# Patient Record
Sex: Female | Born: 1965 | Race: White | Hispanic: No | Marital: Single | Smoking: Current some day smoker
Health system: Southern US, Community
[De-identification: ages and names within clinical notes are randomized; demographics above are authoritative.]

## PROBLEM LIST (undated history)

## (undated) DIAGNOSIS — Z87442 Personal history of urinary calculi: Secondary | ICD-10-CM

## (undated) DIAGNOSIS — J449 Chronic obstructive pulmonary disease, unspecified: Secondary | ICD-10-CM

## (undated) DIAGNOSIS — E039 Hypothyroidism, unspecified: Secondary | ICD-10-CM

## (undated) DIAGNOSIS — F32A Depression, unspecified: Secondary | ICD-10-CM

## (undated) DIAGNOSIS — F329 Major depressive disorder, single episode, unspecified: Secondary | ICD-10-CM

## (undated) DIAGNOSIS — T8859XA Other complications of anesthesia, initial encounter: Secondary | ICD-10-CM

## (undated) DIAGNOSIS — Z780 Asymptomatic menopausal state: Secondary | ICD-10-CM

## (undated) DIAGNOSIS — D649 Anemia, unspecified: Secondary | ICD-10-CM

## (undated) DIAGNOSIS — T4145XA Adverse effect of unspecified anesthetic, initial encounter: Secondary | ICD-10-CM

## (undated) DIAGNOSIS — S060X9A Concussion with loss of consciousness of unspecified duration, initial encounter: Secondary | ICD-10-CM

## (undated) DIAGNOSIS — R51 Headache: Secondary | ICD-10-CM

## (undated) DIAGNOSIS — Z8719 Personal history of other diseases of the digestive system: Secondary | ICD-10-CM

## (undated) DIAGNOSIS — G459 Transient cerebral ischemic attack, unspecified: Secondary | ICD-10-CM

## (undated) DIAGNOSIS — M25559 Pain in unspecified hip: Secondary | ICD-10-CM

## (undated) DIAGNOSIS — N83209 Unspecified ovarian cyst, unspecified side: Secondary | ICD-10-CM

## (undated) DIAGNOSIS — K219 Gastro-esophageal reflux disease without esophagitis: Secondary | ICD-10-CM

## (undated) DIAGNOSIS — M1990 Unspecified osteoarthritis, unspecified site: Secondary | ICD-10-CM

## (undated) DIAGNOSIS — S060X1A Concussion with loss of consciousness of 30 minutes or less, initial encounter: Secondary | ICD-10-CM

## (undated) DIAGNOSIS — G4710 Hypersomnia, unspecified: Principal | ICD-10-CM

## (undated) DIAGNOSIS — S92909A Unspecified fracture of unspecified foot, initial encounter for closed fracture: Secondary | ICD-10-CM

## (undated) DIAGNOSIS — R413 Other amnesia: Secondary | ICD-10-CM

## (undated) HISTORY — PX: APPENDECTOMY: SHX54

## (undated) HISTORY — DX: Unspecified ovarian cyst, unspecified side: N83.209

## (undated) HISTORY — DX: Chronic obstructive pulmonary disease, unspecified: J44.9

## (undated) HISTORY — DX: Hypersomnia, unspecified: G47.10

## (undated) HISTORY — DX: Concussion with loss of consciousness of unspecified duration, initial encounter: S06.0X9A

## (undated) HISTORY — DX: Asymptomatic menopausal state: Z78.0

## (undated) HISTORY — PX: HEMORROIDECTOMY: SUR656

## (undated) HISTORY — DX: Concussion with loss of consciousness of 30 minutes or less, initial encounter: S06.0X1A

## (undated) HISTORY — DX: Pain in unspecified hip: M25.559

## (undated) HISTORY — DX: Other amnesia: R41.3

## (undated) HISTORY — PX: ABDOMINAL SURGERY: SHX537

## (undated) HISTORY — DX: Unspecified fracture of unspecified foot, initial encounter for closed fracture: S92.909A

---
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
## 1998-01-07 ENCOUNTER — Inpatient Hospital Stay (HOSPITAL_COMMUNITY): Admission: AD | Admit: 1998-01-07 | Discharge: 1998-01-07 | Payer: Self-pay | Admitting: Obstetrics & Gynecology

## 1998-08-07 ENCOUNTER — Ambulatory Visit (HOSPITAL_COMMUNITY): Admission: RE | Admit: 1998-08-07 | Discharge: 1998-08-07 | Payer: Self-pay | Admitting: *Deleted

## 1999-12-21 ENCOUNTER — Inpatient Hospital Stay (HOSPITAL_COMMUNITY): Admission: AD | Admit: 1999-12-21 | Discharge: 1999-12-21 | Payer: Self-pay | Admitting: *Deleted

## 1999-12-21 ENCOUNTER — Encounter: Payer: Self-pay | Admitting: *Deleted

## 2000-01-11 ENCOUNTER — Other Ambulatory Visit: Admission: RE | Admit: 2000-01-11 | Discharge: 2000-01-11 | Payer: Self-pay | Admitting: *Deleted

## 2000-02-21 ENCOUNTER — Ambulatory Visit (HOSPITAL_COMMUNITY): Admission: RE | Admit: 2000-02-21 | Discharge: 2000-02-21 | Payer: Self-pay | Admitting: Obstetrics and Gynecology

## 2000-02-21 ENCOUNTER — Encounter: Payer: Self-pay | Admitting: Obstetrics and Gynecology

## 2000-03-22 ENCOUNTER — Inpatient Hospital Stay (HOSPITAL_COMMUNITY): Admission: RE | Admit: 2000-03-22 | Discharge: 2000-03-22 | Payer: Self-pay | Admitting: Obstetrics and Gynecology

## 2001-03-15 ENCOUNTER — Encounter: Payer: Self-pay | Admitting: Internal Medicine

## 2001-03-15 ENCOUNTER — Encounter: Admission: RE | Admit: 2001-03-15 | Discharge: 2001-03-15 | Payer: Self-pay | Admitting: Internal Medicine

## 2001-04-29 ENCOUNTER — Emergency Department (HOSPITAL_COMMUNITY): Admission: EM | Admit: 2001-04-29 | Discharge: 2001-04-29 | Payer: Self-pay | Admitting: Emergency Medicine

## 2002-04-12 ENCOUNTER — Other Ambulatory Visit: Admission: RE | Admit: 2002-04-12 | Discharge: 2002-04-12 | Payer: Self-pay | Admitting: Obstetrics and Gynecology

## 2002-04-19 ENCOUNTER — Encounter: Payer: Self-pay | Admitting: Obstetrics and Gynecology

## 2002-04-19 ENCOUNTER — Ambulatory Visit (HOSPITAL_COMMUNITY): Admission: RE | Admit: 2002-04-19 | Discharge: 2002-04-19 | Payer: Self-pay | Admitting: Obstetrics and Gynecology

## 2002-05-31 ENCOUNTER — Ambulatory Visit (HOSPITAL_COMMUNITY): Admission: RE | Admit: 2002-05-31 | Discharge: 2002-05-31 | Payer: Self-pay | Admitting: Obstetrics and Gynecology

## 2002-05-31 ENCOUNTER — Encounter: Payer: Self-pay | Admitting: Obstetrics and Gynecology

## 2002-07-26 ENCOUNTER — Ambulatory Visit (HOSPITAL_COMMUNITY): Admission: RE | Admit: 2002-07-26 | Discharge: 2002-07-26 | Payer: Self-pay | Admitting: Obstetrics and Gynecology

## 2002-07-26 ENCOUNTER — Encounter: Payer: Self-pay | Admitting: Obstetrics and Gynecology

## 2003-08-20 ENCOUNTER — Other Ambulatory Visit: Admission: RE | Admit: 2003-08-20 | Discharge: 2003-08-20 | Payer: Self-pay | Admitting: Obstetrics and Gynecology

## 2004-08-27 ENCOUNTER — Other Ambulatory Visit: Admission: RE | Admit: 2004-08-27 | Discharge: 2004-08-27 | Payer: Self-pay | Admitting: Obstetrics and Gynecology

## 2005-09-15 ENCOUNTER — Other Ambulatory Visit: Admission: RE | Admit: 2005-09-15 | Discharge: 2005-09-15 | Payer: Self-pay | Admitting: Obstetrics and Gynecology

## 2006-03-03 ENCOUNTER — Encounter: Admission: RE | Admit: 2006-03-03 | Discharge: 2006-03-03 | Payer: Self-pay | Admitting: Internal Medicine

## 2006-07-10 ENCOUNTER — Encounter: Admission: RE | Admit: 2006-07-10 | Discharge: 2006-07-10 | Payer: Self-pay | Admitting: Gastroenterology

## 2006-09-26 ENCOUNTER — Other Ambulatory Visit: Admission: RE | Admit: 2006-09-26 | Discharge: 2006-09-26 | Payer: Self-pay | Admitting: Obstetrics and Gynecology

## 2007-11-16 ENCOUNTER — Other Ambulatory Visit: Admission: RE | Admit: 2007-11-16 | Discharge: 2007-11-16 | Payer: Self-pay | Admitting: Obstetrics and Gynecology

## 2007-11-26 ENCOUNTER — Encounter: Admission: RE | Admit: 2007-11-26 | Discharge: 2007-11-26 | Payer: Self-pay | Admitting: Obstetrics and Gynecology

## 2008-12-15 ENCOUNTER — Other Ambulatory Visit: Admission: RE | Admit: 2008-12-15 | Discharge: 2008-12-15 | Payer: Self-pay | Admitting: Obstetrics and Gynecology

## 2008-12-22 ENCOUNTER — Encounter: Admission: RE | Admit: 2008-12-22 | Discharge: 2008-12-22 | Payer: Self-pay | Admitting: Obstetrics and Gynecology

## 2009-12-17 ENCOUNTER — Encounter: Admission: RE | Admit: 2009-12-17 | Discharge: 2009-12-17 | Payer: Self-pay | Admitting: Chiropractic Medicine

## 2009-12-17 ENCOUNTER — Other Ambulatory Visit: Admission: RE | Admit: 2009-12-17 | Discharge: 2009-12-17 | Payer: Self-pay | Admitting: Obstetrics and Gynecology

## 2009-12-23 ENCOUNTER — Encounter: Admission: RE | Admit: 2009-12-23 | Discharge: 2009-12-23 | Payer: Self-pay | Admitting: Obstetrics and Gynecology

## 2010-06-07 ENCOUNTER — Ambulatory Visit (HOSPITAL_COMMUNITY): Admission: RE | Admit: 2010-06-07 | Discharge: 2010-06-07 | Payer: Self-pay | Admitting: Gastroenterology

## 2010-08-01 ENCOUNTER — Encounter: Payer: Self-pay | Admitting: Obstetrics and Gynecology

## 2011-03-01 ENCOUNTER — Other Ambulatory Visit: Payer: Self-pay | Admitting: Internal Medicine

## 2011-03-01 DIAGNOSIS — Z1231 Encounter for screening mammogram for malignant neoplasm of breast: Secondary | ICD-10-CM

## 2011-03-08 ENCOUNTER — Other Ambulatory Visit: Payer: Self-pay | Admitting: Obstetrics and Gynecology

## 2011-03-09 ENCOUNTER — Ambulatory Visit
Admission: RE | Admit: 2011-03-09 | Discharge: 2011-03-09 | Disposition: A | Discharge status: Home / Self Care | Payer: BC Managed Care – PPO | Source: Ambulatory Visit | Attending: Internal Medicine | Admitting: Internal Medicine

## 2011-03-09 DIAGNOSIS — Z1231 Encounter for screening mammogram for malignant neoplasm of breast: Secondary | ICD-10-CM

## 2012-03-20 ENCOUNTER — Emergency Department (HOSPITAL_COMMUNITY)
Admission: EM | Admit: 2012-03-20 | Discharge: 2012-03-20 | Disposition: A | Discharge status: Home / Self Care | Payer: Managed Care, Other (non HMO) | Attending: Emergency Medicine | Admitting: Emergency Medicine

## 2012-03-20 ENCOUNTER — Encounter (HOSPITAL_COMMUNITY): Payer: Self-pay

## 2012-03-20 ENCOUNTER — Emergency Department (HOSPITAL_COMMUNITY): Payer: Managed Care, Other (non HMO)

## 2012-03-20 ENCOUNTER — Encounter (HOSPITAL_COMMUNITY): Payer: Self-pay | Admitting: *Deleted

## 2012-03-20 ENCOUNTER — Emergency Department (HOSPITAL_COMMUNITY)
Admission: EM | Admit: 2012-03-20 | Discharge: 2012-03-20 | Disposition: A | Discharge status: Nursing Home (Includes ICF) | Payer: Managed Care, Other (non HMO) | Source: Home / Self Care | Attending: Family Medicine | Admitting: Family Medicine

## 2012-03-20 DIAGNOSIS — Z23 Encounter for immunization: Secondary | ICD-10-CM | POA: Insufficient documentation

## 2012-03-20 DIAGNOSIS — K219 Gastro-esophageal reflux disease without esophagitis: Secondary | ICD-10-CM | POA: Insufficient documentation

## 2012-03-20 DIAGNOSIS — F329 Major depressive disorder, single episode, unspecified: Secondary | ICD-10-CM | POA: Insufficient documentation

## 2012-03-20 DIAGNOSIS — S060X9A Concussion with loss of consciousness of unspecified duration, initial encounter: Secondary | ICD-10-CM

## 2012-03-20 DIAGNOSIS — F17200 Nicotine dependence, unspecified, uncomplicated: Secondary | ICD-10-CM | POA: Insufficient documentation

## 2012-03-20 DIAGNOSIS — S9030XA Contusion of unspecified foot, initial encounter: Secondary | ICD-10-CM | POA: Insufficient documentation

## 2012-03-20 DIAGNOSIS — Y92009 Unspecified place in unspecified non-institutional (private) residence as the place of occurrence of the external cause: Secondary | ICD-10-CM | POA: Insufficient documentation

## 2012-03-20 DIAGNOSIS — R51 Headache: Secondary | ICD-10-CM | POA: Insufficient documentation

## 2012-03-20 DIAGNOSIS — M25579 Pain in unspecified ankle and joints of unspecified foot: Secondary | ICD-10-CM | POA: Insufficient documentation

## 2012-03-20 DIAGNOSIS — S0083XA Contusion of other part of head, initial encounter: Secondary | ICD-10-CM

## 2012-03-20 DIAGNOSIS — W19XXXA Unspecified fall, initial encounter: Secondary | ICD-10-CM | POA: Insufficient documentation

## 2012-03-20 DIAGNOSIS — S0003XA Contusion of scalp, initial encounter: Secondary | ICD-10-CM | POA: Insufficient documentation

## 2012-03-20 DIAGNOSIS — F3289 Other specified depressive episodes: Secondary | ICD-10-CM | POA: Insufficient documentation

## 2012-03-20 DIAGNOSIS — E079 Disorder of thyroid, unspecified: Secondary | ICD-10-CM | POA: Insufficient documentation

## 2012-03-20 HISTORY — DX: Gastro-esophageal reflux disease without esophagitis: K21.9

## 2012-03-20 HISTORY — DX: Depression, unspecified: F32.A

## 2012-03-20 HISTORY — DX: Major depressive disorder, single episode, unspecified: F32.9

## 2012-03-20 MED ORDER — TETANUS-DIPHTH-ACELL PERTUSSIS 5-2.5-18.5 LF-MCG/0.5 IM SUSP
0.5000 mL | Freq: Once | INTRAMUSCULAR | Status: AC
  Administered 2012-03-20: 0.5 mL via INTRAMUSCULAR
  Filled 2012-03-20: qty 0.5

## 2012-03-20 NOTE — ED Notes (Signed)
Registration associate requested assessment of patient presenting with c/o possible concussion. Pt reports that she may have been somnambulant last Friday night when she believes that she fell in her kitchen and banging her face on a kitchen countertop, chipping a tooth in the process. Pt reports having felt "a little dizzy and disoriented" since then, but "worse this morning." Pt states that she was at her dentists office to address the chipped tooth, but was told by her dentist that there was a suspicion of the pt having suffered a concussion.  Pt also report that her dentist did not want to perform any procedure on her in her current state, and it was recommended that she "get checked." Pt's vital are stable at the time of this assessment.

## 2012-03-20 NOTE — Progress Notes (Signed)
Orthopedic Tech Progress Note Patient Details:  Monica Jensen 1965-11-05 [id number]  Ortho Devices Type of Ortho Device: Crutches;Postop boot Ortho Device/Splint Location: left foot Ortho Device/Splint Interventions: Application   Nikki Dom 03/20/2012, 5:36 PM

## 2012-03-20 NOTE — ED Notes (Signed)
Ortho at bedside.

## 2012-03-20 NOTE — ED Provider Notes (Signed)
History  This chart was scribed for [name], MD by Ladona Ridgel Day. This patient was seen in room TR04C/TR04C and the patient's care was started at 1412.   CSN: [id number]  Arrival date & time 03/20/12  1412   First MD Initiated Contact with Patient 03/20/12 1526      Chief Complaint  Patient presents with  . Fall  . Headache  . Foot Pain   Patient is a 46 y.o. female presenting with fall. The history is provided by the patient. No language interpreter was used.  Fall The accident occurred more than 2 days ago. The fall occurred while walking. She fell from an unknown height. She landed on a hard floor. There was no blood loss. The point of impact was the head. The pain is present in the head (right and left foot). The pain is moderate. She was ambulatory at the scene. There was no entrapment after the fall. Drug use: was taking ambien for sleeping. Pertinent negatives include no visual change, no fever, no abdominal pain, no nausea and no vomiting. The symptoms are aggravated by standing. She has tried nothing for the symptoms.   Monica Jensen is a 46 y.o. female who presents to the Emergency Department complaining of a fall 4 days ago while she was out of town and fell during the night while she was on [place] hitting her face and was found on the floor. She is unsure if she fell from height or the mechanism of her fall. She states headaches and abrasion above her right eye, chipping her front teeth, and left foot pain. She states that she has a history of sleep walking while taking ambien.    Past Medical History  Diagnosis Date  . Thyroid disease   . Depression   . GERD (gastroesophageal reflux disease)     Past Surgical History  Procedure Date  . Abdominal surgery   . Cesarean section   . Colon surgery     History reviewed. No pertinent family history.  History  Substance Use Topics  . Smoking status: Current Some Day Smoker  . Smokeless tobacco: Not on file  .  Alcohol Use: Yes    OB History    Grav Para Term Preterm Abortions TAB SAB Ect Mult Living                  Review of Systems  Constitutional: Negative for fever and chills.  Respiratory: Negative for shortness of breath.   Gastrointestinal: Negative for nausea, vomiting and abdominal pain.  Musculoskeletal: Negative for back pain.       Painful left and right foot  Skin:       Abrasion above her left eye.   Neurological: Negative for weakness.  All other systems reviewed and are negative.    Allergies  Penicillins  Home Medications   Current Outpatient Rx  Name Route Sig Dispense Refill  . CELEXA PO Oral Take 30 mg by mouth daily.    Marland Kitchen LEVOTHYROXINE SODIUM 75 MCG PO TABS Oral Take 75 mcg by mouth daily.    Marland Kitchen LITHIUM CARBONATE 300 MG PO TABS Oral Take 300 mg by mouth daily.    Marland Kitchen PANTOPRAZOLE SODIUM 20 MG PO TBEC Oral Take 20 mg by mouth daily.    Marland Kitchen ZOLPIDEM TARTRATE 10 MG PO TABS Oral Take 10 mg by mouth at bedtime as needed. For sleep      Triage vitals: BP 123/86  Pulse 70  Temp 98  F (36.7 C) (Oral)  Resp 16  SpO2 100%  LMP 02/27/2012  Physical Exam  Nursing note and vitals reviewed. Constitutional: She is oriented to person, place, and time. She appears well-developed and well-nourished. No distress.  HENT:  Head: Normocephalic and atraumatic.       Contusion medial aspect left orbit. 2 broken front teeth. No lac of lips.   Eyes: EOM are normal.  Neck: Neck supple. No tracheal deviation present.  Cardiovascular: Normal rate.   Pulmonary/Chest: Effort normal. No respiratory distress.  Musculoskeletal: Normal range of motion.       Bruised/contused and tender at base 5th metatarsal. Diffusely discolored c.w. contusion dorsal aspect bilateral feet.   Neurological: She is alert and oriented to person, place, and time.  Skin: Skin is warm and dry.  Psychiatric: She has a normal mood and affect. Her behavior is normal.    ED Course  Procedures (including  critical care time) DIAGNOSTIC STUDIES: Oxygen Saturation is 100% on room air, normal by my interpretation.    COORDINATION OF CARE: At 350 PM Discussed treatment plan with patient which includes C-spine and right foot X-Maude Hettich. Patient agrees.   455 PM Informed patient of negative right foot X-Chaysen Tillman and plan to discharge home and use ibuprofen and or Tylenol for pain. Patient understands and agrees.   Labs Reviewed - No data to display Ct Head Wo Contrast  03/20/2012  *RADIOLOGY REPORT*  Clinical Data: Status post fall with headaches  CT HEAD WITHOUT CONTRAST  Technique:  Contiguous axial images were obtained from the base of the skull through the vertex without contrast.  Comparison: None.  Findings: There is no midline shift, hydrocephalus, or mass effect. No acute hemorrhage or acute transcortical infarct is identified. The bony calvarium is intact.  The visualized sinuses are clear.  IMPRESSION: No focal acute intracranial abnormality identified.   Original Report Authenticated By: Sherian Rein, M.D.    Dg Foot Complete Left  03/20/2012  *RADIOLOGY REPORT*  Clinical Data: Status post fall with foot pain  LEFT FOOT - COMPLETE 3+ VIEW  Comparison: None.  Findings: There is no acute fracture or dislocation.  The soft tissues are normal.  IMPRESSION: No acute fracture or dislocation.   Original Report Authenticated By: Sherian Rein, M.D.      No diagnosis found.    MDM  I personally performed the services described in this documentation, which was scribed in my presence. The recorded information has been reviewed and considered.         [name], MD 03/20/12 [phone]

## 2012-03-20 NOTE — ED Notes (Signed)
Pt states she took [place] on Friday night.  States her friends found her in the kitchen floor and returned her to bed- she does not remember this.  She was out of town on vacation. States she has done this several times before when she has taken the [place].  C/o bruising and swelling to bridge of nose and knot above rt eyebrow, as well as headache, foggy thinking, fatigue and chipped front teeth.  Reports she is also having pain, bruising and swelling to rt foot.  No one witnessed the injuries.  She states she has been feeling worse since she returned home on Sunday.  Went to dentist today to have teeth repaired, dentist would not do anything until she is evaluated medically.

## 2012-03-20 NOTE — ED Notes (Signed)
Pain left foot 8/10 achy sharp and left foot 4/10 achy soreness with +1 swelling right foot. Pedal pulses +2 bilateral able to move all toes right foot and left foot able to move greater toe only.  Full sensation.

## 2012-03-20 NOTE — ED Notes (Addendum)
Pt reports on Friday night she took [place] and fell hitting face, front teeth chipped. Pt reports hx of sleep walking with ambien. Pt states she was at dentist and he told her she needed to get checked out before he would fix her teeth. Pt states she must of passed out after hitting the counter top. Pt report left foot pain and headaches. Pt sent over from urgent care for CT of head and foot xray. PERRLA. GCS 15.

## 2012-03-20 NOTE — ED Provider Notes (Addendum)
History     CSN: [id number]  Arrival date & time 03/20/12  1237   First MD Initiated Contact with Patient 03/20/12 1322      Chief Complaint  Patient presents with  . Head Injury  . Ankle Pain    (Consider location/radiation/quality/duration/timing/severity/associated sxs/prior treatment) Patient is a 46 y.o. female presenting with head injury and ankle pain. The history is provided by the patient.  Head Injury  The incident occurred more than 2 days ago (on fri night took an [place] for sleep, had sleep walking episode, fell, struck face, broke 2 teeth, had loc for possible 2 hrs,  bilat foot injury, thoughts not clear since, no vomiting, has fallen taking ambien 2 times prev.). She came to the ER via walk-in. The injury mechanism was a direct blow. She lost consciousness for a period of greater than 5 minutes. There was no blood loss. The pain is moderate.  Ankle Pain  The incident occurred more than 2 days ago. Incident location: out of town on vacation. The injury mechanism was a fall. The pain is present in the right foot and left foot.    Past Medical History  Diagnosis Date  . Thyroid disease   . Depression   . GERD (gastroesophageal reflux disease)     Past Surgical History  Procedure Date  . Abdominal surgery   . Cesarean section   . Colon surgery     No family history on file.  History  Substance Use Topics  . Smoking status: Current Some Day Smoker  . Smokeless tobacco: Not on file  . Alcohol Use: Yes    OB History    Grav Para Term Preterm Abortions TAB SAB Ect Mult Living                  Review of Systems  Constitutional: Negative.   HENT: Negative for neck pain.   Cardiovascular: Negative for chest pain.  Gastrointestinal: Negative.   Neurological: Positive for syncope and headaches. Negative for seizures and speech difficulty.    Allergies  Penicillins  Home Medications   Current Outpatient Rx  Name Route Sig Dispense Refill  . CELEXA  PO Oral Take 30 mg by mouth daily.    Marland Kitchen LEVOTHYROXINE SODIUM 75 MCG PO TABS Oral Take 75 mcg by mouth daily.    Marland Kitchen LITHIUM CARBONATE 300 MG PO TABS Oral Take 300 mg by mouth daily.    Marland Kitchen PANTOPRAZOLE SODIUM 20 MG PO TBEC Oral Take 20 mg by mouth daily.    . AMBIEN PO Oral Take by mouth.      BP 138/83  Pulse 75  Temp 99.6 F (37.6 C) (Oral)  Resp 19  SpO2 100%  LMP 02/27/2012  Physical Exam  Nursing note and vitals reviewed. Constitutional: She appears well-developed and well-nourished.  HENT:  Head: Normocephalic.  Right Ear: External ear normal.  Left Ear: External ear normal.  Mouth/Throat: Mucous membranes are normal.    Musculoskeletal: She exhibits tenderness.       Feet:    ED Course  Procedures (including critical care time)  Labs Reviewed - No data to display No results found.   1. Concussion with loss of consciousness for 1-24 hours       MDM          Linna Hoff, MD 03/20/12 1343  Linna Hoff, MD 03/20/12 1344

## 2012-06-08 ENCOUNTER — Emergency Department (HOSPITAL_COMMUNITY)
Admission: EM | Admit: 2012-06-08 | Discharge: 2012-06-09 | Disposition: A | Discharge status: Home / Self Care | Payer: Managed Care, Other (non HMO) | Attending: Emergency Medicine | Admitting: Emergency Medicine

## 2012-06-08 ENCOUNTER — Encounter (HOSPITAL_COMMUNITY): Payer: Self-pay | Admitting: Emergency Medicine

## 2012-06-08 DIAGNOSIS — Z8659 Personal history of other mental and behavioral disorders: Secondary | ICD-10-CM | POA: Insufficient documentation

## 2012-06-08 DIAGNOSIS — E079 Disorder of thyroid, unspecified: Secondary | ICD-10-CM | POA: Insufficient documentation

## 2012-06-08 DIAGNOSIS — Y929 Unspecified place or not applicable: Secondary | ICD-10-CM | POA: Insufficient documentation

## 2012-06-08 DIAGNOSIS — Y939 Activity, unspecified: Secondary | ICD-10-CM | POA: Insufficient documentation

## 2012-06-08 DIAGNOSIS — F10929 Alcohol use, unspecified with intoxication, unspecified: Secondary | ICD-10-CM

## 2012-06-08 DIAGNOSIS — R110 Nausea: Secondary | ICD-10-CM | POA: Insufficient documentation

## 2012-06-08 DIAGNOSIS — K219 Gastro-esophageal reflux disease without esophagitis: Secondary | ICD-10-CM | POA: Insufficient documentation

## 2012-06-08 DIAGNOSIS — W06XXXA Fall from bed, initial encounter: Secondary | ICD-10-CM | POA: Insufficient documentation

## 2012-06-08 DIAGNOSIS — R51 Headache: Secondary | ICD-10-CM | POA: Insufficient documentation

## 2012-06-08 DIAGNOSIS — R519 Headache, unspecified: Secondary | ICD-10-CM

## 2012-06-08 DIAGNOSIS — Z79899 Other long term (current) drug therapy: Secondary | ICD-10-CM | POA: Insufficient documentation

## 2012-06-08 DIAGNOSIS — F10229 Alcohol dependence with intoxication, unspecified: Secondary | ICD-10-CM | POA: Insufficient documentation

## 2012-06-08 LAB — URINALYSIS, ROUTINE W REFLEX MICROSCOPIC
Nitrite: NEGATIVE
Protein, ur: NEGATIVE mg/dL
Urobilinogen, UA: 0.2 mg/dL (ref 0.0–1.0)

## 2012-06-08 LAB — COMPREHENSIVE METABOLIC PANEL
ALT: 7 U/L (ref 0–35)
AST: 29 U/L (ref 0–37)
Albumin: 3.9 g/dL (ref 3.5–5.2)
BUN: 8 mg/dL (ref 6–23)
Calcium: 9.3 mg/dL (ref 8.4–10.5)
Chloride: 105 mEq/L (ref 96–112)
GFR calc Af Amer: >90 mL/min (ref >90)
GFR calc non Af Amer: 83 mL/min — ABNORMAL LOW (ref >90)
Glucose, Bld: 80 mg/dL (ref 70–99)
Total Bilirubin: 0.3 mg/dL (ref 0.3–1.2)

## 2012-06-08 LAB — CBC WITH DIFFERENTIAL/PLATELET
Hemoglobin: 14 g/dL (ref 12.0–15.0)
MCH: 30.7 pg (ref 26.0–34.0)
MCHC: 34.1 g/dL (ref 30.0–36.0)
MCV: 89.9 fL (ref 78.0–100.0)

## 2012-06-08 LAB — ETHANOL: Alcohol, Ethyl (B): 84 mg/dL — ABNORMAL HIGH (ref 0–11)

## 2012-06-08 LAB — POCT PREGNANCY, URINE: Preg Test, Ur: NEGATIVE

## 2012-06-08 MED ORDER — SODIUM CHLORIDE 0.9 % IV BOLUS (SEPSIS)
1000.0000 mL | Freq: Once | INTRAVENOUS | Status: AC
  Administered 2012-06-08: 1000 mL via INTRAVENOUS

## 2012-06-08 MED ORDER — KETOROLAC TROMETHAMINE 30 MG/ML IJ SOLN
30.0000 mg | Freq: Once | INTRAMUSCULAR | Status: AC
  Administered 2012-06-08: 30 mg via INTRAVENOUS
  Filled 2012-06-08: qty 1

## 2012-06-08 MED ORDER — SODIUM CHLORIDE 0.9 % IV SOLN
Freq: Once | INTRAVENOUS | Status: AC
  Administered 2012-06-08: via INTRAVENOUS

## 2012-06-08 MED ORDER — ACETAMINOPHEN 325 MG PO TABS
650.0000 mg | ORAL_TABLET | Freq: Once | ORAL | Status: AC
  Administered 2012-06-08: 650 mg via ORAL
  Filled 2012-06-08: qty 2

## 2012-06-08 NOTE — ED Notes (Addendum)
PT. FELL THIS MORNING AT HOME WHILE TRYING TO GET UP FROM BED TO GO TO BATHROOM  ,  DRANK ETOH LAST NIGHT , STATES SLEPT ALL DAY TODAY .ALERT AND ORIENTED , STATES HEADACHE  " SHAKY" WITH NAUSEA. SPEECH  CLEAR , NO FACIAL ASYMMETRY  , RESPIRATIONS UNLABORED.

## 2012-06-08 NOTE — ED Notes (Signed)
Lisette, PA in with patient.

## 2012-06-08 NOTE — ED Provider Notes (Signed)
History     CSN: [id number]  Arrival date & time 06/08/12  2101   First MD Initiated Contact with Patient 06/08/12 2243      Chief Complaint  Patient presents with  . Fall    (Consider location/radiation/quality/duration/timing/severity/associated sxs/prior treatment) HPI Comments: The 46 year old female with no significant past medical history presents emergency department complaining of headache.  Patient reports that last night she was drinking heavily and fell this morning when trying to go to restroom. Pt denies LOC and is unaware of hitting her head. Pt is not on blood thinners. Fall was onto carpeted floor from bed of about 3 feet. Headache described as a throbbing sensation with a severity of 8/10.  Today she has been sleeping until 630 and woke up with a headache.  Patient denies any emesis, change in vision, disequilibrium, ataxia, weakness, slurred speech, musculoskeletal pain, or difficulty breathing.   The history is provided by the patient.    Past Medical History  Diagnosis Date  . Thyroid disease   . Depression   . GERD (gastroesophageal reflux disease)     Past Surgical History  Procedure Date  . Abdominal surgery   . Cesarean section   . Colon surgery     No family history on file.  History  Substance Use Topics  . Smoking status: Current Some Day Smoker  . Smokeless tobacco: Not on file  . Alcohol Use: Yes    OB History    Grav Para Term Preterm Abortions TAB SAB Ect Mult Living                  Review of Systems  Constitutional: Negative for fever, chills and appetite change.  HENT: Negative for congestion.   Eyes: Negative for visual disturbance.  Respiratory: Negative for shortness of breath.   Cardiovascular: Negative for chest pain and leg swelling.  Gastrointestinal: Positive for nausea. Negative for vomiting and abdominal pain.  Genitourinary: Negative for dysuria, urgency and frequency.  Neurological: Positive for headaches.  Negative for dizziness, syncope, facial asymmetry, weakness, light-headedness and numbness.  Psychiatric/Behavioral: Negative for confusion.  All other systems reviewed and are negative.    Allergies  Penicillins  Home Medications   Current Outpatient Rx  Name  Route  Sig  Dispense  Refill  . CELEXA PO   Oral   Take 30 mg by mouth daily.         Marland Kitchen LEVOTHYROXINE SODIUM 75 MCG PO TABS   Oral   Take 75 mcg by mouth daily.         Marland Kitchen LITHIUM CARBONATE 300 MG PO TABS   Oral   Take 300 mg by mouth daily.         Marland Kitchen PANTOPRAZOLE SODIUM 20 MG PO TBEC   Oral   Take 20 mg by mouth daily.         Marland Kitchen ZOLPIDEM TARTRATE 10 MG PO TABS   Oral   Take 10 mg by mouth at bedtime as needed. For sleep           BP 101/76  Pulse 69  Temp 99.5 F (37.5 C) (Oral)  Resp 20  SpO2 93%  LMP 04/26/2012  Physical Exam  Nursing note and vitals reviewed. Constitutional: She is oriented to person, place, and time. She appears well-developed and well-nourished. No distress.  HENT:  Head: Normocephalic and atraumatic.  Eyes: Conjunctivae normal and EOM are normal. Pupils are equal, round, and reactive to light. No scleral icterus.  Neck: Normal range of motion.       Neck supple with no nuchal rigidity, full pain free ROM. No carotid bruit  Cardiovascular: Normal rate, regular rhythm, normal heart sounds and intact distal pulses.   Pulmonary/Chest: Effort normal and breath sounds normal. No respiratory distress.  Musculoskeletal: Normal range of motion.  Neurological: She is alert and oriented to person, place, and time. She has normal strength.       CN III-XII intact, good coordination, normal gait, strength 5/5 bilaterally, intact distal sensation.  Skin: Skin is warm and dry. No rash noted. She is not diaphoretic.       No rash, non diaphoretic  Psychiatric: She has a normal mood and affect. Her behavior is normal.    ED Course  Procedures (including critical care time)  Labs  Reviewed  URINALYSIS, ROUTINE W REFLEX MICROSCOPIC - Abnormal; Notable for the following:    APPearance CLOUDY (*)     All other components within normal limits  ETHANOL - Abnormal; Notable for the following:    Alcohol, Ethyl (B) 84 (*)     All other components within normal limits  COMPREHENSIVE METABOLIC PANEL - Abnormal; Notable for the following:    GFR calc non Af Amer 83 (*)     All other components within normal limits  CBC WITH DIFFERENTIAL  POCT PREGNANCY, URINE   No results found.   No diagnosis found.    MDM  Alc intoxication HA  Pt HA treated and improved while in ED.  Presentation non concerning for [facility], ICH, Meningitis, or temporal arteritis. Pt is afebrile with no focal neuro deficits, nuchal rigidity, or change in vision. Pt is to follow up with PCP to discuss prophylactic medication. Pt verbalizes understanding and is agreeable with plan to dc. Pt seen w Dr. Ignacia Palma who agrees w plan.          Jaci Carrel, [place] 06/09/12 [phone]

## 2012-06-09 NOTE — ED Notes (Signed)
Patient ambulated around the department independently with a steady gait.

## 2012-06-09 NOTE — ED Provider Notes (Signed)
Medical screening examination/treatment/procedure(s) were conducted as a shared visit with non-physician practitioner(s) and myself.  I personally evaluated the patient during the encounter 46 yo woman drank heavily last night fell This AM.  Slept all day, woke up this evening with headache.  Exam benign.  Lab tests show ETOH still above 80.  Safe to be released,  Carleene Cooper III, MD 06/09/12 1027

## 2013-05-02 ENCOUNTER — Other Ambulatory Visit: Payer: Self-pay | Admitting: Obstetrics and Gynecology

## 2013-07-29 ENCOUNTER — Other Ambulatory Visit: Payer: Self-pay | Admitting: Gastroenterology

## 2013-08-05 ENCOUNTER — Encounter (HOSPITAL_COMMUNITY): Payer: Self-pay | Admitting: Pharmacy Technician

## 2013-08-15 ENCOUNTER — Encounter (HOSPITAL_COMMUNITY): Payer: Self-pay | Admitting: *Deleted

## 2013-08-30 ENCOUNTER — Other Ambulatory Visit: Payer: Self-pay | Admitting: Internal Medicine

## 2013-08-30 DIAGNOSIS — F0781 Postconcussional syndrome: Secondary | ICD-10-CM

## 2013-09-09 ENCOUNTER — Ambulatory Visit (INDEPENDENT_AMBULATORY_CARE_PROVIDER_SITE_OTHER): Payer: Managed Care, Other (non HMO) | Admitting: Neurology

## 2013-09-09 ENCOUNTER — Encounter: Payer: Self-pay | Admitting: Neurology

## 2013-09-09 ENCOUNTER — Encounter (INDEPENDENT_AMBULATORY_CARE_PROVIDER_SITE_OTHER): Payer: Self-pay

## 2013-09-09 VITALS — BP 108/72 | HR 67 | Resp 18 | Ht 63.5 in | Wt 110.0 lb

## 2013-09-09 DIAGNOSIS — G4710 Hypersomnia, unspecified: Secondary | ICD-10-CM

## 2013-09-09 DIAGNOSIS — F332 Major depressive disorder, recurrent severe without psychotic features: Secondary | ICD-10-CM

## 2013-09-09 DIAGNOSIS — F489 Nonpsychotic mental disorder, unspecified: Secondary | ICD-10-CM

## 2013-09-09 DIAGNOSIS — F5105 Insomnia due to other mental disorder: Secondary | ICD-10-CM

## 2013-09-09 HISTORY — DX: Hypersomnia, unspecified: G47.10

## 2013-09-09 NOTE — Patient Instructions (Signed)
iInsomnia Insomnia is frequent trouble falling and/or staying asleep. Insomnia can be a long term problem or a short term problem. Both are common. Insomnia can be a short term problem when the wakefulness is related to a certain stress or worry. Long term insomnia is often related to ongoing stress during waking hours and/or poor sleeping habits. Overtime, sleep deprivation itself can make the problem worse. Every little thing feels more severe because you are overtired and your ability to cope is decreased. CAUSES   Stress, anxiety, and depression.  Poor sleeping habits.  Distractions such as TV in the bedroom.  Naps close to bedtime.  Engaging in emotionally charged conversations before bed.  Technical reading before sleep.  Alcohol and other sedatives. They may make the problem worse. They can hurt normal sleep patterns and normal dream activity.  Stimulants such as caffeine for several hours prior to bedtime.  Pain syndromes and shortness of breath can cause insomnia.  Exercise late at night.  Changing time zones may cause sleeping problems (jet lag). It is sometimes helpful to have someone observe your sleeping patterns. They should look for periods of not breathing during the night (sleep apnea). They should also look to see how long those periods last. If you live alone or observers are uncertain, you can also be observed at a sleep clinic where your sleep patterns will be professionally monitored. Sleep apnea requires a checkup and treatment. Give your caregivers your medical history. Give your caregivers observations your family has made about your sleep.  SYMPTOMS   Not feeling rested in the morning.  Anxiety and restlessness at bedtime.  Difficulty falling and staying asleep. TREATMENT   Your caregiver may prescribe treatment for an underlying medical disorders. Your caregiver can give advice or help if you are using alcohol or other drugs for self-medication. Treatment  of underlying problems will usually eliminate insomnia problems.  Medications can be prescribed for short time use. They are generally not recommended for lengthy use.  Over-the-counter sleep medicines are not recommended for lengthy use. They can be habit forming.  You can promote easier sleeping by making lifestyle changes such as:  Using relaxation techniques that help with breathing and reduce muscle tension.  Exercising earlier in the day.  Changing your diet and the time of your last meal. No night time snacks.  Establish a regular time to go to bed.  Counseling can help with stressful problems and worry.  Soothing music and white noise may be helpful if there are background noises you cannot remove.  Stop tedious detailed work at least one hour before bedtime. HOME CARE INSTRUCTIONS   Keep a diary. Inform your caregiver about your progress. This includes any medication side effects. See your caregiver regularly. Take note of:  Times when you are asleep.  Times when you are awake during the night.  The quality of your sleep.  How you feel the next day. This information will help your caregiver care for you.  Get out of bed if you are still awake after 15 minutes. Read or do some quiet activity. Keep the lights down. Wait until you feel sleepy and go back to bed.  Keep regular sleeping and waking hours. Avoid naps.  Exercise regularly.  Avoid distractions at bedtime. Distractions include watching television or engaging in any intense or detailed activity like attempting to balance the household checkbook.  Develop a bedtime ritual. Keep a familiar routine of bathing, brushing your teeth, climbing into bed at the same  time each night, listening to soothing music. Routines increase the success of falling to sleep faster.  Use relaxation techniques. This can be using breathing and muscle tension release routines. It can also include visualizing peaceful scenes. You can  also help control troubling or intruding thoughts by keeping your mind occupied with boring or repetitive thoughts like the old concept of counting sheep. You can make it more creative like imagining planting one beautiful flower after another in your backyard garden.  During your day, work to eliminate stress. When this is not possible use some of the previous suggestions to help reduce the anxiety that accompanies stressful situations. MAKE SURE YOU:   Understand these instructions.  Will watch your condition.  Will get help right away if you are not doing well or get worse. Document Released: 06/24/2000 Document Revised: 09/19/2011 Document Reviewed: 07/25/2007 [employer] Patient Information 2014 [employer].  

## 2013-09-09 NOTE — Progress Notes (Signed)
[facility]  Provider:  Melvyn Jensen, M D  Referring Provider: Marden Noble, MD Primary Care Physician:  Monica Dubonnet, MD    HYPERSOMNIA for 6 month.   HPI:  Monica Jensen is a 48 y.o. female  Is seen here as a referral   from Dr. Kevan Jensen for evaluation of hypersomnia.  Steps, and a 33 year old Caucasian right-handed female is to be evaluated today for a complaint of hypersomnia. She also reports that she feels that her memory has been suffering that she is excessively fatigued and that she feels she has trouble to concentrate and pay attention. This all may be related to the hypersomnolence. About 6 months ago and she hasn't felt quite as sleepy as she feels now and she states that is very unusual for her to be sore fatigued and sore feeling slow and and heavy . She also states that she was always a very energetic person that many of her friends referred to as "the energizer bunny ".  She further reports that she had several concussions in the past, the last  one was January 2015 in a skiing accident. She lost briefly consciousness and she lost about 90 minutes of memory prior to the accident. Today's fatigue severity score is endorsed at 48 points , and her sleepiness score at 12 points.  A depression score was not obtained.   The patient is in psychiatric treatment with Dr. Dub Jensen , treated with Lithium. She ran out of her Lithium just 5 days ago and seems to show signs of early hypomania. She just lost her way a cuple of weeks ago on a familiar road , missed several exits and suffered a panic attack - she could not recall which exit to take to get home.   The patient's fiancee reports that she snores rarely. Ambien taken daily.  Goes to bed around midnight to 1 AM and rises at 7 AM . She only sleeps about 6 hours .She falls asleep in supine and wakes up prone.  She has had sleep initiation insomnia , family history in mother and maternal  grandmother , who both stayed up at 3 AM and slept until 7 AM.  Her Son ( 13 ) has ADHD , OCD and ODD. Sometimes, she  feels sleepy and wants to take a nap at her disk. She works for the [employer], training and education . She works some evenings and week ends. Most days 9 AM  to 5 PM. She travels for her job . Drives a lot.  She drinks excessive caffeine, 4 mugs  of coffee , mountain dew, no iced tea. She drinks alcohol, states she does not drink when she has her son in custody ( every other week end). Former smoker- from age 41 to 67. She is a long distance runner and reports no SOB. She has palpitations.    Review of Systems: Out of a complete 14 system review, the patient complains of only the following symptoms, and all other reviewed systems are negative.  Insomnia chronic, daytime EDS , Epworth 12 , FSS 48.  History   Social History  . Marital Status: Single    Spouse Name: N/A    Number of Children: 1  . Years of Education: College   Occupational History  .     Social History Main Topics  . Smoking status: Current Some Day Smoker    Types: Cigarettes  . Smokeless tobacco: Never Used  Comment: 1 pk per week-social  . Alcohol Use: Yes     Comment: social  . Drug Use: No  . Sexual Activity: Yes   Other Topics Concern  . Not on file   Social History Narrative   Patient lives with her fianceeLaure Jensen) and her son.   Patient has one child.   Patient has college education.   Patient works at [employer] (full-time).   Patient is right-handed.   Patient drinks four cups of caffeine per day.    Family History  Problem Relation Age of Onset  . Heart disease Maternal Grandfather   . Heart disease Maternal Grandmother   . Lung cancer Maternal Grandmother     Past Medical History  Diagnosis Date  . Thyroid disease   . Depression   . GERD (gastroesophageal reflux disease)   . Hypothyroidism   . Headache(784.0)     migraines  . Anemia   .  Complication of anesthesia     Epinephrine"tachycardia"-dentist  . History of Jensen stones     passed  . Concussion with brief LOC   . Memory loss   . 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 Fears memory loss. Questions  About  Recent memory, loss related to [place].     Past Surgical History  Procedure Laterality Date  . Abdominal surgery      x2(laparoscopic, 1 open)  . Cesarean section    . Colon surgery      "blockage"    Current Outpatient Prescriptions  Medication Sig Dispense Refill  . citalopram (CELEXA) 20 MG tablet Take 40 mg by mouth daily.      Marland Kitchen GAVILYTE-N WITH FLAVOR PACK 420 G solution       . hydrOXYzine (VISTARIL) 25 MG capsule       . ibuprofen (ADVIL,MOTRIN) 200 MG tablet Take 600 mg by mouth every 6 (six) hours as needed.      Marland Kitchen levothyroxine (SYNTHROID, LEVOTHROID) 88 MCG tablet Take 88 mcg by mouth daily before breakfast.      . lithium 300 MG tablet Take 300 mg by mouth every morning.       . pantoprazole (PROTONIX) 40 MG tablet Take 40 mg by mouth daily.      Marland Kitchen zolpidem (AMBIEN) 10 MG tablet Take 10 mg by mouth at bedtime as needed. For sleep       No current facility-administered medications for this visit.    Allergies as of 09/09/2013 - Review Complete 09/09/2013  Allergen Reaction Noted  . Penicillins Anaphylaxis 03/20/2012  . Cheese Other (See Comments) 08/05/2013  . Chocolate Other (See Comments) 08/05/2013  . Other Other (See Comments) 08/05/2013  . Epinephrine Palpitations 09/09/2013    Vitals: BP 108/72  Pulse 67  Resp 18  Ht 5' 3.5" (1.613 m)  Wt 110 lb (49.896 kg)  BMI 19.18 kg/m2  LMP 09/03/2013 Last Weight:  Wt Readings from Last 1 Encounters:  09/09/13 110 lb (49.896 kg)   Last Height:   Ht Readings from Last 1 Encounters:  09/09/13 5' 3.5" (1.613 m)     Physical exam:  General: The patient is awake, alert and appears not in  acute distress. The patient is well groomed. Head: Normocephalic, atraumatic. Neck is supple. Mallampati 2 , neck circumference: 13,  Cardiovascular:  Regular rate and rhythm, without  murmurs or carotid bruit, and without distended neck veins.  Respiratory: Lungs are clear to auscultation. Skin:  Without evidence of edema, or rash Trunk: BMI is elevated and patient  has normal posture.  Neurologic exam : The patient is awake and alert, oriented to place and time.  Memory subjective described as intact.  There is a normal attention span & concentration ability. Speech is fluent without  dysarthria, dysphonia or aphasia. Mood and affect are appropriate.  Cranial nerves: Pupils are equal and briskly reactive to light. Funduscopic exam without  evidence of pallor or edema. Extraocular movements  in vertical and horizontal planes intact and without nystagmus. Visual fields by finger perimetry are intact. Hearing to finger rub intact.  Facial sensation intact to fine touch. Facial motor strength is symmetric and tongue and uvula move midline.  Motor exam:   Normal tone and normal muscle bulk and symmetric normal strength in all extremities.  Sensory:  Fine touch, pinprick and vibration were tested in all extremities. Proprioception is intact .  Coordination: Rapid alternating movements in the fingers/hands is tested and normal. Finger-to-nose maneuver tested and normal without evidence of ataxia, dysmetria - mild  tremor.  Gait and station: Patient walks without assistive device . Strength within normal limits. Stance is stable and normal. Tandem gait is unfragmented. Romberg testing is normal.  Deep tendon reflexes: in the  upper and lower extremities are symmetric and intact. Babinski maneuver response is  downgoing.   Assessment:  After physical and neurologic examination, review of laboratory studies, imaging, neurophysiology testing and pre-existing records, assessment is   Poor sleep  hygiene.  Chronic insomnia , Ambien dependent . Sleep walking on Ambien.  Poor concentration and attention- needs psychiatrist to refer for ADD evaluation and needs refills of Lithium. She will see Dr. Lucianne MussKumar.  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 Patient has no reported risk factors for sleep apnea.       Plan:  Treatment plan and additional workup : The patient's main problems are non organic sleep disorders, she has been using [place] for decades.  i will recommend to return to psychiatry ASAP, resume Lithium until seen by psychiatry and undergo ADD testing.  There is anecdotal evidence of Aricept being helpful in post concussion syndrome. I recommend to use Trazodone or low dose Seroquel under guidance of a psychiatrist. .

## 2013-09-10 ENCOUNTER — Ambulatory Visit
Admission: RE | Admit: 2013-09-10 | Discharge: 2013-09-10 | Disposition: A | Discharge status: Home / Self Care | Payer: Managed Care, Other (non HMO) | Source: Ambulatory Visit | Attending: Internal Medicine | Admitting: Internal Medicine

## 2013-09-10 DIAGNOSIS — F0781 Postconcussional syndrome: Secondary | ICD-10-CM

## 2013-10-01 ENCOUNTER — Encounter (HOSPITAL_COMMUNITY): Payer: Self-pay | Admitting: Certified Registered Nurse Anesthetist

## 2013-10-01 ENCOUNTER — Ambulatory Visit (HOSPITAL_COMMUNITY)
Admission: RE | Admit: 2013-10-01 | Discharge: 2013-10-01 | Disposition: A | Discharge status: Home / Self Care | Payer: Managed Care, Other (non HMO) | Source: Ambulatory Visit | Attending: Gastroenterology | Admitting: Gastroenterology

## 2013-10-01 ENCOUNTER — Encounter (HOSPITAL_COMMUNITY)
Admission: RE | Disposition: A | Discharge status: Home / Self Care | Payer: Self-pay | Source: Ambulatory Visit | Attending: Gastroenterology

## 2013-10-01 ENCOUNTER — Ambulatory Visit (HOSPITAL_COMMUNITY): Payer: Managed Care, Other (non HMO) | Admitting: Certified Registered Nurse Anesthetist

## 2013-10-01 ENCOUNTER — Encounter (HOSPITAL_COMMUNITY): Payer: Managed Care, Other (non HMO) | Admitting: Certified Registered Nurse Anesthetist

## 2013-10-01 DIAGNOSIS — Z978 Presence of other specified devices: Secondary | ICD-10-CM | POA: Insufficient documentation

## 2013-10-01 DIAGNOSIS — K589 Irritable bowel syndrome without diarrhea: Secondary | ICD-10-CM | POA: Insufficient documentation

## 2013-10-01 DIAGNOSIS — E039 Hypothyroidism, unspecified: Secondary | ICD-10-CM | POA: Insufficient documentation

## 2013-10-01 DIAGNOSIS — F329 Major depressive disorder, single episode, unspecified: Secondary | ICD-10-CM | POA: Insufficient documentation

## 2013-10-01 DIAGNOSIS — F17200 Nicotine dependence, unspecified, uncomplicated: Secondary | ICD-10-CM | POA: Insufficient documentation

## 2013-10-01 DIAGNOSIS — G4700 Insomnia, unspecified: Secondary | ICD-10-CM | POA: Insufficient documentation

## 2013-10-01 DIAGNOSIS — F3289 Other specified depressive episodes: Secondary | ICD-10-CM | POA: Insufficient documentation

## 2013-10-01 DIAGNOSIS — Z87442 Personal history of urinary calculi: Secondary | ICD-10-CM | POA: Insufficient documentation

## 2013-10-01 DIAGNOSIS — D649 Anemia, unspecified: Secondary | ICD-10-CM | POA: Insufficient documentation

## 2013-10-01 DIAGNOSIS — K5900 Constipation, unspecified: Secondary | ICD-10-CM | POA: Insufficient documentation

## 2013-10-01 DIAGNOSIS — Z9089 Acquired absence of other organs: Secondary | ICD-10-CM | POA: Insufficient documentation

## 2013-10-01 DIAGNOSIS — K921 Melena: Secondary | ICD-10-CM | POA: Insufficient documentation

## 2013-10-01 DIAGNOSIS — K648 Other hemorrhoids: Secondary | ICD-10-CM | POA: Insufficient documentation

## 2013-10-01 DIAGNOSIS — K219 Gastro-esophageal reflux disease without esophagitis: Secondary | ICD-10-CM | POA: Insufficient documentation

## 2013-10-01 DIAGNOSIS — G43909 Migraine, unspecified, not intractable, without status migrainosus: Secondary | ICD-10-CM | POA: Insufficient documentation

## 2013-10-01 DIAGNOSIS — K5730 Diverticulosis of large intestine without perforation or abscess without bleeding: Secondary | ICD-10-CM | POA: Insufficient documentation

## 2013-10-01 DIAGNOSIS — J310 Chronic rhinitis: Secondary | ICD-10-CM | POA: Insufficient documentation

## 2013-10-01 HISTORY — DX: Adverse effect of unspecified anesthetic, initial encounter: T41.45XA

## 2013-10-01 HISTORY — DX: Hypothyroidism, unspecified: E03.9

## 2013-10-01 HISTORY — DX: Anemia, unspecified: D64.9

## 2013-10-01 HISTORY — DX: Headache: R51

## 2013-10-01 HISTORY — DX: Other complications of anesthesia, initial encounter: T88.59XA

## 2013-10-01 HISTORY — PX: COLONOSCOPY WITH PROPOFOL: SHX5780

## 2013-10-01 HISTORY — DX: Personal history of urinary calculi: Z87.442

## 2013-10-01 SURGERY — COLONOSCOPY WITH PROPOFOL
Anesthesia: Monitor Anesthesia Care

## 2013-10-01 MED ORDER — FENTANYL CITRATE 0.05 MG/ML IJ SOLN
INTRAMUSCULAR | Status: AC
  Filled 2013-10-01: qty 2

## 2013-10-01 MED ORDER — MIDAZOLAM HCL 5 MG/5ML IJ SOLN
INTRAMUSCULAR | Status: DC | PRN
  Administered 2013-10-01 (×2): 1 mg via INTRAVENOUS

## 2013-10-01 MED ORDER — SODIUM CHLORIDE 0.9 % IV SOLN: INTRAVENOUS | Status: DC

## 2013-10-01 MED ORDER — PROPOFOL 10 MG/ML IV BOLUS
INTRAVENOUS | Status: AC
Start: 2013-10-01 — End: 2013-10-01
  Filled 2013-10-01: qty 20

## 2013-10-01 MED ORDER — LACTATED RINGERS IV SOLN
INTRAVENOUS | Status: DC | PRN
  Administered 2013-10-01: 07:00:00 via INTRAVENOUS

## 2013-10-01 MED ORDER — MIDAZOLAM HCL 2 MG/2ML IJ SOLN
INTRAMUSCULAR | Status: AC
  Filled 2013-10-01: qty 2

## 2013-10-01 MED ORDER — PROPOFOL 10 MG/ML IV BOLUS
INTRAVENOUS | Status: AC
  Filled 2013-10-01: qty 20

## 2013-10-01 MED ORDER — PROPOFOL 10 MG/ML IV BOLUS
INTRAVENOUS | Status: DC | PRN
  Administered 2013-10-01: 40 mg via INTRAVENOUS
  Administered 2013-10-01 (×2): 20 mg via INTRAVENOUS
  Administered 2013-10-01 (×2): 40 mg via INTRAVENOUS

## 2013-10-01 SURGICAL SUPPLY — 21 items

## 2013-10-01 NOTE — H&P (Signed)
  Problem: Hematochezia  History: The patient is a 48 year old female born 01/29/1966. She has chronic anal rectal discomfort associated with intermittent hematochezia. She denies diarrhea and constipation.  In 2007, she underwent a normal incomplete screening colonoscopy followed by a normal virtual colonoscopy.  In November 2011, she underwent a normal flexible proctosigmoidoscopy.  The patient is scheduled to undergo a diagnostic colonoscopy using propofol sedation.  Past medical history: Gastroesophageal reflux. Irritable bowel syndrome. Insomnia. Depression. Migraine headache syndrome. Rhinitis. Kidney stones. Colonic diverticulosis. Appendectomy. Breast augmentation. Cesarean section. Fallopian tube removal. Exploratory abdominal surgery.  Allergies: Penicillin. Effexor causes severe depression. Ambien causes sleep walking.  Exam: The patient is alert and lying comfortably on the endoscopy stretcher. Cardiac exam reveals a regular rhythm. Lungs are clear to auscultation. Abdomen is soft and nontender to palpation.  Plan: Proceed with diagnostic colonoscopy to evaluate chronic anal rectal discomfort with intermittent hematochezia.

## 2013-10-01 NOTE — Op Note (Signed)
Problem: Chronic anorectal discomfort associated with intermittent hematochezia. 2007 normal incomplete diagnostic colonoscopy to 40 cm followed by a normal screening virtual colonoscopy. 2011 normal flexible proctosigmoidoscopy.  Endoscopist: Danise EdgeMartin Johnson  Premedication: Propofol administered by anesthesia  Procedure: Diagnostic colonoscopy The patient was placed in the left lateral decubitus position. Anal inspection and digital rectal exam were normal. The Pentax pediatric colonoscope was introduced into the rectum and advanced to the cecum. A normal-appearing appendiceal orifice and ileocecal valve were identified. Colonic preparation for the exam today was good.  Rectum. Normal. Retroflex view of the distal rectum showed moderate sized, nonbleeding internal hemorrhoids.  Sigmoid colon and descending colon. Normal  Splenic flexure. Normal  Transverse colon. Normal  Hepatic flexure. Normal  Ascending colon. Normal. Cecum and ileocecal valve. Normal  Assessment: Normal diagnostic proctocolonoscopy to the cecum except for the presence of moderate sized, nonbleeding internal hemorrhoids.  Recommendation: Schedule surgical consultation to treat moderate sized internal hemorrhoids which may be the source of her intermittent hematochezia

## 2013-10-01 NOTE — Discharge Instructions (Addendum)
Moderate Sedation, Adult °Moderate sedation is given to help you relax or even sleep through a procedure. You may remain sleepy, be clumsy, or have poor balance for several hours following this procedure. Arrange for a responsible adult, family member, or friend to take you home. A responsible adult should stay with you for at least 24 hours or until the medicines have worn off. °· Do not participate in any activities where you could become injured for the next 24 hours, or until you feel normal again. Do not: °· Drive. °· Swim. °· Ride a bicycle. °· Operate heavy machinery. °· Cook. °· Use power tools. °· Climb ladders. °· Work at heights. °· Do not make important decisions or sign legal documents until you are improved. °· Vomiting may occur if you eat too soon. When you can drink without vomiting, try water, juice, or soup. Try solid foods if you feel little or no nausea. °· Only take over-the-counter or prescription medications for pain, discomfort, or fever as directed by your caregiver.If pain medications have been prescribed for you, ask your caregiver how soon it is safe to take them. °· Make sure you and your family fully understands everything about the medication given to you. Make sure you understand what side effects may occur. °· You should not drink alcohol, take sleeping pills, or medications that cause drowsiness for at least 24 hours. °· If you smoke, do not smoke alone. °· If you are feeling better, you may resume normal activities 24 hours after receiving sedation. °· Keep all appointments as scheduled. Follow all instructions. °· Ask questions if you do not understand. °SEEK MEDICAL CARE IF:  °· Your skin is pale or bluish in color. °· You continue to feel sick to your stomach (nauseous) or throw up (vomit). °· Your pain is getting worse and not helped by medication. °· You have bleeding or swelling. °· You are still sleepy or feeling clumsy after 24 hours. °SEEK IMMEDIATE MEDICAL CARE IF:   °· You develop a rash. °· You have difficulty breathing. °· You develop any type of allergic problem. °· You have a fever. °Document Released: 03/22/2001 Document Revised: 09/19/2011 Document Reviewed: 03/04/2013 °Exit[employer]® Patient Information ©2014 [employer]. ° °

## 2013-10-01 NOTE — Anesthesia Postprocedure Evaluation (Signed)
  Anesthesia Post-op Note  Patient: Monica Jensen  Procedure(s) Performed: Procedure(s) (LRB): COLONOSCOPY WITH PROPOFOL (N/A)  Patient Location: PACU  Anesthesia Type: MAC  Level of Consciousness: awake and alert   Airway and Oxygen Therapy: Patient Spontanous Breathing  Post-op Pain: mild  Post-op Assessment: Post-op Vital signs reviewed, Patient's Cardiovascular Status Stable, Respiratory Function Stable, Patent Airway and No signs of Nausea or vomiting  Last Vitals:  Filed Vitals:   10/01/13 0910  BP: 116/77  Temp:   Resp: 14    Post-op Vital Signs: stable   Complications: No apparent anesthesia complications

## 2013-10-01 NOTE — Preoperative (Signed)
Beta Blockers   Reason not to administer Beta Blockers:Not Applicable 

## 2013-10-01 NOTE — Anesthesia Preprocedure Evaluation (Addendum)
Anesthesia Evaluation  Patient identified by MRN, date of birth, ID band Patient awake    Reviewed: Allergy & Precautions, H&P , NPO status , Patient's Chart, lab work & pertinent test results  History of Anesthesia Complications Negative for: history of anesthetic complications  Airway Mallampati: II TM Distance: >3 FB Neck ROM: Full    Dental  (+) Dental Advisory Given   Pulmonary Current Smoker,  breath sounds clear to auscultation        Cardiovascular negative cardio ROS  Rhythm:Regular Rate:Normal     Neuro/Psych  Headaches, PSYCHIATRIC DISORDERS Depression negative neurological ROS  negative psych ROS   GI/Hepatic Neg liver ROS, GERD-  Medicated,  Endo/Other  Hypothyroidism   Renal/GU negative Renal ROS  negative genitourinary   Musculoskeletal negative musculoskeletal ROS (+)   Abdominal   Peds negative pediatric ROS (+)  Hematology negative hematology ROS (+) anemia ,   Anesthesia Other Findings   Reproductive/Obstetrics negative OB ROS                          Anesthesia Physical Anesthesia Plan  ASA: II  Anesthesia Plan: MAC   Post-op Pain Management:    Induction: Intravenous  Airway Management Planned:   Additional Equipment:   Intra-op Plan:   Post-operative Plan:   Informed Consent: I have reviewed the patients History and Physical, chart, labs and discussed the procedure including the risks, benefits and alternatives for the proposed anesthesia with the patient or authorized representative who has indicated his/her understanding and acceptance.   Dental advisory given  Plan Discussed with: CRNA  Anesthesia Plan Comments:         Anesthesia Quick Evaluation

## 2013-10-01 NOTE — Transfer of Care (Signed)
Immediate Anesthesia Transfer of Care Note  Patient: Monica Jensen  Procedure(s) Performed: Procedure(s): COLONOSCOPY WITH PROPOFOL (N/A)  Patient Location: PACU and Endoscopy Unit  Anesthesia Type:MAC  Level of Consciousness: awake, oriented, patient cooperative, lethargic and responds to stimulation  Airway & Oxygen Therapy: Patient Spontanous Breathing and Patient connected to face mask oxygen  Post-op Assessment: Report given to PACU RN, Post -op Vital signs reviewed and stable and Patient moving all extremities  Post vital signs: Reviewed and stable  Complications: No apparent anesthesia complications

## 2013-10-02 ENCOUNTER — Encounter (HOSPITAL_COMMUNITY): Payer: Self-pay | Admitting: Gastroenterology

## 2013-10-08 ENCOUNTER — Ambulatory Visit (HOSPITAL_COMMUNITY): Payer: Managed Care, Other (non HMO) | Admitting: Psychiatry

## 2013-10-08 VITALS — BP 111/83 | HR 69 | Ht 63.0 in | Wt 108.4 lb

## 2013-10-08 DIAGNOSIS — F1010 Alcohol abuse, uncomplicated: Secondary | ICD-10-CM

## 2013-10-08 DIAGNOSIS — F332 Major depressive disorder, recurrent severe without psychotic features: Secondary | ICD-10-CM

## 2013-10-08 MED ORDER — CITALOPRAM HYDROBROMIDE 20 MG PO TABS: 40.0000 mg | ORAL_TABLET | Freq: Every day | ORAL | Status: DC

## 2013-10-08 MED ORDER — QUETIAPINE FUMARATE 25 MG PO TABS: 25.0000 mg | ORAL_TABLET | Freq: Two times a day (BID) | ORAL | Status: DC

## 2013-10-08 MED ORDER — LITHIUM CARBONATE 300 MG PO TABS: 300.0000 mg | ORAL_TABLET | Freq: Every morning | ORAL | Status: DC
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
## 2013-10-09 DIAGNOSIS — F332 Major depressive disorder, recurrent severe without psychotic features: Secondary | ICD-10-CM | POA: Insufficient documentation

## 2013-10-09 DIAGNOSIS — F1010 Alcohol abuse, uncomplicated: Secondary | ICD-10-CM | POA: Insufficient documentation

## 2013-10-25 ENCOUNTER — Ambulatory Visit (INDEPENDENT_AMBULATORY_CARE_PROVIDER_SITE_OTHER): Payer: Managed Care, Other (non HMO) | Admitting: General Surgery

## 2013-10-25 ENCOUNTER — Ambulatory Visit (INDEPENDENT_AMBULATORY_CARE_PROVIDER_SITE_OTHER): Payer: Managed Care, Other (non HMO) | Admitting: Psychiatry

## 2013-10-25 VITALS — BP 125/83 | HR 75 | Ht 63.5 in | Wt 118.2 lb

## 2013-10-25 DIAGNOSIS — F319 Bipolar disorder, unspecified: Secondary | ICD-10-CM

## 2013-10-25 DIAGNOSIS — F3189 Other bipolar disorder: Secondary | ICD-10-CM

## 2013-10-25 NOTE — Progress Notes (Signed)
[facility] [id number] Progress Note  [name] [id number] 48 y.o.  10/25/2013 10:02 AM  Chief Complaint: "depression, tired"  History of Present Illness:  Patient is a 48 yo WF with long history of MDD/ Bipolar type II with depressed mood.Marland Kitchen.  She was seen for an initial assessment last visit and Seroquel was started to help with her severe depression.  Patient reports that the morning dose of Seroquel made her very sleepy and tired, she stopped it. She is taking the night dose. States her mood is much better now, sleeping well. Eating well. Having difficulty at work due to being tired. States in the past she was able to work long stretches of time. Currently feels like she struggling to get her work done and spending a lot of time at work.  Overall, reports doing better.  Suicidal Ideation: No Plan Formed: No Patient has means to carry out plan: No  Homicidal Ideation: No Plan Formed: No Patient has means to carry out plan: No  Review of Systems: Psychiatric: Agitation: No Hallucination: No Depressed Mood: No Insomnia: No Hypersomnia: No Altered Concentration: No Feels Worthless: No Grandiose Ideas: No Belief In Special Powers: No New/Increased Substance Abuse: No Compulsions: No  Neurologic: Headache: No Seizure: No Paresthesias: No  Past Medical Family, Social History: Lives with her fiance.   Outpatient Encounter Prescriptions as of 10/25/2013  Medication Sig  . citalopram (CELEXA) 20 MG tablet Take 2 tablets (40 mg total) by mouth daily.  Marland Kitchen. GAVILYTE-N WITH FLAVOR PACK 420 G solution   . hydrOXYzine (VISTARIL) 25 MG capsule   . ibuprofen (ADVIL,MOTRIN) 200 MG tablet Take 600 mg by mouth every 6 (six) hours as needed.  Marland Kitchen. levothyroxine (SYNTHROID, LEVOTHROID) 88 MCG tablet Take 88 mcg by mouth daily before breakfast.  . lithium 300 MG tablet Take 1 tablet (300 mg total) by mouth every morning.  . pantoprazole (PROTONIX) 40 MG tablet Take 40 mg by mouth  daily.  . QUEtiapine (SEROQUEL) 25 MG tablet Take 1 tablet (25 mg total) by mouth 2 (two) times daily.  Marland Kitchen. zolpidem (AMBIEN) 10 MG tablet Take 10 mg by mouth at bedtime as needed. For sleep    Past Psychiatric History/Hospitalization(s): Anxiety: No Bipolar Disorder: Yes Depression: No Mania: No Psychosis: No Schizophrenia: No Personality Disorder: No Hospitalization for psychiatric illness: Yes History of Electroconvulsive Shock Therapy: No Prior Suicide Attempts: No  Physical Exam: Constitutional:  BP 125/83  Pulse 75  Ht 5' 3.5" (1.613 m)  Wt 118 lb 3.2 oz (53.615 kg)  BMI 20.61 kg/m2  General Appearance: alert, oriented, no acute distress  Musculoskeletal: Strength & Muscle Tone: within normal limits Gait & Station: normal Patient leans: N/A  Psychiatric: Speech (describe rate, volume, coherence, spontaneity, and abnormalities if any): normal  Thought Process (describe rate, content, abstract reasoning, and computation): wnl  Associations: Coherent  Thoughts: normal  Mental Status: Orientation: oriented to person, place, time/date and situation Mood & Affect: normal affect Attention Span & Concentration: fair  Medical Decision Making (Choose Three): Established Problem, Stable/Improving (1), Review of Medication Regimen & Side Effects (2) and Review of New Medication or Change in Dosage (2)  Assessment: Axis I: Bipolar disorder, type II depressed mood  Axis II: none  Axis III: none  Axis IV: work issues  Axis V: 75   Plan: Decrease Celexa to 20mg  daily. If mood symptoms worsen, then increase Seroquel to 50mg  . Patient aware of side effects and benefits. Continue Lithium at 300mg  for now.  RTC in 1 month.  Patrick NorthAVI, Oretta Berkland, MD 10/25/2013

## 2013-10-31 ENCOUNTER — Ambulatory Visit (INDEPENDENT_AMBULATORY_CARE_PROVIDER_SITE_OTHER): Payer: Managed Care, Other (non HMO) | Admitting: General Surgery

## 2013-11-20 ENCOUNTER — Ambulatory Visit (INDEPENDENT_AMBULATORY_CARE_PROVIDER_SITE_OTHER): Payer: Managed Care, Other (non HMO) | Admitting: General Surgery

## 2013-11-22 ENCOUNTER — Ambulatory Visit (INDEPENDENT_AMBULATORY_CARE_PROVIDER_SITE_OTHER): Payer: Managed Care, Other (non HMO) | Admitting: Psychiatry

## 2013-11-22 VITALS — BP 106/86 | HR 82 | Ht 63.0 in | Wt 117.0 lb

## 2013-11-22 DIAGNOSIS — F3341 Major depressive disorder, recurrent, in partial remission: Secondary | ICD-10-CM

## 2013-11-22 DIAGNOSIS — F3181 Bipolar II disorder: Secondary | ICD-10-CM

## 2013-11-22 DIAGNOSIS — F3189 Other bipolar disorder: Secondary | ICD-10-CM

## 2013-11-22 MED ORDER — CITALOPRAM HYDROBROMIDE 20 MG PO TABS: 20.0000 mg | ORAL_TABLET | Freq: Every day | ORAL | Status: DC

## 2013-11-22 MED ORDER — QUETIAPINE FUMARATE 25 MG PO TABS: 25.0000 mg | ORAL_TABLET | Freq: Two times a day (BID) | ORAL | Status: DC

## 2013-11-22 NOTE — Progress Notes (Signed)
Patient ID: Monica Jensen, female   DOB: [birth year], 48 y.o.   MRN: [id number]  [facility] [id number] Progress Note  Monica Jensen [id number] 48 y.o.  11/22/2013 9:34 AM  Chief Complaint: "depression, tired"  History of Present Illness:  Patient is a 48 yo WF with long history of MDD/ Bipolar type II with depressed mood. Patient has been taking  Seroquel at 25mg   And states it has helped with her severe depression.  Patient reports that the morning dose of Seroquel made her very sleepy and tired, she stopped it. She is taking the night dose. States her mood is much better now, sleeping well. Eating well. Mood at an 7-8 on a scale of 1-10 with 10 being her best mood. She is interested in weaning off the Lithium, feels it has caused some cognitive impairment. She is having 2-3 drinks per week in general, states last week was an exception , had several drinks due to parties. States having more motivation at work, still tired as compared to before. Currently feels like she struggling to get her work done and spending a lot of time at work.  Overall, reports doing better.  Suicidal Ideation: No Plan Formed: No Patient has means to carry out plan: No  Homicidal Ideation: No Plan Formed: No Patient has means to carry out plan: No  Review of Systems: Psychiatric: Agitation: No Hallucination: No Depressed Mood: No Insomnia: No Hypersomnia: No Altered Concentration: No Feels Worthless: No Grandiose Ideas: No Belief In Special Powers: No New/Increased Substance Abuse: No Compulsions: No  Neurologic: Headache: No Seizure: No Paresthesias: No  Past Medical Family, Social History: Lives with her fiance.   Outpatient Encounter Prescriptions as of 11/22/2013  Medication Sig  . citalopram (CELEXA) 20 MG tablet Take 2 tablets (40 mg total) by mouth daily.  Marland Kitchen. GAVILYTE-N WITH FLAVOR PACK 420 G solution   . hydrOXYzine (VISTARIL) 25 MG capsule   . ibuprofen (ADVIL,MOTRIN) 200 MG  tablet Take 600 mg by mouth every 6 (six) hours as needed.  Marland Kitchen. levothyroxine (SYNTHROID, LEVOTHROID) 88 MCG tablet Take 88 mcg by mouth daily before breakfast.  . lithium 300 MG tablet Take 1 tablet (300 mg total) by mouth every morning.  . pantoprazole (PROTONIX) 40 MG tablet Take 40 mg by mouth daily.  . QUEtiapine (SEROQUEL) 25 MG tablet Take 1 tablet (25 mg total) by mouth 2 (two) times daily.  Marland Kitchen. zolpidem (AMBIEN) 10 MG tablet Take 10 mg by mouth at bedtime as needed. For sleep    Past Psychiatric History/Hospitalization(s): Anxiety: No Bipolar Disorder: Yes Depression: No Mania: No Psychosis: No Schizophrenia: No Personality Disorder: No Hospitalization for psychiatric illness: Yes History of Electroconvulsive Shock Therapy: No Prior Suicide Attempts: No  Physical Exam: Constitutional:  There were no vitals taken for this visit.  General Appearance: alert, oriented, no acute distress  Musculoskeletal: Strength & Muscle Tone: within normal limits Gait & Station: normal Patient leans: N/A  Psychiatric: Speech (describe rate, volume, coherence, spontaneity, and abnormalities if any): normal  Thought Process (describe rate, content, abstract reasoning, and computation): wnl  Associations: Coherent  Thoughts: normal  Mental Status: Orientation: oriented to person, place, time/date and situation Mood & Affect: normal affect Attention Span & Concentration: fair  Medical Decision Making (Choose Three): Established Problem, Stable/Improving (1), Review of Medication Regimen & Side Effects (2) and Review of New Medication or Change in Dosage (2)  Assessment: Axis I: Bipolar disorder, type II depressed mood  Axis II: none  Axis III: none  Axis IV: work issues  Axis V: 75   Plan: Continue Celexa at 20mg  daily. If mood symptoms worsen, then increase Seroquel to 50mg  . Patient aware of side effects and benefits. Discontinue Lithium at 300mg . Patient made aware of  discontinuation symptoms. RTC in 1 month. Patient aware she will be seeing Dr.Agarwal next visit.  Patrick NorthAVI, Lucca Ballo, MD 11/22/2013

## 2013-12-13 ENCOUNTER — Ambulatory Visit (INDEPENDENT_AMBULATORY_CARE_PROVIDER_SITE_OTHER): Payer: Managed Care, Other (non HMO) | Admitting: General Surgery

## 2013-12-13 ENCOUNTER — Encounter (INDEPENDENT_AMBULATORY_CARE_PROVIDER_SITE_OTHER): Payer: Self-pay | Admitting: General Surgery

## 2013-12-13 VITALS — BP 118/80 | HR 71 | Temp 97.3°F | Ht 63.0 in | Wt 111.0 lb

## 2013-12-13 DIAGNOSIS — K648 Other hemorrhoids: Secondary | ICD-10-CM

## 2013-12-13 DIAGNOSIS — K641 Second degree hemorrhoids: Secondary | ICD-10-CM

## 2013-12-13 MED ORDER — HYDROCORTISONE 2.5 % RE CREA: 1.0000 "application " | TOPICAL_CREAM | Freq: Two times a day (BID) | RECTAL | Status: DC

## 2013-12-13 NOTE — Progress Notes (Signed)
Chief Complaint  Patient presents with  . Hemorrhoids    HISTORY: Monica Jensen is a 48 y.o. female who presents to the office with rectal bleeding.  Other symptoms include anal pain after BM's.  This had been occurring for several years and getting worse.  she has tried steroid creams and suppositories in the past with some success.  BM's makes the symptoms worse.   It is intermittent in nature (1-2 times a wk).  her bowel habits are regular and her bowel movements are usually soft.  She denies any straining.  her fiber intake is dietary.  her last colonoscopy was recently and was negative.     Past Medical History  Diagnosis Date  . Thyroid disease   . Depression   . GERD (gastroesophageal reflux disease)   . Hypothyroidism   . Headache(784.0)     migraines  . Anemia   . Complication of anesthesia     Epinephrine"tachycardia"-dentist  . History of Jensen stones     passed  . Concussion with brief LOC   . Memory loss   . 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 Fears memory loss. Questions  About  Recent memory, loss related [place].       Past Surgical History  Procedure Laterality Date  . Abdominal surgery      x2(laparoscopic, 1 open)  . Cesarean section    . Colon surgery      "blockage"  . Colonoscopy with propofol N/A 10/01/2013    Procedure: COLONOSCOPY WITH PROPOFOL;  Surgeon: Charolett BumpersMartin K Johnson, MD;  Location: [facility];  Service: Endoscopy;  Laterality: N/A;        Current Outpatient Prescriptions  Medication Sig Dispense Refill  . citalopram (CELEXA) 20 MG tablet Take 1 tablet (20 mg total) by mouth daily.  30 tablet  1  . GAVILYTE-N WITH FLAVOR PACK 420 G solution       . hydrOXYzine (VISTARIL) 25 MG capsule       . ibuprofen (ADVIL,MOTRIN) 200 MG tablet Take 600 mg by mouth every 6 (six) hours as needed.      Marland Kitchen. levothyroxine (SYNTHROID, LEVOTHROID) 88 MCG tablet  Take 88 mcg by mouth daily before breakfast.      . pantoprazole (PROTONIX) 40 MG tablet Take 40 mg by mouth daily.      . QUEtiapine (SEROQUEL) 25 MG tablet Take 1 tablet (25 mg total) by mouth 2 (two) times daily.  60 tablet  1  . hydrocortisone (ANUSOL-HC) 2.5 % rectal cream Place 1 application rectally 2 (two) times daily.  30 g  0   No current facility-administered medications for this visit.      Allergies  Allergen Reactions  . Penicillins Anaphylaxis  . Cheese Other (See Comments)    Headache  . Chocolate Other (See Comments)    Headache   . Other Other (See Comments)    MSG-Headache  . Epinephrine Palpitations    Heart racing      Family History  Problem Relation Age of Onset  . Heart disease Maternal Grandfather   . Heart disease Maternal Grandmother   . Lung cancer Maternal Grandmother     History   Social History  . Marital Status: Single    Spouse Name: N/A    Number of Children: 1  . Years of Education: College   Occupational History  .  Social History Main Topics  . Smoking status: Current Some Day Smoker    Types: Cigarettes  . Smokeless tobacco: Never Used     Comment: 1 pk per week-social  . Alcohol Use: Yes     Comment: social  . Drug Use: No  . Sexual Activity: Yes   Other Topics Concern  . None   Social History Narrative   Patient lives with her fianceeLaure Jensen) and her son.   Patient has one child.   Patient has college education.   Patient works at [employer] (full-time).   Patient is right-handed.   Patient drinks four cups of caffeine per day.      REVIEW OF SYSTEMS - PERTINENT POSITIVES ONLY: Review of Systems - General ROS: negative for - chills, fever or weight loss Hematological and Lymphatic ROS: negative for - bleeding problems, blood clots or bruising Respiratory ROS: no cough, shortness of breath, or wheezing Cardiovascular ROS: no chest pain or dyspnea on exertion Gastrointestinal ROS: positive for -  blood in stools negative for - change in bowel habits or constipation Genito-Urinary ROS: no dysuria, trouble voiding, or hematuria  EXAM: Filed Vitals:   12/13/13 1354  BP: 118/80  Pulse: 71  Temp: 97.3 F (36.3 C)    General appearance: alert and cooperative Resp: clear to auscultation bilaterally Cardio: regular rate and rhythm GI: soft, non-tender; bowel sounds normal; no masses,  no organomegaly  Procedure: Anoscopy Surgeon: Monica Jensen Diagnosis: rectal bleeding  Assistant: Monica Jensen After the risks and benefits were explained, verbal consent was obtained for above procedure  Anesthesia: none Findings: R A grade 2, inflamed internal hemorrhoids    ASSESSMENT AND PLAN: Monica Jensen is a 48 y.o. female with a grade 3 prolapsed internal hemorrhoid with rectal bleeding and pain.  I gave her the option of banding in the office or surgical resection.  We discussed the risks and benefits of both.  She will call the office when she has made a decision.  I recommended that she continue the hemorrhoid suppositories and use a fiber supplement daily.      Monica Panda, MD Colon and Rectal Surgery / General Surgery [facility]      Visit Diagnoses: 1. Prolapsed internal hemorrhoids, grade 2     Primary Care Physician: Monica Dubonnet, MD

## 2013-12-13 NOTE — Patient Instructions (Signed)
HEMORRHOIDS    Did you know... Hemorrhoids are one of the most common ailments known.  More than half the population will develop hemorrhoids, usually after age 47.  Millions of Americans currently suffer from hemorrhoids.  The average person suffers in silence for a long period before seeking medical care.  Today's treatment methods make some types of hemorrhoid removal much less painful.  What are hemorrhoids? Often described as "varicose veins of the anus and rectum", hemorrhoids are enlarged, bulging blood vessels in and about the anus and lower rectum. There are two types of hemorrhoids: external and internal, which refer to their location.  External (outside) hemorrhoids develop near the anus and are covered by very sensitive skin. These are usually painless. However, if a blood clot (thrombosis) develops in an external hemorrhoid, it becomes a painful, hard lump. The external hemorrhoid may bleed if it ruptures. Internal (inside) hemorrhoids develop within the anus beneath the lining. Painless bleeding and protrusion during bowel movements are the most common symptom. However, an internal hemorrhoid can cause severe pain if it is completely "prolapsed" - protrudes from the anal opening and cannot be pushed back inside.   What causes hemorrhoids? An exact cause is unknown; however, the upright posture of humans alone forces a great deal of pressure on the rectal veins, which sometimes causes them to bulge. Other contributing factors include:  . Aging  . Chronic constipation or diarrhea  . Pregnancy  . Heredity  . Straining during bowel movements  . Faulty bowel function due to overuse of laxatives or enemas . Spending long periods of time (e.g., reading) on the toilet  Whatever the cause, the tissues supporting the vessels stretch. As a result, the vessels dilate; their walls become thin and bleed. If the stretching and pressure continue, the weakened vessels protrude.  What are the  symptoms? If you notice any of the following, you could have hemorrhoids:  . Bleeding during bowel movements  . Protrusion during bowel movements . Itching in the anal area  . Pain  . Sensitive lump(s)  How are hemorrhoids treated? Mild symptoms can be relieved frequently by increasing the amount of fiber (e.g., fruits, vegetables, breads and cereals) and fluids in the diet. Eliminating excessive straining reduces the pressure on hemorrhoids and helps prevent them from protruding. A sitz bath - sitting in plain warm water for about 10 minutes - can also provide some relief . With these measures, the pain and swelling of most symptomatic hemorrhoids will decrease in two to seven days, and the firm lump should recede within four to six weeks. In cases of severe or persistent pain from a thrombosed hemorrhoid, your physician may elect to remove the hemorrhoid containing the clot with a small incision. Performed under local anesthesia as an outpatient, this procedure generally provides relief. Severe hemorrhoids may require special treatment, much of which can be performed on an outpatient basis.  . Ligation - the rubber band treatment - works effectively on internal hemorrhoids that protrude with bowel movements. A small rubber band is placed over the hemorrhoid, cutting off its blood supply. The hemorrhoid and the band fall off in a few days and the wound usually heals in a week or two. This procedure sometimes produces mild discomfort and bleeding and may need to be repeated for a full effect.  There is a more intense version of this procedure that is done in the OR as outpatient surgery called THD.  It involves identifying blood vessels leading to the   hemorrhoids and then tying them off with sutures.  This method is a little more painful than rubber band ligation but less painful than traditional hemorrhoidectomy and usually does not have to be repeated.  It is best for internal hemorrhoids that  bleed.  Rubber Band Ligation of Internal Hemorrhoids:  A.  Bulging, bleeding, internal hemorrhoid B.  Rubber band applied at the base of the hemorrhoid C.  About 7 days later, the banded hemorrhoid has fallen off leaving a small scar (arrow)  . Injection and Coagulation can also be used on bleeding hemorrhoids that do not protrude. Both methods are relatively painless and cause the hemorrhoid to shrivel up. . Hemorrhoidectomy - surgery to remove the hemorrhoids - is the most complete method for removal of internal and external hemorrhoids. It is necessary when (1) clots repeatedly form in external hemorrhoids; (2) ligation fails to treat internal hemorrhoids; (3) the protruding hemorrhoid cannot be reduced; or (4) there is persistent bleeding. A hemorrhoidectomy removes excessive tissue that causes the bleeding and protrusion. It is done under anesthesia using sutures, and may, depending upon circumstances, require hospitalization and a period of inactivity. Laser hemorrhoidectomies do not offer any advantage over standard operative techniques. They are also quite expensive, and contrary to popular belief, are no less painful.  Do hemorrhoids lead to cancer? No. There is no relationship between hemorrhoids and cancer. However, the symptoms of hemorrhoids, particularly bleeding, are similar to those of colorectal cancer and other diseases of the digestive system. Therefore, it is important that all symptoms are investigated by a physician specially trained in treating diseases of the colon and rectum and that everyone 50 years or older undergo screening tests for colorectal cancer. Do not rely on over-the-counter medications or other self-treatments. See a colorectal surgeon first so your symptoms can be properly evaluated and effective treatment prescribed.  2012 American Society of Colon & Rectal Surgeons    Fiber Chart  You should 25-30g of fiber per day and drinking 8 glasses of water to help  your bowels move regularly.  In the chart below you can look up how much fiber you are getting in an average day.  If you are not getting enough fiber, you should add a fiber supplement to your diet.  Examples of this include Metamucil, FiberCon and Citrucel.  These can be purchased at your local grocery store or pharmacy.      http://www.[employer].edu/offices/health/nutritioncoach/AtoZ/handouts/Fiber.pdf    GETTING TO GOOD BOWEL HEALTH. Irregular bowel habits such as constipation can lead to many problems over time.  Having one soft bowel movement a day is the most important way to prevent further problems.  The anorectal canal is designed to handle stretching and feces to safely manage our ability to get rid of solid waste (feces, poop, stool) out of our body.  BUT, hard constipated stools can act like ripping concrete bricks causing inflamed hemorrhoids, anal fissures, abdominal pain and bloating.     The goal: ONE SOFT BOWEL MOVEMENT A DAY!  To have soft, regular bowel movements:    Drink at least 8 tall glasses of water a day.     Take plenty of fiber.  Fiber is the undigested part of plant food that passes into the colon, acting s "natures broom" to encourage bowel motility and movement.  Fiber can absorb and hold large amounts of water. This results in a larger, bulkier stool, which is soft and easier to pass. Work gradually over several weeks up to 6 servings a   day of fiber (25g a day even more if needed) in the form of: o Vegetables -- Root (potatoes, carrots, turnips), leafy green (lettuce, salad greens, celery, spinach), or cooked high residue (cabbage, broccoli, etc) o Fruit -- Fresh (unpeeled skin & pulp), Dried (prunes, apricots, cherries, etc ),  or stewed ( applesauce)  o Whole grain breads, pasta, etc (whole wheat)  o Bran cereals    Bulking Agents -- This type of water-retaining fiber generally is easily obtained each day by one of the following:  o Psyllium bran -- The psyllium  plant is remarkable because its ground seeds can retain so much water. This product is available as Metamucil, Konsyl, Effersyllium, Per Diem Fiber, or the less expensive generic preparation in drug and health food stores. Although labeled a laxative, it really is not a laxative.  o Methylcellulose -- This is another fiber derived from wood which also retains water. It is available as Citrucel. o Polyethylene Glycol - and "artificial" fiber commonly called Miralax or Glycolax.  It is helpful for people with gassy or bloated feelings with regular fiber o Flax Seed - a less gassy fiber than psyllium   No reading or other relaxing activity while on the toilet. If bowel movements take longer than 5 minutes, you are too constipated.   AVOID CONSTIPATION.  High fiber and water intake usually takes care of this.  Sometimes a laxative is needed to stimulate more frequent bowel movements, but    Laxatives are not a good long-term solution as it can wear the colon out. o Osmotics (Milk of Magnesia, Fleets phosphosoda, Magnesium citrate, MiraLax, GoLytely) are safer than  o Stimulants (Senokot, Castor Oil, Dulcolax, Ex Lax)    o Do not take laxatives for more than 7days in a row.    IF SEVERELY CONSTIPATED, try a Bowel Retraining Program: o Do not use laxatives.  o Eat a diet high in roughage, such as bran cereals and leafy vegetables.  o Drink six (6) ounces of prune or apricot juice each morning.  o Eat two (2) large servings of stewed fruit each day.  o Take one (1) heaping tablespoon of a psyllium-based bulking agent twice a day. Use sugar-free sweetener when possible to avoid excessive calories.  o Eat a normal breakfast.  o Set aside 15 minutes after breakfast to sit on the toilet, but do not strain to have a bowel movement.  o If you do not have a bowel movement by the third day, use an enema and repeat the above steps.    

## 2013-12-19 HISTORY — PX: MOUTH SURGERY: SHX715

## 2013-12-26 ENCOUNTER — Ambulatory Visit (INDEPENDENT_AMBULATORY_CARE_PROVIDER_SITE_OTHER): Payer: Managed Care, Other (non HMO) | Admitting: Psychiatry

## 2013-12-26 ENCOUNTER — Encounter (HOSPITAL_COMMUNITY): Payer: Self-pay | Admitting: Psychiatry

## 2013-12-26 VITALS — BP 107/78 | HR 61 | Ht 63.5 in | Wt 112.0 lb

## 2013-12-26 DIAGNOSIS — F331 Major depressive disorder, recurrent, moderate: Secondary | ICD-10-CM

## 2013-12-26 DIAGNOSIS — F329 Major depressive disorder, single episode, unspecified: Secondary | ICD-10-CM

## 2013-12-26 MED ORDER — QUETIAPINE FUMARATE 25 MG PO TABS
25.0000 mg | ORAL_TABLET | Freq: Every day | ORAL | Status: DC
Start: 2013-12-26 — End: 2014-02-04

## 2013-12-26 MED ORDER — CITALOPRAM HYDROBROMIDE 20 MG PO TABS: 40.0000 mg | ORAL_TABLET | Freq: Every day | ORAL | Status: DC

## 2013-12-26 NOTE — Progress Notes (Signed)
[id number] Progress Note  Monica BurkeBrenda W Jensen [id number] 48 y.o.  12/26/2013 3:48 PM  Chief Complaint: medication management  History of Present Illness:  Today pt disagrees with diagnosis of Bipolar II disorder. Denies symptoms of hypomania/mania including episodes of increased energy, decreased need for sleep, elevated mood and impulsivity. States she does have periods of time related to mood lability, irritability and distractability. Thinks it is related to her hx of 7 concussions or due to her meds (Lithium and Ambien). States she has been evaluated by multiple providers who have never diagnosed with Bipolar disorder. Recently evaluted by neurologist who recommended pt get off Lithium and Ambien. Pt has tolerated the discontinuation of Lithium well.   Recent Psychological testing show deficits in long term and short term memory and immediate recall.   Depression and anxiety are improved. Reports increase in irritability and impatient. Sleep is improved with Seroquel 25mg . Pt is sleeping about 6 hrs of sleep per night.  States she is unable to go up b/c it makes her very tired. Energy is ok after the morning time. Concentration and appetite are good. Denies worthlessness and hopelessness. Taking Celexa as prescribed and denies SE.    Suicidal Ideation: No Plan Formed: No Patient has means to carry out plan: No  Homicidal Ideation: No Plan Formed: No Patient has means to carry out plan: No  Review of Systems: Psychiatric: Agitation: Yes Hallucination: No Depressed Mood: No Insomnia: No Hypersomnia: No Altered Concentration: Yes Feels Worthless: No Grandiose Ideas: No Belief In Special Powers: No New/Increased Substance Abuse: No Compulsions: No  Neurologic: Headache: Yes Seizure: No Paresthesias: No  Past Medical Family, Social History: lives with fiance and her 13yo son. Drinks 2-3 glasses of wine 2-3 times a week. She smokes 3-4 cigarettes every few days.  Has been smoking since age 48. Denies illicit drug use. Reports strong family hx of depression and anxiety and alcohol abuse.    Outpatient Encounter Prescriptions as of 12/26/2013  Medication Sig  . citalopram (CELEXA) 20 MG tablet Take 1 tablet (20 mg total) by mouth daily.  . clindamycin (CLEOCIN) 75 MG capsule Take 75 mg by mouth 4 (four) times daily.  . hydrocortisone (ANUSOL-HC) 2.5 % rectal cream Place 1 application rectally 2 (two) times daily.  Marland Kitchen. ibuprofen (ADVIL,MOTRIN) 200 MG tablet Take 600 mg by mouth every 6 (six) hours as needed.  Marland Kitchen. levothyroxine (SYNTHROID, LEVOTHROID) 88 MCG tablet Take 88 mcg by mouth daily before breakfast.  . naproxen (NAPROSYN) 250 MG tablet Take 250 mg by mouth 3 (three) times daily with meals.  . pantoprazole (PROTONIX) 40 MG tablet Take 40 mg by mouth daily.  . QUEtiapine (SEROQUEL) 25 MG tablet Take 1 tablet (25 mg total) by mouth 2 (two) times daily.  Marland Kitchen. GAVILYTE-N WITH FLAVOR PACK 420 G solution   . hydrOXYzine (VISTARIL) 25 MG capsule     Past Psychiatric History/Hospitalization(s): Anxiety: yes Bipolar Disorder: No Depression: Yes Mania: No  Psychosis: No  Schizophrenia: No  Personality Disorder: No  Hospitalization for psychiatric illness: Yes  History of Electroconvulsive Shock Therapy: No  Prior Suicide Attempts: No   Physical Exam: Constitutional:  BP 107/78  Pulse 61  Ht 5' 3.5" (1.613 m)  Wt 112 lb (50.803 kg)  BMI 19.53 kg/m2  General Appearance: alert, oriented, no acute distress  Musculoskeletal: Strength & Muscle Tone: within normal limits Gait & Station: normal Patient leans: N/A  Mental Status Examination/Evaluation:  Objective: Appearance: fairly groomed, appears to be stated  age  Attitude: Calm and cooperative  Eye Contact: Fair   Speech and Language: Clear and Coherent, spontaneous, normal rate  Volume: Normal   Mood: euthymic  Affect: Full Range   Thought Process: Coherent   Orientation: Full (Time,  Place, and Person)   Thought Content: WDL  Suicidal Thoughts: No  Homicidal Thoughts: No   Judgement: Fair   General fund of knowledge: average  Insight: Present   Psychomotor Activity: Normal   Akathisia: No   Handed: Right   AIMS (if indicated): Facial and Oral Movements  Muscles of Facial Expression: None, normal  Lips and Perioral Area: None, normal  Jaw: None, normal  Tongue: None, normal Extremity Movements: Upper (arms, wrists, hands, fingers): None, normal  Lower (legs, knees, ankles, toes): None, normal,  Trunk Movements:  Neck, shoulders, hips: None, normal,  Overall Severity : Severity of abnormal movements (highest score from questions above): None, normal  Incapacitation due to abnormal movements: None, normal  Patient's awareness of abnormal movements (rate only patient's report): No Awareness, Dental Status  Current problems with teeth and/or dentures?: No  Does patient usually wear dentures?: No       Medical Decision Making (Choose Three): Established Problem, Stable/Improving (1), Review of Psycho-Social Stressors (1), Review of Medication Regimen & Side Effects (2) and Review of New Medication or Change in Dosage (2)  Assessment: Axis I: MDD, r/o Bipolar disorder, type II depressed mood- based off pt's reported symptoms no evidence of hypomania or mania. Will continue to monitor.  Axis II: none  Axis III: none  Axis IV: work issues  Axis V: 75   Plan: continue meds, risks/benefits and SE of the medication discussed. Pt verbalized understanding and verbal consent obtained for treatment.  Affirm with the patient that the medications are taken as ordered. Patient expressed understanding of how their medications were to be used.    Increase Celexa to 40mg  po qD for mood Continue Seroquel 25mg  po qHS for adjunct mood and insomnia  Therapy: brief supportive therapy provided. Discussed psychosocial stressors in detail.    Pt denies SI and is at an acute  low risk for suicide.Patient told to call clinic if any problems occur. Patient advised to go to ER  if she should develop SI/HI, side effects, or if symptoms worsen. Has crisis numbers to call if needed. Pt verbalized understanding.  F/up in 2 months or sooner if needed  Oletta DarterAGARWAL, SALINA, MD 12/26/2013

## 2014-02-04 ENCOUNTER — Encounter (HOSPITAL_COMMUNITY): Payer: Self-pay | Admitting: Psychiatry

## 2014-02-04 ENCOUNTER — Ambulatory Visit (INDEPENDENT_AMBULATORY_CARE_PROVIDER_SITE_OTHER): Payer: Managed Care, Other (non HMO) | Admitting: Psychiatry

## 2014-02-04 VITALS — BP 132/81 | HR 90 | Ht 63.5 in | Wt 108.8 lb

## 2014-02-04 DIAGNOSIS — F329 Major depressive disorder, single episode, unspecified: Secondary | ICD-10-CM

## 2014-02-04 DIAGNOSIS — F331 Major depressive disorder, recurrent, moderate: Secondary | ICD-10-CM | POA: Insufficient documentation

## 2014-02-04 MED ORDER — LAMOTRIGINE 25 MG PO TABS: ORAL_TABLET | ORAL | Status: DC

## 2014-02-04 MED ORDER — HYDROXYZINE PAMOATE 25 MG PO CAPS: ORAL_CAPSULE | ORAL | Status: DC

## 2014-02-04 NOTE — Progress Notes (Signed)
[facility] [id number] Progress Note  Monica Jensen [id number] 48 y.o.  02/04/2014 1:42 PM  Chief Complaint: symptoms are worse  History of Present Illness:  Pt states she is not functioning well at all. Pt states last 2 weeks she is sleeping thru out the day, missing a lot at work and having crying spells. Pt is unable to focus at work and has missed a lot of deadlines. She is trying to do some work from home. Monica SjogrenBoss has suggested she take some FMLA.   Pt is trying to break with fiance but realizes it is b/c she is depressed.   Pt also thinks she is going to peri-menopause.  Pt is irritable, angry, impatient and depressed. Endorsing anhedonia and states nothing is bring her joy. Energy and motivation are low.   Denies symptoms of hypomania/mania including episodes of increased energy, decreased need for sleep, elevated mood and impulsivity. States she does have periods of time related to mood lability, irritability and distractability that are worsening. Pt is concerned and thinks Seroquel isn't working anymore. She thinks she might need to go back on Lithium.   At last visit b/c states she thought her cognitive issues were related related to her hx of 7 concussions or due to her meds (Lithium and Ambien). Recently evaluted by neurologist who recommended pt get off Lithium and Ambien.  Recent Psychological testing show deficits in long term and short term memory and immediate recall.   Suicidal Ideation: Yes Plan Formed: No Patient has means to carry out plan: No  Homicidal Ideation: No Plan Formed: No Patient has means to carry out plan: No  Review of Systems: Psychiatric: Agitation: Yes Hallucination: No Depressed Mood: Yes Insomnia: No Hypersomnia: Yes Altered Concentration: Yes Feels Worthless: Yes Grandiose Ideas: No Belief In Special Powers: No New/Increased Substance Abuse: No Compulsions: No  Neurologic: Headache: Yes Seizure: No Paresthesias: No  Past  Medical Family, Social History: lives with fiance and her 13yo son. Drinks 2-3 glasses of wine 2-3 times a week. She smokes 3-4 cigarettes every few days. Has been smoking since age 48. Denies illicit drug use. Reports strong family hx of depression and anxiety and alcohol abuse.    Outpatient Encounter Prescriptions as of 02/04/2014  Medication Sig  . citalopram (CELEXA) 20 MG tablet Take 2 tablets (40 mg total) by mouth daily.  . hydrocortisone (ANUSOL-HC) 2.5 % rectal cream Place 1 application rectally 2 (two) times daily.  Marland Kitchen. ibuprofen (ADVIL,MOTRIN) 200 MG tablet Take 600 mg by mouth every 6 (six) hours as needed.  Marland Kitchen. levothyroxine (SYNTHROID, LEVOTHROID) 88 MCG tablet Take 88 mcg by mouth daily before breakfast.  . pantoprazole (PROTONIX) 40 MG tablet Take 40 mg by mouth daily.  . QUEtiapine (SEROQUEL) 25 MG tablet Take 1 tablet (25 mg total) by mouth at bedtime.  . clindamycin (CLEOCIN) 75 MG capsule Take 75 mg by mouth 4 (four) times daily.  Marland Kitchen. GAVILYTE-N WITH FLAVOR PACK 420 G solution   . hydrOXYzine (VISTARIL) 25 MG capsule   . naproxen (NAPROSYN) 250 MG tablet Take 250 mg by mouth 3 (three) times daily with meals.    Past Psychiatric History/Hospitalization(s): Anxiety: yes Bipolar Disorder: No Depression: Yes Mania: No  Psychosis: No  Schizophrenia: No  Personality Disorder: No  Hospitalization for psychiatric illness: Yes  History of Electroconvulsive Shock Therapy: No  Prior Suicide Attempts: No   Physical Exam: Constitutional:  BP 132/81  Pulse 90  Ht 5' 3.5" (1.613 m)  Wt 108 lb  12.8 oz (49.351 kg)  BMI 18.97 kg/m2  General Appearance: alert, oriented, no acute distress  Musculoskeletal: Strength & Muscle Tone: within normal limits Gait & Station: normal Patient leans: N/A  Mental Status Examination/Evaluation: Objective: Attitude: Calm and cooperative  Appearance: Casual,appears to be stated age  Eye Contact::  Good  Speech:  Clear and Coherent and  Normal Rate  Volume:  Normal  Mood:  depressed  Affect:  Congruent and Tearful  Thought Process:  Goal Directed, Linear and Logical  Orientation:  Full (Time, Place, and Person)  Thought Content:  WDL  Suicidal Thoughts:  Yes.  without intent/plan  Homicidal Thoughts:  No  Judgement:  Fair  Insight:  Fair  Concentration: good  Memory: Immediate- fair Recent- fair Remote- fair  Recall: fair  Language: fair  Gait and Station: normal  [employer] of Knowledge: average  Psychomotor Activity:  Normal  Akathisia:  No  Handed:  Right  AIMS (if indicated):  Facial and Oral Movements  Muscles of Facial Expression: None, normal  Lips and Perioral Area: None, normal  Jaw: None, normal  Tongue: None, normal Extremity Movements: Upper (arms, wrists, hands, fingers): None, normal  Lower (legs, knees, ankles, toes): None, normal,  Trunk Movements:  Neck, shoulders, hips: None, normal,  Overall Severity : Severity of abnormal movements (highest score from questions above): None, normal  Incapacitation due to abnormal movements: None, normal  Patient's awareness of abnormal movements (rate only patient's report): No Awareness, Dental Status  Current problems with teeth and/or dentures?: No  Does patient usually wear dentures?: No        Medical Decision Making (Choose Three): Review of Psycho-Social Stressors (1), Established Problem, Worsening (2), Review of Medication Regimen & Side Effects (2) and Review of New Medication or Change in Dosage (2)  Assessment: Axis I: MDD, r/o Bipolar disorder, type II depressed mood.  Axis II: none  Axis III: GERD, hypothyroidism, anemia, memory loss, headaches Axis IV: work issues  Axis V: 60   Plan: Continue  Celexa to 40mg  po qD for mood Start trial with Lamictal 25mg  x 2 weeks, taper up 25mg  every 2 weeks to target 100mg  qd for mood issues discontinue Seroquel 25mg   Start trial of Vistaril 25-50mg  po qHS prn insomnia.  -Risks and  benefits, side effects and alternatives discussed with patient, pt was given an opportunity to ask questions about medication, illness, and treatment. All current psychiatric medications have been reviewed and discussed with the patient and adjusted as clinically appropriate. The patient has been provided an accurate and updated list of the medications being now prescribed.  Labs- pt will sign MR to get recent labs sent to clinic  Therapy: brief supportive therapy provided. Discussed psychosocial stressors in detail.  Pt plans to start therapy again soon.   Pt denies SI and is at an acute low risk for suicide.Patient told to call clinic if any problems occur. Patient advised to go to ER  if she should develop SI/HI, side effects, or if symptoms worsen. Has crisis numbers to call if needed. Pt verbalized understanding.  F/up in 6 weeks or sooner if needed  Oletta Darter, MD 02/04/2014

## 2014-03-04 ENCOUNTER — Ambulatory Visit (HOSPITAL_COMMUNITY): Payer: Self-pay | Admitting: Psychiatry

## 2014-03-14 ENCOUNTER — Telehealth (INDEPENDENT_AMBULATORY_CARE_PROVIDER_SITE_OTHER): Payer: Self-pay

## 2014-03-14 DIAGNOSIS — K641 Second degree hemorrhoids: Secondary | ICD-10-CM

## 2014-03-14 MED ORDER — HYDROCORTISONE 2.5 % RE CREA: 1.0000 "application " | TOPICAL_CREAM | Freq: Two times a day (BID) | RECTAL | Status: DC

## 2014-03-14 NOTE — Telephone Encounter (Signed)
Refill on Hydrocortisone 2.5% x 2rf's e prescribed to [facility] on [address] per Dr Michaell Cowing. Pt advised.

## 2014-03-18 ENCOUNTER — Encounter (HOSPITAL_COMMUNITY): Payer: Self-pay | Admitting: Psychiatry

## 2014-03-18 ENCOUNTER — Ambulatory Visit (INDEPENDENT_AMBULATORY_CARE_PROVIDER_SITE_OTHER): Payer: Managed Care, Other (non HMO) | Admitting: Psychiatry

## 2014-03-18 VITALS — BP 114/82 | HR 87 | Ht 63.5 in | Wt 110.8 lb

## 2014-03-18 DIAGNOSIS — F331 Major depressive disorder, recurrent, moderate: Secondary | ICD-10-CM

## 2014-03-18 DIAGNOSIS — F329 Major depressive disorder, single episode, unspecified: Secondary | ICD-10-CM

## 2014-03-18 MED ORDER — LAMOTRIGINE 100 MG PO TABS: ORAL_TABLET | ORAL | Status: DC

## 2014-03-18 MED ORDER — CITALOPRAM HYDROBROMIDE 20 MG PO TABS: 40.0000 mg | ORAL_TABLET | Freq: Every day | ORAL | Status: DC

## 2014-03-18 MED ORDER — CITALOPRAM HYDROBROMIDE 40 MG PO TABS: 40.0000 mg | ORAL_TABLET | Freq: Every day | ORAL | Status: DC

## 2014-03-18 MED ORDER — LAMOTRIGINE 100 MG PO TABS: 100.0000 mg | ORAL_TABLET | Freq: Every day | ORAL | Status: DC

## 2014-03-18 NOTE — Progress Notes (Signed)
Patient ID: Monica Jensen, female   DOB: 08-11-1965, 48 y.o.   MRN: [id number] [facility] [id number] Progress Note  Monica Jensen [id number] 48 y.o.  03/18/2014 1:37 PM  Chief Complaint: symptoms are 10x better  History of Present Illness:  States there was an immediate change once she stopped Seroquel and switched to Lamictal. Mood, energy and functioning are significantly improved. Lamictal makes her feel wired. Pt is sleeping only a few hours but energy is ok.   Irritability is decreased but she thinks she might be b/c she is becoming perimenopausal and due to stress with family. Her son has MH issues and he is having a bad time.   Depression is overall better. A few times a week she feels "somber" but not depressed. Some days she is energetic and productive and other days she is less motivated. Concentration is better and she is doing well at work. Andehonia, worthlessness and hopelessness are improved.   Recent Psychological testing show deficits in long term and short term memory and immediate recall. Pt states even though concentration is better memory is still problem.   Pt is taking Lamictal and Celexa daily as prescribed. She rarely takes Vistaril. States Lamictal makes her feel "wired" and is causing insomnia.  Suicidal Ideation: No Plan Formed: No Patient has means to carry out plan: No  Homicidal Ideation: No Plan Formed: No Patient has means to carry out plan: No  Review of Systems: Psychiatric: Agitation: Yes Hallucination: No Depressed Mood: No Insomnia: Yes Hypersomnia: No Altered Concentration: No Feels Worthless: No Grandiose Ideas: No Belief In Special Powers: No New/Increased Substance Abuse: No Compulsions: No  Neurologic: Headache: No Seizure: No Paresthesias: No  Past Medical Family, Social History: lives with fiance and her 13yo son. Drinks 2-3 glasses of wine 2-3 times a week. She smokes 3-4 cigarettes every few days. Has been smoking  since age 22. Denies illicit drug use. Reports strong family hx of depression and anxiety and alcohol abuse.   Past Medical History  Diagnosis Date  . Thyroid disease   . Depression   . GERD (gastroesophageal reflux disease)   . Hypothyroidism   . Headache(784.0)     migraines  . Anemia   . Complication of anesthesia     Epinephrine"tachycardia"-dentist  . History of kidney stones     passed  . Concussion with brief LOC   . Memory loss   . 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 Fears memory loss. Questions  About  Recent memory, loss related to [place].     Outpatient Encounter Prescriptions as of 03/18/2014  Medication Sig  . citalopram (CELEXA) 20 MG tablet Take 2 tablets (40 mg total) by mouth daily.  . hydrocortisone (ANUSOL-HC) 2.5 % rectal cream Place 1 application rectally 2 (two) times daily.  Marland Kitchen ibuprofen (ADVIL,MOTRIN) 200 MG tablet Take 600 mg by mouth every 6 (six) hours as needed.  . lamoTRIgine (LAMICTAL) 25 MG tablet Take  po qD for 14 days then increase to  po qD for 14days then increase to  po qD for 14 days then increase to  po qD for mood lability  . levothyroxine (SYNTHROID, LEVOTHROID) 88 MCG tablet Take 88 mcg by mouth daily before breakfast.  . pantoprazole (PROTONIX) 40 MG tablet Take 40 mg by mouth daily.  . clindamycin (CLEOCIN) 75 MG capsule Take 75 mg by mouth 4 (four) times  daily.  Marland Kitchen GAVILYTE-N WITH FLAVOR PACK 420 G solution   . hydrOXYzine (VISTARIL) 25 MG capsule Take 25-50mg  po qHS prn insomnia  . naproxen (NAPROSYN) 250 MG tablet Take 250 mg by mouth 3 (three) times daily with meals.    Past Psychiatric History/Hospitalization(s): Anxiety: yes Bipolar Disorder: No Depression: Yes Mania: No  Psychosis: No  Schizophrenia: No  Personality Disorder: No  Hospitalization for psychiatric illness: Yes  History of Electroconvulsive Shock  Therapy: No  Prior Suicide Attempts: No   Physical Exam: Constitutional:  BP 114/82  Pulse 87  Ht 5' 3.5" (1.613 m)  Wt 110 lb 12.8 oz (50.259 kg)  BMI 19.32 kg/m2  General Appearance: alert, oriented, no acute distress  Musculoskeletal: Strength & Muscle Tone: within normal limits Gait & Station: normal Patient leans: N/A  Mental Status Examination/Evaluation: Objective: Attitude: Calm and cooperative  Appearance: Fairly Groomed, appears to be stated age  Eye Contact::  Good  Speech:  Clear and Coherent and Normal Rate  Volume:  Normal  Mood:  euthymic  Affect:  Full Range- smiling and laughing appropriately thru out appt  Thought Process:  Goal Directed, Linear and Logical  Orientation:  Full (Time, Place, and Person)  Thought Content:  Negative  Suicidal Thoughts:  No  Homicidal Thoughts:  No  Judgement:  Good  Insight:  Good  Concentration: good  Memory: Immediate-limited Recent-fair Remote-poor  Recall: fair  Language: fair  Gait and Station: normal  [employer] of Knowledge: average  Psychomotor Activity:  Normal  Akathisia:  No  Handed:  Right     Medical Decision Making (Choose Three): Established Problem, Stable/Improving (1), Review of Psycho-Social Stressors (1), Review of Medication Regimen & Side Effects (2) and Review of New Medication or Change in Dosage (2)  Assessment: Axis I: MDD, r/o Bipolar disorder, type II depressed mood.  Axis II: deferred Axis III:  Past Medical History  Diagnosis Date  . Thyroid disease   . Depression   . GERD (gastroesophageal reflux disease)   . Hypothyroidism   . Headache(784.0)     migraines  . Anemia   . Complication of anesthesia     Epinephrine"tachycardia"-dentist  . History of kidney stones     passed  . Concussion with brief LOC   . Memory loss   . 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 Fears memory  loss. Questions  About  Recent memory, loss related to [place].   Axis IV: family issues  Axis V: 60   Plan: Continue Celexa to  po qD for mood Continue Lamictal  and increase to target goal  qd for mood issues Continue Vistaril  po qHS prn insomnia.  -Risks and benefits, side effects and alternatives discussed with patient, pt was given an opportunity to ask questions about medication, illness, and treatment. All current psychiatric medications have been reviewed and discussed with the patient and adjusted as clinically appropriate. The patient has been provided an accurate and updated list of the medications being now prescribed.  Labs- pt will sign MR to get recent labs sent to clinic  Therapy: brief supportive therapy provided. Discussed psychosocial stressors in detail.  Pt plans to start therapy again soon.   Pt denies SI and is at an acute low risk for suicide.Patient told to call clinic if any problems occur. Patient advised to go to ER  if she should develop SI/HI, side effects,  or if symptoms worsen. Has crisis numbers to call if needed. Pt verbalized understanding.  F/up in 3 months or sooner if needed  Oletta Darter, MD 03/18/2014

## 2014-03-28 ENCOUNTER — Other Ambulatory Visit: Payer: Self-pay | Admitting: Physician Assistant

## 2014-05-08 ENCOUNTER — Telehealth (HOSPITAL_COMMUNITY): Payer: Self-pay | Admitting: *Deleted

## 2014-05-08 DIAGNOSIS — F331 Major depressive disorder, recurrent, moderate: Secondary | ICD-10-CM

## 2014-05-08 MED ORDER — CITALOPRAM HYDROBROMIDE 40 MG PO TABS: 40.0000 mg | ORAL_TABLET | Freq: Every day | ORAL | Status: DC

## 2014-05-08 MED ORDER — LAMOTRIGINE 100 MG PO TABS: 100.0000 mg | ORAL_TABLET | Freq: Every day | ORAL | Status: DC

## 2014-05-08 NOTE — Telephone Encounter (Signed)
Requests refills for Celexa and Lamictal to be sent to [facility]. Only allowed 2 prescriptions of each at local pharmacy per insurance - must send to mail order now. Patient also states that she wanted MD to know she tried the Vistaril 25 mg and it was too strong - knocked her out. Had some Vistaril 10 mg frompast prescription, tried that dose and it was not strong enough. She is going to try 15 mg to see if that works and discuss with MD at appt on 12/8/

## 2014-06-10 ENCOUNTER — Other Ambulatory Visit: Payer: Self-pay | Admitting: Physician Assistant

## 2014-06-17 ENCOUNTER — Encounter (HOSPITAL_COMMUNITY): Payer: Self-pay | Admitting: Psychiatry

## 2014-06-17 ENCOUNTER — Ambulatory Visit (INDEPENDENT_AMBULATORY_CARE_PROVIDER_SITE_OTHER): Payer: Managed Care, Other (non HMO) | Admitting: Psychiatry

## 2014-06-17 VITALS — BP 115/69 | HR 63 | Ht 64.0 in | Wt 111.2 lb

## 2014-06-17 DIAGNOSIS — G4700 Insomnia, unspecified: Secondary | ICD-10-CM

## 2014-06-17 DIAGNOSIS — F331 Major depressive disorder, recurrent, moderate: Secondary | ICD-10-CM

## 2014-06-17 MED ORDER — LAMOTRIGINE 100 MG PO TABS: 100.0000 mg | ORAL_TABLET | Freq: Every day | ORAL | Status: DC

## 2014-06-17 MED ORDER — ESZOPICLONE 2 MG PO TABS: 2.0000 mg | ORAL_TABLET | Freq: Every evening | ORAL | Status: DC | PRN

## 2014-06-17 MED ORDER — CITALOPRAM HYDROBROMIDE 40 MG PO TABS: 40.0000 mg | ORAL_TABLET | Freq: Every day | ORAL | Status: DC

## 2014-06-17 NOTE — Progress Notes (Signed)
[facility] [id number] Progress Note  Monica Jensen [id number] 48 y.o.  06/17/2014 3:05 PM  Chief Complaint: sleep is still poor  History of Present Illness:  Reports Vistaril 25mg  was too strong and made her feel drugged up the next morning. She cut back to 10mg  then 20mg  and it is not helping her sleep. Reports she is getting about 4 hrs of sleep and having weird dreams.   Pt broke her foot 2.5 weeks ago. She feel on the side walk wearing heals.  Depression is better. She has a few day of feeling down but it was situational. Denies isolation, crying spells, anhedonia and worthlessness. Energy is low since breaking her foot. Appetite and concenttation are good.   Denies manic and hypomanic symptoms including periods of decreased need for sleep, increased energy, mood lability, impulsivity, FOI, and excessive spending.  Irritability is significantly decreased.  Pt is taking Lamictal and Celexa daily as prescribed. States Lamictal makes her feel "wired" and is causing insomnia.  Suicidal Ideation: No Plan Formed: No Patient has means to carry out plan: No  Homicidal Ideation: No Plan Formed: No Patient has means to carry out plan: No  Review of Systems: Psychiatric: Agitation: No Hallucination: No Depressed Mood: No Insomnia: Yes Hypersomnia: No Altered Concentration: No Feels Worthless: No Grandiose Ideas: No Belief In Special Powers: No New/Increased Substance Abuse: No Compulsions: No  Neurologic: Headache: No Seizure: No Paresthesias: No   Review of Systems  Constitutional: Negative for fever, chills and weight loss.  HENT: Negative for congestion, nosebleeds, sore throat and tinnitus.   Eyes: Negative for blurred vision, double vision and redness.  Respiratory: Negative for cough, sputum production and shortness of breath.   Cardiovascular: Negative for chest pain, palpitations and leg swelling.  Gastrointestinal: Negative for heartburn, nausea,  vomiting and abdominal pain.  Musculoskeletal: Positive for joint pain and falls. Negative for myalgias.  Skin: Negative for itching and rash.  Neurological: Negative for dizziness, tingling, sensory change, seizures, weakness and headaches.  Psychiatric/Behavioral: Negative for depression, suicidal ideas, hallucinations and substance abuse. The patient has insomnia. The patient is not nervous/anxious.      Past Medical Family, Social History: lives with fiance and her 13yo son. Drinks 2-3 glasses of wine 2-3 times a week. She smokes 3-4 cigarettes every few days. Has been smoking since age 46. Denies illicit drug use. Reports strong family hx of depression and anxiety and alcohol abuse.   Past Medical History  Diagnosis Date  . Thyroid disease   . Depression   . GERD (gastroesophageal reflux disease)   . Hypothyroidism   . Headache(784.0)     migraines  . Anemia   . Complication of anesthesia     Epinephrine"tachycardia"-dentist  . History of kidney stones     passed  . Concussion with brief LOC   . Memory loss   . 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 Fears memory loss. Questions  About  Recent memory, loss related to [place].   . Broken foot     Outpatient Encounter Prescriptions as of 06/17/2014  Medication Sig  . citalopram (CELEXA) 40 MG tablet Take 1 tablet (40 mg total) by mouth daily.  . clindamycin (CLEOCIN) 75 MG capsule Take 75 mg by mouth 4 (four) times daily.  Marland Kitchen GAVILYTE-N WITH FLAVOR PACK 420 G solution   . hydrocortisone (ANUSOL-HC) 2.5 % rectal cream Place 1 application  rectally 2 (two) times daily.  . hydrOXYzine (VISTARIL) 25 MG capsule Take 25-50mg  po qHS prn insomnia  . ibuprofen (ADVIL,MOTRIN) 200 MG tablet Take 600 mg by mouth every 6 (six) hours as needed.  . lamoTRIgine (LAMICTAL) 100 MG tablet Take 1 tablet (100 mg total) by mouth daily.  Marland Kitchen. levothyroxine  (SYNTHROID, LEVOTHROID) 88 MCG tablet Take 88 mcg by mouth daily before breakfast.  . naproxen (NAPROSYN) 250 MG tablet Take 250 mg by mouth 3 (three) times daily with meals.  . pantoprazole (PROTONIX) 40 MG tablet Take 40 mg by mouth daily.    Past Psychiatric History/Hospitalization(s): Anxiety: yes Bipolar Disorder: No Depression: Yes Mania: No  Psychosis: No  Schizophrenia: No  Personality Disorder: No  Hospitalization for psychiatric illness: Yes  History of Electroconvulsive Shock Therapy: No  Prior Suicide Attempts: No   Physical Exam: Constitutional:  BP 115/69 mmHg  Pulse 63  Ht 5\' 4"  (1.626 m)  Wt 111 lb 3.2 oz (50.44 kg)  BMI 19.08 kg/m2  General Appearance: alert, oriented, no acute distress  Musculoskeletal: Strength & Muscle Tone: within normal limits Gait & Station: normal Patient leans: N/A  Mental Status Examination/Evaluation: Objective: Attitude: Calm and cooperative  Appearance: Fairly Groomed, appears to be stated age  Eye Contact::  Good  Speech:  Clear and Coherent and Normal Rate  Volume:  Normal  Mood:  euthymic  Affect:  Full Range- smiling and laughing appropriately thru out appt  Thought Process:  Goal Directed, Linear and Logical  Orientation:  Full (Time, Place, and Person)  Thought Content:  Negative  Suicidal Thoughts:  No  Homicidal Thoughts:  No  Judgement:  Good  Insight:  Good  Concentration: good  Memory: Immediate-limited Recent-fair Remote-poor  Recall: fair  Language: fair  Gait and Station: normal  [employer] of Knowledge: average  Psychomotor Activity:  Normal  Akathisia:  No  Handed:  Right   AIMS (if indicated): n/a  Assets:  Manufacturing systems engineerCommunication Skills Desire for Improvement Financial Resources/Insurance Housing Intimacy Leisure Time Physical Health Resilience Social Support MicrobiologistTalents/Skills Transportation Vocational/Educational      Medical Decision Making (Choose Three): Established Problem,  Stable/Improving (1), Review of Psycho-Social Stressors (1), Review of Medication Regimen & Side Effects (2) and Review of New Medication or Change in Dosage (2)  Assessment: Axis I: MDD- recurrent, moderate; r/o Bipolar disorder, type II depressed mood.  Axis II: deferred Axis III:  Past Medical History  Diagnosis Date  . Thyroid disease   . Depression   . GERD (gastroesophageal reflux disease)   . Hypothyroidism   . Headache(784.0)     migraines  . Anemia   . Complication of anesthesia     Epinephrine"tachycardia"-dentist  . History of kidney stones     passed  . Concussion with brief LOC   . Memory loss   . 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 Fears memory loss. Questions  About  Recent memory, loss related [place].   . Broken foot   Axis IV: family issues  Axis V: 60   Plan: Continue Celexa to 40mg  po qD for mood Continue Lamictal 100mg  po qd for mood issues D/c Vistaril  Start trial of Lunesta 2mg  po qHS prn insomnia.  -Risks and benefits, side effects and alternatives discussed with patient, pt was given an opportunity to ask questions about medication, illness, and treatment. All current psychiatric medications have been reviewed  and discussed with the patient and adjusted as clinically appropriate. The patient has been provided an accurate and updated list of the medications being now prescribed.  Labs- pt will sign MR to get recent labs sent to clinic  Therapy: brief supportive therapy provided. Discussed psychosocial stressors in detail. Pt is working with Sharlette DenseMickey Dew for therapy  Recent Psychological testing show deficits in long term and short term memory and immediate recall.   Pt denies SI and is at an acute low risk for suicide.Patient told to call clinic if any problems occur. Patient advised to go to ER  if she should develop SI/HI, side effects, or if symptoms  worsen. Has crisis numbers to call if needed. Pt verbalized understanding.  F/up in 3 months or sooner if needed  Oletta DarterAGARWAL, Paquita Printy, MD 06/17/2014

## 2014-06-18 ENCOUNTER — Encounter (HOSPITAL_BASED_OUTPATIENT_CLINIC_OR_DEPARTMENT_OTHER): Payer: Self-pay | Admitting: *Deleted

## 2014-06-18 NOTE — Progress Notes (Signed)
No labs needed

## 2014-06-18 NOTE — H&P (Signed)
[facility] [address]   [address] [place], [place] [phone] A Division of [facility]  Loreta Aveaniel F. Tamarah Bhullar, M.D.   Robert A. Thurston HoleWainer, M.D.   Burnell BlanksW. Dan Caffrey, M.D.   Eulas PostJoshua P. Landau, M.D.   Lunette StandsAnna Voytek, M.D Jewel Baizeimothy D. Eulah PontMurphy, M.D.  Buford DresserWesley R. Ibazebo, M.D.  Estell HarpinJames S. Kramer, M.D.    Melina Fiddlerebecca S. Bassett, M.D. Mary L. Isidoro DonningAnton, PA-C  Kirstin A. Shepperson, PA-C  Josh Timnathhadwell, PA-C [place], [place]   RE: Monica Jensen, Monica Jensen                                [phone]      DOB: 1966/02/10 PROGRESS NOTE: 06-03-14 SUBJECTIVE: Monica DroneBrenda is a 48 year-old female, new patient to me who comes to the office with complaints of right ankle and foot pain.  She was at a function over the weekend when she made an awkward misstep on a crack in the edge of the sidewalk while wearing heels, she rolled her ankle and felt a pop.  With swelling and bruising and difficulty bearing weight, she comes into the office for further evaluation.  Her health upon review shows a Penicillin allergy.  No history of diabetes.  No history of hyperlipidemia.  Unremarkable past medical history.  On no chronic medications.  Review of systems is significant for insomnia and headaches.  Past surgical history is unremarkable.  Family history is negative upon review.  She is a nonsmoker, married.  OBJECTIVE: Height: 5?3.  Weight: 110 pounds.  Difficult time weight bearing to the right lower extremity.  Tenderness, ecchymosis and soft tissue swelling over the fifth metatarsal.  Mild tenderness over the distal fibula, not marked.  The ankle is stable.  She is neurovascularly intact distally.     X-RAYS: Two views of the ankle are unremarkable.  Three views of the right foot show a proximal fifth metatarsal fracture.    ASSESSMENT: Right foot proximal fifth metatarsal fracture, avulsion type.  PLAN: Crutches for assisted weight bearing.  Norco 10/325 #30 for pain.  A CAM  walker.  3-4 week follow up.  Repeat plain radiographs of her foot at that time.    Estell HarpinJames S. Kramer, M.D.   Electronically verified by Estell HarpinJames S. Kramer, M.D. JSK:jjh D 06-03-14 T 06-04-14 [facility] [address]   [address] [place], [place] [phone] A Division of [facility]  Loreta Aveaniel F. Raliegh Scobie, M.D.   Robert A. Thurston HoleWainer, M.D.   Burnell BlanksW. Dan Caffrey, M.D.   Eulas PostJoshua P. Landau, M.D.   Lunette StandsAnna Voytek, M.D Jewel Baizeimothy D. Eulah PontMurphy, M.D.  Buford DresserWesley R. Ibazebo, M.D.  Estell HarpinJames S. Kramer, M.D.    Melina Fiddlerebecca S. Bassett, M.D. Mary L. Isidoro DonningAnton, PA-C  Kirstin A. Shepperson, PA-C  Josh Carrizo Hillhadwell, PA-C [place], [place]   RE: Monica Jensen, Monica Jensen                                [phone]      DOB: 1966/02/10 PROGRESS NOTE: 06-10-14 Monica DroneBrenda comes in to discuss definitive treatment of her right foot fifth metatarsal injury.  I saw her x-rays briefly and reviewed her case with Dr. Farris HasKramer when she was in earlier.  This is a traumatic fracture base of the fifth metatarsal.  This is avulsed off and displaced a good 5-6 mm.  It  is right at the borderline of an avulsion versus proximal shaft fracture.  She is an avid runner.  All treatment options have been discussed with her.  After talking with her husband and giving this a lot of thought she comes in to see me to discuss operative intervention.  She is using a CAM walker and CAM ambulate in this, but she states she can still feel the fracture move, even in her foot.  Obviously the sooner she gets better the sooner she can return to activity the better.   Her entire history and general exam has been reviewed, updated and included in the chart.  She is otherwise in great healthy.    EXAMINATION: Specifically, she is point tender at the base of the fifth metatarsal, right foot.  Swelling better.  Neurovascularly intact.    DISPOSITION:  More than 25 minutes spent face-to-face discussing options with Monica DroneBrenda and her  husband.  She is opting for open reduction internal fixation, which I think gives us a better likelihood of earlier solid healing.  Fracture fragment is very proximal and the only question is going to be what I am going to use to fix this.  I don't think I can use a standard [place] screw and we will probably use a bicortical 3.5 or 4.0 screw fixation.  I have told her to be prepared for up to six weeks non-weight bearing post-op.  All procedure, risks, benefits and complications reviewed in detail.  All paperwork complete.  All questions answered.  I will see her at the time of operative intervention.    Loreta Aveaniel F. Kaia Depaolis, M.D.   Electronically verified by Loreta Aveaniel F. Henderson Frampton, M.D. DFM:jjh D 06-10-14 T 06-11-14

## 2014-06-19 ENCOUNTER — Encounter (HOSPITAL_BASED_OUTPATIENT_CLINIC_OR_DEPARTMENT_OTHER): Payer: Self-pay | Admitting: *Deleted

## 2014-06-19 ENCOUNTER — Encounter (HOSPITAL_BASED_OUTPATIENT_CLINIC_OR_DEPARTMENT_OTHER)
Admission: RE | Disposition: A | Discharge status: Home / Self Care | Payer: Self-pay | Source: Ambulatory Visit | Attending: Orthopedic Surgery

## 2014-06-19 ENCOUNTER — Ambulatory Visit (HOSPITAL_BASED_OUTPATIENT_CLINIC_OR_DEPARTMENT_OTHER)
Admission: RE | Admit: 2014-06-19 | Discharge: 2014-06-19 | Disposition: A | Discharge status: Home / Self Care | Payer: Managed Care, Other (non HMO) | Source: Ambulatory Visit | Attending: Orthopedic Surgery | Admitting: Orthopedic Surgery

## 2014-06-19 ENCOUNTER — Ambulatory Visit (HOSPITAL_BASED_OUTPATIENT_CLINIC_OR_DEPARTMENT_OTHER): Payer: Managed Care, Other (non HMO) | Admitting: Anesthesiology

## 2014-06-19 DIAGNOSIS — S92301A Fracture of unspecified metatarsal bone(s), right foot, initial encounter for closed fracture: Secondary | ICD-10-CM | POA: Diagnosis present

## 2014-06-19 DIAGNOSIS — Y999 Unspecified external cause status: Secondary | ICD-10-CM | POA: Diagnosis not present

## 2014-06-19 DIAGNOSIS — X58XXXA Exposure to other specified factors, initial encounter: Secondary | ICD-10-CM | POA: Diagnosis not present

## 2014-06-19 DIAGNOSIS — Y939 Activity, unspecified: Secondary | ICD-10-CM | POA: Insufficient documentation

## 2014-06-19 DIAGNOSIS — S92351A Displaced fracture of fifth metatarsal bone, right foot, initial encounter for closed fracture: Secondary | ICD-10-CM | POA: Insufficient documentation

## 2014-06-19 DIAGNOSIS — Y929 Unspecified place or not applicable: Secondary | ICD-10-CM | POA: Diagnosis not present

## 2014-06-19 HISTORY — PX: ORIF TOE FRACTURE: SHX5032

## 2014-06-19 SURGERY — OPEN REDUCTION INTERNAL FIXATION (ORIF) METATARSAL (TOE) FRACTURE
Anesthesia: Regional | Site: Foot | Laterality: Right

## 2014-06-19 MED ORDER — MIDAZOLAM HCL 2 MG/2ML IJ SOLN
INTRAMUSCULAR | Status: AC
  Filled 2014-06-19: qty 2

## 2014-06-19 MED ORDER — PROPOFOL 10 MG/ML IV BOLUS
INTRAVENOUS | Status: DC | PRN
  Administered 2014-06-19: 100 mg via INTRAVENOUS

## 2014-06-19 MED ORDER — ROPIVACAINE HCL 5 MG/ML IJ SOLN
INTRAMUSCULAR | Status: DC | PRN
  Administered 2014-06-19: 22 mL

## 2014-06-19 MED ORDER — HYDROMORPHONE HCL 1 MG/ML IJ SOLN
INTRAMUSCULAR | Status: AC
  Filled 2014-06-19: qty 1

## 2014-06-19 MED ORDER — FENTANYL CITRATE 0.05 MG/ML IJ SOLN
INTRAMUSCULAR | Status: AC
  Filled 2014-06-19: qty 2

## 2014-06-19 MED ORDER — FENTANYL CITRATE 0.05 MG/ML IJ SOLN
INTRAMUSCULAR | Status: AC
  Filled 2014-06-19: qty 6

## 2014-06-19 MED ORDER — OXYCODONE HCL 5 MG/5ML PO SOLN: 5.0000 mg | Freq: Once | ORAL | Status: DC | PRN

## 2014-06-19 MED ORDER — DEXAMETHASONE SODIUM PHOSPHATE 4 MG/ML IJ SOLN
INTRAMUSCULAR | Status: DC | PRN
  Administered 2014-06-19: 10 mg via INTRAVENOUS

## 2014-06-19 MED ORDER — LACTATED RINGERS IV SOLN
INTRAVENOUS | Status: DC
  Administered 2014-06-19: 11:00:00 via INTRAVENOUS

## 2014-06-19 MED ORDER — CLINDAMYCIN PHOSPHATE 900 MG/50ML IV SOLN
900.0000 mg | INTRAVENOUS | Status: AC
  Administered 2014-06-19: 900 mg via INTRAVENOUS

## 2014-06-19 MED ORDER — CHLORHEXIDINE GLUCONATE 4 % EX LIQD: 60.0000 mL | Freq: Once | CUTANEOUS | Status: DC

## 2014-06-19 MED ORDER — OXYCODONE HCL 5 MG PO TABS: 5.0000 mg | ORAL_TABLET | Freq: Once | ORAL | Status: DC | PRN

## 2014-06-19 MED ORDER — HYDROMORPHONE HCL 1 MG/ML IJ SOLN
0.2500 mg | INTRAMUSCULAR | Status: DC | PRN
  Administered 2014-06-19 (×3): 0.5 mg via INTRAVENOUS

## 2014-06-19 MED ORDER — LACTATED RINGERS IV SOLN
INTRAVENOUS | Status: DC
  Administered 2014-06-19: 10:00:00 via INTRAVENOUS

## 2014-06-19 MED ORDER — FENTANYL CITRATE 0.05 MG/ML IJ SOLN
50.0000 ug | INTRAMUSCULAR | Status: DC | PRN
  Administered 2014-06-19: 100 ug via INTRAVENOUS

## 2014-06-19 MED ORDER — CLINDAMYCIN PHOSPHATE 900 MG/50ML IV SOLN
INTRAVENOUS | Status: AC
  Filled 2014-06-19: qty 50

## 2014-06-19 MED ORDER — MIDAZOLAM HCL 2 MG/2ML IJ SOLN
1.0000 mg | INTRAMUSCULAR | Status: DC | PRN
  Administered 2014-06-19: 2 mg via INTRAVENOUS

## 2014-06-19 MED ORDER — LIDOCAINE HCL (CARDIAC) 20 MG/ML IV SOLN
INTRAVENOUS | Status: DC | PRN
  Administered 2014-06-19: 40 mg via INTRAVENOUS

## 2014-06-19 SURGICAL SUPPLY — 60 items
BANDAGE ELASTIC 4 VELCRO ST LF (GAUZE/BANDAGES/DRESSINGS) ×2 IMPLANT
BENZOIN TINCTURE PRP APPL 2/3 (GAUZE/BANDAGES/DRESSINGS) IMPLANT
BIT DRILL CANN 2.7X625 NONSTRL (BIT) ×2 IMPLANT
BLADE SURG 15 STRL LF DISP TIS (BLADE) ×1 IMPLANT
BLADE SURG 15 STRL SS (BLADE) ×1
BNDG COHESIVE 4X5 TAN STRL (GAUZE/BANDAGES/DRESSINGS) ×2 IMPLANT
BNDG ESMARK 4X9 LF (GAUZE/BANDAGES/DRESSINGS) ×2 IMPLANT
CHLORAPREP W/TINT 26ML (MISCELLANEOUS) ×2 IMPLANT
COVER BACK TABLE 60X90IN (DRAPES) ×2 IMPLANT
COVER MAYO STAND STRL (DRAPES) ×2 IMPLANT
CUFF TOURNIQUET SINGLE 34IN LL (TOURNIQUET CUFF) ×2 IMPLANT
DECANTER SPIKE VIAL GLASS SM (MISCELLANEOUS) IMPLANT
DRAPE EXTREMITY T 121X128X90 (DRAPE) ×2 IMPLANT
DRAPE OEC MINIVIEW 54X84 (DRAPES) ×2 IMPLANT
DRAPE SURG 17X23 STRL (DRAPES) ×2 IMPLANT
DRAPE U 20/CS (DRAPES) ×2 IMPLANT
DRAPE U-SHAPE 47X51 STRL (DRAPES) IMPLANT
DURAPREP 26ML APPLICATOR (WOUND CARE) IMPLANT
ELECT REM PT RETURN 9FT ADLT (ELECTROSURGICAL) ×2
ELECTRODE REM PT RTRN 9FT ADLT (ELECTROSURGICAL) ×1 IMPLANT
GAUZE SPONGE 4X4 12PLY STRL (GAUZE/BANDAGES/DRESSINGS) ×2 IMPLANT
GAUZE XEROFORM 1X8 LF (GAUZE/BANDAGES/DRESSINGS) IMPLANT
GLOVE BIOGEL M STRL SZ7.5 (GLOVE) ×2 IMPLANT
GLOVE BIOGEL PI IND STRL 7.0 (GLOVE) ×2 IMPLANT
GLOVE BIOGEL PI INDICATOR 7.0 (GLOVE) ×2
GLOVE ECLIPSE 6.5 STRL STRAW (GLOVE) ×2 IMPLANT
GLOVE ECLIPSE 7.0 STRL STRAW (GLOVE) ×2 IMPLANT
GLOVE ORTHO TXT STRL SZ7.5 (GLOVE) ×4 IMPLANT
GOWN STRL REUS W/ TWL LRG LVL3 (GOWN DISPOSABLE) ×3 IMPLANT
GOWN STRL REUS W/TWL LRG LVL3 (GOWN DISPOSABLE) ×3
GOWN STRL REUS W/TWL XL LVL3 (GOWN DISPOSABLE) ×2 IMPLANT
GUIDEWIRE THREADED 150MM (WIRE) ×2 IMPLANT
NEEDLE HYPO 25X1 1.5 SAFETY (NEEDLE) ×2 IMPLANT
NS IRRIG 1000ML POUR BTL (IV SOLUTION) ×2 IMPLANT
PACK BASIN DAY SURGERY FS (CUSTOM PROCEDURE TRAY) ×2 IMPLANT
PAD CAST 4YDX4 CTTN HI CHSV (CAST SUPPLIES) ×1 IMPLANT
PADDING CAST ABS 4INX4YD NS (CAST SUPPLIES) ×1
PADDING CAST ABS COTTON 4X4 ST (CAST SUPPLIES) ×1 IMPLANT
PADDING CAST COTTON 4X4 STRL (CAST SUPPLIES) ×1
PENCIL BUTTON HOLSTER BLD 10FT (ELECTRODE) ×2 IMPLANT
SCREW CANN L THRD/40 4.0 (Screw) ×2 IMPLANT
SLEEVE SCD COMPRESS KNEE MED (MISCELLANEOUS) IMPLANT
SPLINT PLASTER CAST XFAST 3X15 (CAST SUPPLIES) IMPLANT
SPLINT PLASTER XTRA FASTSET 3X (CAST SUPPLIES)
SPONGE LAP 4X18 X RAY DECT (DISPOSABLE) ×2 IMPLANT
STOCKINETTE 4X48 STRL (DRAPES) IMPLANT
STOCKINETTE 6  STRL (DRAPES) ×1
STOCKINETTE 6 STRL (DRAPES) ×1 IMPLANT
STRIP CLOSURE SKIN 1/2X4 (GAUZE/BANDAGES/DRESSINGS) IMPLANT
SUT ETHILON 3 0 PS 1 (SUTURE) ×2 IMPLANT
SUT MON AB 4-0 PC3 18 (SUTURE) IMPLANT
SUT VIC AB 2-0 SH 27 (SUTURE)
SUT VIC AB 2-0 SH 27XBRD (SUTURE) IMPLANT
SUT VIC AB 3-0 FS2 27 (SUTURE) IMPLANT
SYR 20CC LL (SYRINGE) IMPLANT
SYR BULB 3OZ (MISCELLANEOUS) ×2 IMPLANT
TOWEL OR 17X24 6PK STRL BLUE (TOWEL DISPOSABLE) ×2 IMPLANT
TUBE CONNECTING 20X1/4 (TUBING) IMPLANT
UNDERPAD 30X30 INCONTINENT (UNDERPADS AND DIAPERS) ×2 IMPLANT
WASHER 7MM DIA (Washer) ×2 IMPLANT

## 2014-06-19 NOTE — Anesthesia Procedure Notes (Addendum)
Anesthesia Regional Block:  Ankle block  Pre-Anesthetic Checklist: ,, timeout performed, Correct Patient, Correct Site, Correct Laterality, Correct Procedure, Correct Position, site marked, Risks and benefits discussed, pre-op evaluation, post-op pain management  Laterality: Right  Prep: Maximum Sterile Barrier Precautions used and chloraprep       Needles:  Injection technique: Single-shot  Needle Type: Other     Needle Length: 3cm  Needle Gauge: 25 and 25 G    Additional Needles: Ankle block Narrative:  Start time: 06/19/2014 10:40 AM End time: 06/19/2014 10:46 AM Injection made incrementally with aspirations every 5 mL.  Performed by: Personally  Anesthesiologist: Gaynelle AduFITZGERALD, WILLIAM E  Additional Notes: Superficial Peroneal and Sural injected. Negative aspiration of blood prior to all injections.   Procedure Name: LMA Insertion Performed by: York GricePEARSON, Aeriel Boulay W Pre-anesthesia Checklist: Patient identified, Timeout performed, Emergency Drugs available, Suction available and Patient being monitored Patient Re-evaluated:Patient Re-evaluated prior to inductionOxygen Delivery Method: Circle system utilized Preoxygenation: Pre-oxygenation with 100% oxygen Intubation Type: IV induction Ventilation: Mask ventilation without difficulty LMA: LMA inserted LMA Size: 4.0 Tube type: Oral Number of attempts: 1 Placement Confirmation: positive ETCO2 Tube secured with: Tape Dental Injury: Teeth and Oropharynx as per pre-operative assessment

## 2014-06-19 NOTE — Transfer of Care (Signed)
Immediate Anesthesia Transfer of Care Note  Patient: Monica BurkeBrenda W Jensen  Procedure(s) Performed: Procedure(s): RIGHT FOOT OPEN REDUCTION INTERNAL FIXATION (ORIF) FIFTH METATARSAL  (Right)  Patient Location: PACU  Anesthesia Type:General  Level of Consciousness: awake, alert  and oriented  Airway & Oxygen Therapy: Patient Spontanous Breathing and Patient connected to face mask oxygen  Post-op Assessment: Report given to PACU RN and Post -op Vital signs reviewed and stable  Post vital signs: Reviewed and stable  Complications: No apparent anesthesia complications

## 2014-06-19 NOTE — Op Note (Deleted)
NAMJoylene Igo:  Jensen, Monica               ACCOUNT NO.:  [id number]  MEDICAL RECORD NO.:  [id number]  LOCATION:                                FACILITY:  [facility]  PHYSICIAN:  Loreta Aveaniel F. Karthikeya Funke, M.D. DATE OF BIRTH:  04-26-1966  DATE OF PROCEDURE:  06/19/2014 DATE OF DISCHARGE:  06/19/2014                              OPERATIVE REPORT   PREOPERATIVE DIAGNOSIS:  Right foot displaced avulsion fracture, proximal fifth metatarsal, closed injury.  POSTOPERATIVE DIAGNOSIS:  Right foot displaced avulsion fracture, proximal fifth metatarsal, closed injury.  PROCEDURE:  Open reduction and internal fixation of fifth metatarsal fracture utilizing a cannulated 4.0 screw and washer.  SURGEON:  Loreta Aveaniel F. Aviona Martenson, M.D.  ASSISTANT:  Odelia GageLindsey Anton, PA  ANESTHESIA:  General.  BLOOD LOSS:  Minimal.  SPECIMENS:  None.  CULTURES:  None.  COMPLICATIONS:  None.  DRESSING:  Soft compressive short leg splint.  TOURNIQUET TIME:  45 minutes.  DESCRIPTION OF PROCEDURE:  The patient was brought to the operating room, placed on the operating table in supine position.  After adequate anesthesia had been obtained, tourniquet applied, prepped and draped in usual sterile fashion.  Exsanguinated with elevation of Esmarch. Tourniquet was inflated to 300 mmHg.  Fluoroscopic guidance used throughout.  A longitudinal incision over the base of fifth metatarsal. Skin and subcutaneous tissue divided.  Sural nerve identified and protected.  This was at the very proximal aspect of the shaft.  The fracture site was cleared.  I can reduce this anatomically.  Relatively small fragment.  This was reduced and then held in place with a guidewire, overdrilled and then fixed with a 4.0 cannulated lag screw. The fragment was so small that I had to take this screw out and a washer, two out of them, put it back in.  This captured the fragment, gave me great fixation, anatomic alignment throughout.  Wound irrigated.  Closed  with nylon.  Sterile compressive dressing applied.  Tourniquet was deflated and removed.  Short-leg splint applied.  Anesthesia reversed.  Brought to the recovery room.  Tolerated the surgery well.  No complications.     Loreta Aveaniel F. Azyria Osmon, M.D.     DFM/MEDQ  D:  06/19/2014  T:  06/19/2014  Job:  [id number]

## 2014-06-19 NOTE — Anesthesia Preprocedure Evaluation (Addendum)
Anesthesia Evaluation  Patient identified by MRN, date of birth, ID band Patient awake    Reviewed: Allergy & Precautions, H&P , NPO status , Patient's Chart, lab work & pertinent test results  Airway Mallampati: II  TM Distance: >3 FB Neck ROM: Full    Dental no notable dental hx. (+) Teeth Intact, Dental Advisory Given   Pulmonary neg pulmonary ROS, Current Smoker,  breath sounds clear to auscultation  Pulmonary exam normal       Cardiovascular negative cardio ROS  Rhythm:Regular Rate:Normal     Neuro/Psych  Headaches, Depression    GI/Hepatic Neg liver ROS, GERD-  Medicated and Controlled,  Endo/Other  Hypothyroidism   Renal/GU negative Renal ROS  negative genitourinary   Musculoskeletal   Abdominal   Peds  Hematology negative hematology ROS (+)   Anesthesia Other Findings   Reproductive/Obstetrics negative OB ROS                            Anesthesia Physical Anesthesia Plan  ASA: II  Anesthesia Plan: General and Regional   Post-op Pain Management:    Induction: Intravenous  Airway Management Planned: LMA  Additional Equipment:   Intra-op Plan:   Post-operative Plan: Extubation in OR  Informed Consent: I have reviewed the patients History and Physical, chart, labs and discussed the procedure including the risks, benefits and alternatives for the proposed anesthesia with the patient or authorized representative who has indicated his/her understanding and acceptance.   Dental advisory given  Plan Discussed with: CRNA and Surgeon  Anesthesia Plan Comments:        Anesthesia Quick Evaluation

## 2014-06-19 NOTE — Interval H&P Note (Signed)
History and Physical Interval Note:  06/19/2014 7:30 AM  Monica BurkeBrenda W Wisner  has presented today for surgery, with the diagnosis of FRACTURE OF UNSPECIFIED METATARSAL BONE (S) UNSPECIFIED FOOT INITIAL ENCOUNTER FOR CLOSED FRACTURE RIGHT FOOT S92.309A  The various methods of treatment have been discussed with the patient and family. After consideration of risks, benefits and other options for treatment, the patient has consented to  Procedure(s): RIGHT FOOT OPEN REDUCTION INTERNAL FIXATION (ORIF) METATARSAL  (Right) as a surgical intervention .  The patient's history has been reviewed, patient examined, no change in status, stable for surgery.  I have reviewed the patient's chart and labs.  Questions were answered to the patient's satisfaction.     MURPHY,DANIEL F

## 2014-06-19 NOTE — Discharge Instructions (Signed)
Do not remove splint.  Non-weight bearing for the next 6 weeks.  May shower, but do not let splint get wet.  Follow up appointment in one week.   If you have a plaster or fiberglass cast:  Do not try to scratch the skin under the cast using sharp or pointed objects.  Check the skin around the cast every day. You may put lotion on any red or sore areas.  Keep your cast dry and clean.  Do not put pressure on any part of your cast or splint until it is fully hardened.  Your cast or splint can be protected during bathing with a plastic bag. Do not lower the cast or splint into water.  Only take over-the-counter or prescription medicines for pain, discomfort, or fever as directed by your health care provider.  Use crutches as directed, and do not exercise the leg unless instructed.  Your health care provider may instruct you to remove your cam boot.  Keep follow-up appointments as directed.  Do not drive a car or operate a motor vehicle until your health care provider specifically tells you it is safe to do so. SEEK MEDICAL CARE IF:  You have redness, swelling, or increasing pain in your wound.   You have pus coming from your wound.   You notice a bad smell coming from your wound or dressing.   Your wound breaks open (edges not staying together) after sutures or staples have been removed.   You have numbness in your foot that is getting worse. If you do not have a window in your cast for observing the wound, drainage or minor bleeding may show up as a stain on the outside of your cast. Report these findings to your health care provider. SEEK IMMEDIATE MEDICAL CARE IF:  You have chest pain or shortness of breath. Document Released: 04/17/2013 Document Reviewed: 04/17/2013 [facility] Patient Information 2015 [place]. This information is not intended to replace advice given to you by your health care provider. Make sure you discuss any questions you have with your health care  provider.  Regional Anesthesia Blocks  1. Numbness or the inability to move the "blocked" extremity may last from 3-48 hours after placement. The length of time depends on the medication injected and your individual response to the medication. If the numbness is not going away after 48 hours, call your surgeon.  2. The extremity that is blocked will need to be protected until the numbness is gone and the  Strength has returned. Because you cannot feel it, you will need to take extra care to avoid injury. Because it may be weak, you may have difficulty moving it or using it. You may not know what position it is in without looking at it while the block is in effect.  3. For blocks in the legs and feet, returning to weight bearing and walking needs to be done carefully. You will need to wait until the numbness is entirely gone and the strength has returned. You should be able to move your leg and foot normally before you try and bear weight or walk. You will need someone to be with you when you first try to ensure you do not fall and possibly risk injury.  4. Bruising and tenderness at the needle site are common side effects and will resolve in a few days.  5. Persistent numbness or new problems with movement should be communicated to the surgeon or the [facility] [phone])/ [facility]  [place] [phone]).   Post Anesthesia Home Care Instructions  Activity: Get plenty of rest for the remainder of the day. A responsible adult should stay with you for 24 hours following the procedure.  For the next 24 hours, DO NOT: -Drive a car -Advertising copywriterperate machinery -Drink alcoholic beverages -Take any medication unless instructed by your physician -Make any legal decisions or sign important papers.  Meals: Start with liquid foods such as gelatin or soup. Progress to regular foods as tolerated. Avoid greasy, spicy, heavy foods. If nausea and/or vomiting occur, drink only clear liquids  until the nausea and/or vomiting subsides. Call your physician if vomiting continues.  Special Instructions/Symptoms: Your throat may feel dry or sore from the anesthesia or the breathing tube placed in your throat during surgery. If this causes discomfort, gargle with warm salt water. The discomfort should disappear within 24 hours.

## 2014-06-19 NOTE — Anesthesia Postprocedure Evaluation (Signed)
  Anesthesia Post-op Note  Patient: Monica Jensen  Procedure(s) Performed: Procedure(s): RIGHT FOOT OPEN REDUCTION INTERNAL FIXATION (ORIF) FIFTH METATARSAL  (Right)  Patient Location: PACU  Anesthesia Type:General and block  Level of Consciousness: awake and alert   Airway and Oxygen Therapy: Patient Spontanous Breathing  Post-op Pain: moderate  Post-op Assessment: Post-op Vital signs reviewed, Patient's Cardiovascular Status Stable and Respiratory Function Stable  Post-op Vital Signs: Reviewed  Filed Vitals:   06/19/14 1300  BP: 128/88  Pulse: 69  Temp:   Resp: 11    Complications: No apparent anesthesia complications

## 2014-06-19 NOTE — Op Note (Signed)
NAME:  Jensen, Monica               ACCOUNT NO.:  [id number]  MEDICAL RECORD NO.:  [id number]  LOCATION:                                FACILITY:  [facility]  PHYSICIAN:  Zeek Rostron F. Jeryl Wilbourn, M.D. DATE OF BIRTH:  01/19/1966  DATE OF PROCEDURE:  06/19/2014 DATE OF DISCHARGE:  06/19/2014                              OPERATIVE REPORT   PREOPERATIVE DIAGNOSIS:  Right foot displaced avulsion fracture, proximal fifth metatarsal, closed injury.  POSTOPERATIVE DIAGNOSIS:  Right foot displaced avulsion fracture, proximal fifth metatarsal, closed injury.  PROCEDURE:  Open reduction and internal fixation of fifth metatarsal fracture utilizing a cannulated 4.0 screw and washer.  SURGEON:  Hadassah Rana F. Orah Sonnen, M.D.  ASSISTANT:  Lindsey Anton, PA  ANESTHESIA:  General.  BLOOD LOSS:  Minimal.  SPECIMENS:  None.  CULTURES:  None.  COMPLICATIONS:  None.  DRESSING:  Soft compressive short leg splint.  TOURNIQUET TIME:  45 minutes.  DESCRIPTION OF PROCEDURE:  The patient was brought to the operating room, placed on the operating table in supine position.  After adequate anesthesia had been obtained, tourniquet applied, prepped and draped in usual sterile fashion.  Exsanguinated with elevation of Esmarch. Tourniquet was inflated to 300 mmHg.  Fluoroscopic guidance used throughout.  A longitudinal incision over the base of fifth metatarsal. Skin and subcutaneous tissue divided.  Sural nerve identified and protected.  This was at the very proximal aspect of the shaft.  The fracture site was cleared.  I can reduce this anatomically.  Relatively small fragment.  This was reduced and then held in place with a guidewire, overdrilled and then fixed with a 4.0 cannulated lag screw. The fragment was so small that I had to take this screw out and a washer, two out of them, put it back in.  This captured the fragment, gave me great fixation, anatomic alignment throughout.  Wound irrigated.  Closed  with nylon.  Sterile compressive dressing applied.  Tourniquet was deflated and removed.  Short-leg splint applied.  Anesthesia reversed.  Brought to the recovery room.  Tolerated the surgery well.  No complications.     Sir Mallis F. Ayodeji Keimig, M.D.     DFM/MEDQ  D:  06/19/2014  T:  06/19/2014  Job:  [id number] 

## 2014-06-19 NOTE — Progress Notes (Signed)
Assisted Dr. Edmond Fitzgerald with right, ankle block. Side rails up, monitors on throughout procedure. See vital signs in flow sheet. Tolerated Procedure well. 

## 2014-06-20 ENCOUNTER — Encounter (HOSPITAL_BASED_OUTPATIENT_CLINIC_OR_DEPARTMENT_OTHER): Payer: Self-pay | Admitting: Orthopedic Surgery

## 2014-09-16 ENCOUNTER — Ambulatory Visit (INDEPENDENT_AMBULATORY_CARE_PROVIDER_SITE_OTHER): Payer: Managed Care, Other (non HMO) | Admitting: Psychiatry

## 2014-09-16 ENCOUNTER — Encounter (HOSPITAL_COMMUNITY): Payer: Self-pay | Admitting: Psychiatry

## 2014-09-16 VITALS — BP 132/90 | HR 93 | Ht 64.0 in | Wt 111.0 lb

## 2014-09-16 DIAGNOSIS — G4700 Insomnia, unspecified: Secondary | ICD-10-CM | POA: Diagnosis not present

## 2014-09-16 DIAGNOSIS — F331 Major depressive disorder, recurrent, moderate: Secondary | ICD-10-CM | POA: Diagnosis not present

## 2014-09-16 MED ORDER — CITALOPRAM HYDROBROMIDE 40 MG PO TABS: 40.0000 mg | ORAL_TABLET | Freq: Every day | ORAL | Status: DC

## 2014-09-16 MED ORDER — ESZOPICLONE 2 MG PO TABS: 2.0000 mg | ORAL_TABLET | Freq: Every evening | ORAL | Status: DC | PRN

## 2014-09-16 MED ORDER — LAMOTRIGINE 100 MG PO TABS: 100.0000 mg | ORAL_TABLET | Freq: Every day | ORAL | Status: DC

## 2014-09-16 NOTE — Progress Notes (Signed)
Patient ID: Monica Jensen, female   DOB: [birth year], 49 y.o.   MRN: [id number] [facility] [id number] Progress Note  LARKEN URIAS [id number] 49 y.o.  09/16/2014 4:29 PM  Chief Complaint: overall pretty good  History of Present Illness:  Her foot is still healing. She got out of the boot 2 weeks ago. She is walking on it and it is painful. Pt is unable to run which is something she really enjoys.   Pt is going thru a lot of stressful things right now. Her house is up for sell and she bought a new house. Pt is having some anxiety about her fiance.   Depression is managable. She has a few day of feeling down but it was situational. She is having more frequent crying spells. Denies isolation, anhedonia and worthlessness. Energy is low since breaking her foot. Pt is exhausted by the end of the day. If pt falls asleep on her own and is not interrupted then she can sleep without Lunesta. She takes [place] about 4 times a weeks and likes it. Appetite and concentration are good.   Denies manic and hypomanic symptoms including periods of decreased need for sleep, increased energy, mood lability, impulsivity, FOI, and excessive spending.  Pt is taking Lamictal and Celexa daily as prescribed and denies SE.  Suicidal Ideation: No Plan Formed: No Patient has means to carry out plan: No  Homicidal Ideation: No Plan Formed: No Patient has means to carry out plan: No  Review of Systems: Psychiatric: Agitation: No Hallucination: No Depressed Mood: Yes Insomnia: Yes Hypersomnia: No Altered Concentration: No Feels Worthless: No Grandiose Ideas: No Belief In Special Powers: No New/Increased Substance Abuse: No Compulsions: No  Neurologic: Headache: Yes Seizure: No Paresthesias: No   Review of Systems  Constitutional: Negative for fever, chills and weight loss.  HENT: Negative for congestion, ear pain, nosebleeds and sore throat.   Eyes: Negative for blurred vision, double vision,  pain and redness.  Respiratory: Negative for cough, shortness of breath and wheezing.   Cardiovascular: Positive for leg swelling. Negative for chest pain and palpitations.  Gastrointestinal: Negative for heartburn, nausea, vomiting and abdominal pain.  Musculoskeletal: Positive for joint pain. Negative for back pain and neck pain.  Skin: Positive for itching. Negative for rash.  Neurological: Positive for headaches. Negative for dizziness, tingling, sensory change, seizures, loss of consciousness and weakness.  Psychiatric/Behavioral: Positive for depression. Negative for suicidal ideas, hallucinations and substance abuse. The patient is not nervous/anxious and does not have insomnia.      Past Medical Family, Social History: lives with fiance and her 13yo son. Drinks 2-3 glasses of wine 2-3 times a week. She smokes 3-4 cigarettes every few days. Has been smoking since age 56. Denies illicit drug use. Reports strong family hx of depression and anxiety and alcohol abuse.   Past Medical History  Diagnosis Date  . Thyroid disease   . Depression   . GERD (gastroesophageal reflux disease)   . Hypothyroidism   . Headache(784.0)     migraines  . Anemia   . Complication of anesthesia     Epinephrine"tachycardia"-dentist  . History of kidney stones     passed  . Concussion with brief LOC   . Memory loss   . 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 Fears memory loss. Questions  About  Recent memory, loss related to [place].   Marland Kitchen  Broken foot     Outpatient Encounter Prescriptions as of 09/16/2014  Medication Sig  . citalopram (CELEXA) 40 MG tablet Take 1 tablet (40 mg total) by mouth daily.  . eszopiclone (LUNESTA) 2 MG TABS tablet Take 1 tablet (2 mg total) by mouth at bedtime as needed for sleep. Take immediately before bedtime  . ibuprofen (ADVIL,MOTRIN) 200 MG tablet Take 600 mg by mouth every 6  (six) hours as needed.  . lamoTRIgine (LAMICTAL) 100 MG tablet Take 1 tablet (100 mg total) by mouth daily.  Marland Kitchen. levothyroxine (SYNTHROID, LEVOTHROID) 88 MCG tablet Take 88 mcg by mouth daily before breakfast.  . pantoprazole (PROTONIX) 40 MG tablet Take 40 mg by mouth daily.  . hydrocortisone (ANUSOL-HC) 2.5 % rectal cream Place 1 application rectally 2 (two) times daily. (Patient not taking: Reported on 09/16/2014)  . naproxen (NAPROSYN) 250 MG tablet Take 250 mg by mouth 3 (three) times daily with meals.  . [DISCONTINUED] hydrOXYzine (ATARAX/VISTARIL) 10 MG tablet Take 20 mg by mouth 3 (three) times daily as needed.    Past Psychiatric History/Hospitalization(s): Anxiety: yes Bipolar Disorder: No Depression: Yes Mania: No  Psychosis: No  Schizophrenia: No  Personality Disorder: No  Hospitalization for psychiatric illness: Yes  History of Electroconvulsive Shock Therapy: No  Prior Suicide Attempts: No   Physical Exam: Constitutional:  BP 132/90 mmHg  Pulse 93  Ht 5\' 4"  (1.626 m)  Wt 111 lb (50.349 kg)  BMI 19.04 kg/m2  General Appearance: alert, oriented, no acute distress  Musculoskeletal: Strength & Muscle Tone: within normal limits Gait & Station: normal Patient leans: N/A  Mental Status Examination/Evaluation: Objective: Attitude: Calm and cooperative  Appearance: Fairly Groomed, appears to be stated age  Eye Contact::  Good  Speech:  Clear and Coherent and Normal Rate  Volume:  Normal  Mood:  euthymic  Affect:  Full Range- smiling and laughing appropriately thru out appt  Thought Process:  Goal Directed, Linear and Logical  Orientation:  Full (Time, Place, and Person)  Thought Content:  Negative  Suicidal Thoughts:  No  Homicidal Thoughts:  No  Judgement:  Good  Insight:  Good  Concentration: good  Memory: Immediate-limited Recent-fair Remote-poor  Recall: fair  Language: fair  Gait and Station: normal  [employer] of Knowledge: average  Psychomotor  Activity:  Normal  Akathisia:  No  Handed:  Right   AIMS (if indicated): n/a  Assets:  Manufacturing systems engineerCommunication Skills Desire for Improvement Financial Resources/Insurance Housing Intimacy Leisure Time Physical Health Resilience Social Support MicrobiologistTalents/Skills Transportation Vocational/Educational      Medical Decision Making (Choose Three): Established Problem, Stable/Improving (1), Review of Psycho-Social Stressors (1) and Review of Medication Regimen & Side Effects (2)  Assessment: Axis I: MDD- recurrent, moderate; r/o Bipolar disorder, type II depressed mood.  Axis II: deferred Axis III:  Past Medical History  Diagnosis Date  . Thyroid disease   . Depression   . GERD (gastroesophageal reflux disease)   . Hypothyroidism   . Headache(784.0)     migraines  . Anemia   . Complication of anesthesia     Epinephrine"tachycardia"-dentist  . History of kidney stones     passed  . Concussion with brief LOC   . Memory loss   . 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 Fears memory loss. Questions  About  Recent memory, loss related [place].   . Broken foot   Axis  IV: family issues  Axis V: 60   Plan: Continue Celexa to  po qD for mood Continue Lamictal  po qd for mood issues Lunesta  po qHS prn insomnia.  -Risks and benefits, side effects and alternatives discussed with patient, pt was given an opportunity to ask questions about medication, illness, and treatment. All current psychiatric medications have been reviewed and discussed with the patient and adjusted as clinically appropriate. The patient has been provided an accurate and updated list of the medications being now prescribed.  Labs- pt will sign MR to get recent labs sent to clinic  Therapy: brief supportive therapy provided. Discussed psychosocial stressors in detail. Pt is working with Sharlette Dense for  therapy  Recent Psychological testing show deficits in long term and short term memory and immediate recall.   Pt denies SI and is at an acute low risk for suicide.Patient told to call clinic if any problems occur. Patient advised to go to ER  if she should develop SI/HI, side effects, or if symptoms worsen. Has crisis numbers to call if needed. Pt verbalized understanding.  F/up in 3 months or sooner if needed  Oletta Darter, MD 09/16/2014

## 2014-12-18 ENCOUNTER — Ambulatory Visit (INDEPENDENT_AMBULATORY_CARE_PROVIDER_SITE_OTHER): Payer: Managed Care, Other (non HMO) | Admitting: Psychiatry

## 2014-12-18 ENCOUNTER — Encounter (HOSPITAL_COMMUNITY): Payer: Self-pay | Admitting: Psychiatry

## 2014-12-18 VITALS — BP 103/61 | HR 58 | Ht 64.0 in | Wt 113.2 lb

## 2014-12-18 DIAGNOSIS — F331 Major depressive disorder, recurrent, moderate: Secondary | ICD-10-CM | POA: Diagnosis not present

## 2014-12-18 DIAGNOSIS — G4700 Insomnia, unspecified: Secondary | ICD-10-CM

## 2014-12-18 MED ORDER — CITALOPRAM HYDROBROMIDE 40 MG PO TABS: 40.0000 mg | ORAL_TABLET | Freq: Every day | ORAL | Status: DC

## 2014-12-18 MED ORDER — ESZOPICLONE 3 MG PO TABS: 3.0000 mg | ORAL_TABLET | Freq: Every evening | ORAL | Status: DC | PRN

## 2014-12-18 MED ORDER — LAMOTRIGINE 150 MG PO TABS: 150.0000 mg | ORAL_TABLET | Freq: Every day | ORAL | Status: DC

## 2014-12-18 MED ORDER — LAMOTRIGINE 25 MG PO TABS: ORAL_TABLET | ORAL | Status: DC

## 2014-12-18 NOTE — Progress Notes (Addendum)
[facility] [id number] Progress Note  Monica Jensen [id number] 49 y.o.  12/18/2014 9:19 AM  Chief Complaint: I am struggling a bit due to situational stressors  History of Present Illness:  Reports several stressors including moving into a new home and selling her old town home. She is unsure if she wants to continue her current relationship. She is not happy with her fiance. Her son is going thru puberty. Pt passed up 2 promotions at work due to low self confidence.   She went to her PCP yesterday. PCP suggested that symptoms could be due to thyroid, depression or possibility menopause.  Depression is managable.She is having daily sad mood and irritability. She is having more frequent crying spells. Denies isolation, anhedonia and worthlessness.  Pt feels hopelessness due to feeling trapped in her relationship. Energy is low.  Pt is able to fall asleep with Lunesta but wakes up after 4-5 hrs and is not able to fall back asleep. She takes Lunesta every night. Concentration is poor.   Denies manic and hypomanic symptoms including periods of decreased need for sleep, increased energy, mood lability, impulsivity, FOI, and excessive spending.  Pt is taking Lamictal and Celexa daily as prescribed and denies SE.  Pt is in weekly therapy and it helping.   Suicidal Ideation: No a few weeks ago she randomly had some passive SI without plan or intent Plan Formed: No Patient has means to carry out plan: No  Homicidal Ideation: No Plan Formed: No Patient has means to carry out plan: No  Review of Systems: Psychiatric: Agitation: Yes Hallucination: No Depressed Mood: Yes Insomnia: Yes Hypersomnia: No Altered Concentration: Yes Feels Worthless: No Grandiose Ideas: No Belief In Special Powers: No New/Increased Substance Abuse: No Compulsions: No  Neurologic: Headache: Yes- migraines Seizure: No Paresthesias: No   Review of Systems  Constitutional: Negative for fever, chills and  weight loss.  HENT: Negative for congestion, ear pain, nosebleeds and sore throat.   Eyes: Positive for blurred vision. Negative for double vision, pain and redness.  Respiratory: Negative for cough, shortness of breath and wheezing.   Cardiovascular: Negative for chest pain, palpitations and leg swelling.  Gastrointestinal: Negative for heartburn, nausea, vomiting and abdominal pain.  Musculoskeletal: Negative for back pain, joint pain and neck pain.  Skin: Negative for itching and rash.  Neurological: Positive for headaches. Negative for dizziness, tingling, sensory change, seizures, loss of consciousness and weakness.  Psychiatric/Behavioral: Positive for depression. Negative for suicidal ideas, hallucinations and substance abuse. The patient is not nervous/anxious and does not have insomnia.      Past Medical Family, Social History: lives with fiance and her 13yo son. Drinks 2-3 glasses of wine 2-3 times a week. She smokes 3-4 cigarettes every few days. Has been smoking since age 44. Denies illicit drug use. Reports strong family hx of depression and anxiety and alcohol abuse.   Past Medical History  Diagnosis Date  . Thyroid disease   . Depression   . GERD (gastroesophageal reflux disease)   . Hypothyroidism   . Headache(784.0)     migraines  . Anemia   . Complication of anesthesia     Epinephrine"tachycardia"-dentist  . History of kidney stones     passed  . Concussion with brief LOC   . Memory loss   . 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 Fears memory loss. Questions  About  Recent memory,  loss related to [place].   . Broken foot     Outpatient Encounter Prescriptions as of 12/18/2014  Medication Sig  . citalopram (CELEXA) 40 MG tablet Take 1 tablet (40 mg total) by mouth daily.  . eszopiclone (LUNESTA) 2 MG TABS tablet Take 1 tablet (2 mg total) by mouth at bedtime as needed  for sleep. Take immediately before bedtime  . hydrocortisone (ANUSOL-HC) 2.5 % rectal cream Place 1 application rectally 2 (two) times daily.  Marland Kitchen ibuprofen (ADVIL,MOTRIN) 200 MG tablet Take 600 mg by mouth every 6 (six) hours as needed.  . lamoTRIgine (LAMICTAL) 100 MG tablet Take 1 tablet (100 mg total) by mouth daily.  Marland Kitchen levothyroxine (SYNTHROID, LEVOTHROID) 88 MCG tablet Take 88 mcg by mouth daily before breakfast.  . naproxen (NAPROSYN) 250 MG tablet Take 250 mg by mouth 3 (three) times daily with meals.  . pantoprazole (PROTONIX) 40 MG tablet Take 40 mg by mouth daily.   No facility-administered encounter medications on file as of 12/18/2014.    Past Psychiatric History/Hospitalization(s): Anxiety: yes Bipolar Disorder: No Depression: Yes Mania: No  Psychosis: No  Schizophrenia: No  Personality Disorder: No  Hospitalization for psychiatric illness: Yes  History of Electroconvulsive Shock Therapy: No  Prior Suicide Attempts: No   Physical Exam: Constitutional:  BP 103/61 mmHg  Pulse 58  Ht  (1.626 m)  Wt 113 lb 3.2 oz (51.347 kg)  BMI 19.42 kg/m2  General Appearance: alert, oriented, no acute distress  Musculoskeletal: Strength & Muscle Tone: within normal limits Gait & Station: normal Patient leans: N/A  Mental Status Examination/Evaluation: Objective: Attitude: Calm and cooperative  Appearance: Fairly Groomed, appears to be stated age  Eye Contact::  Good  Speech:  Clear and Coherent and Normal Rate  Volume:  Normal  Mood: depressed  Affect:  Congruent  Thought Process:  Goal Directed, Linear and Logical  Orientation:  Full (Time, Place, and Person)  Thought Content:  Negative  Suicidal Thoughts:  No  Homicidal Thoughts:  No  Judgement:  Good  Insight:  Good  Concentration: good  Memory: Immediate-poor Recent-fair Remote-poor  Recall: fair  Language: fair  Gait and Station: normal  [employer] of Knowledge: average  Psychomotor Activity:  Normal   Akathisia:  No  Handed:  Right   AIMS (if indicated): n/a  Assets:  Manufacturing systems engineer Desire for Improvement Financial Resources/Insurance Housing Intimacy Leisure Time Physical Health Resilience Social Support Microbiologist Decision Making (Choose Three): Review of Psycho-Social Stressors (1), Review or order clinical lab tests (1), Established Problem, Worsening (2), Review of Medication Regimen & Side Effects (2) and Review of New Medication or Change in Dosage (2)  Assessment: Axis I: MDD- recurrent, moderate; r/o Bipolar disorder type II- depressed mood.  Axis II: deferred    Plan: Continue Celexa   po qD for mood increase Lamictal to  po qd for mood issues. Pt has  tabs so will prescribe 14 day supply of  so pt will take  for 14 days then increase to  qD Increase Lunesta  po qHS prn insomnia.  -Risks and benefits, side effects and alternatives discussed with patient, pt was given an opportunity to ask questions about medication, illness, and treatment. All current psychiatric medications have been reviewed and discussed with the patient and adjusted as clinically appropriate. The patient has been provided an accurate and updated list of the medications being now prescribed.  Labs- reviewed labs from PCP done on  08/30/2013 TSH and T4 WNL. Will scan into chart.   Therapy: brief supportive therapy provided. Discussed psychosocial stressors in detail. Pt is working with Sharlette Dense for therapy  Recent Psychological testing show deficits in long term and short term memory and immediate recall.   Pt denies SI and is at an acute low risk for suicide.Patient told to call clinic if any problems occur. Patient advised to go to ER  if she should develop SI/HI, side effects, or if symptoms worsen. Has crisis numbers to call if needed. Pt verbalized understanding.  F/up in 3 months or sooner if  needed with Dr. Rutherford Limerick and 5 months with Dr. Ronna Polio, MD 12/18/2014

## 2014-12-22 ENCOUNTER — Telehealth (HOSPITAL_COMMUNITY): Payer: Self-pay

## 2014-12-22 NOTE — Telephone Encounter (Signed)
Telephone call with Olegario Messier, pharmacist at [facility] to verify Dr. Lemar Lofty recent new Lamictal orders as pharmacist was confused regarding increase to 125mg  daily but only given 14 pills of 25mg .  Informed per Dr. Lemar Lofty note from 12/18/14 evaluation patient has 100mg  Lamictal tablets at home and will add one 25mg  tablet to take daily with her 100mg  tablet at home for a total of 125mg  for 14 days and then will fill 150mg  order e-scribed in to go up to 150mg  total after 2 weeks at a total of 125mg .  Pharmacist agreed to make this clear for patient's instructions and will have patient call back if any problems or concerns.

## 2015-02-02 ENCOUNTER — Telehealth (HOSPITAL_COMMUNITY): Payer: Self-pay

## 2015-02-02 NOTE — Telephone Encounter (Signed)
Telephone message left for patient to follow up after she had left a message requesting a call back to go over recent medication changes.  Informed patient to call this nurse back when free to discuss any questions she has and also informed Dr. Drucilla Chalet is helping cover questions while Dr. Michae Kava is out on maternity leave. Will await patient call back to follow up on any questions or concerns.

## 2015-02-04 ENCOUNTER — Telehealth (HOSPITAL_COMMUNITY): Payer: Self-pay

## 2015-02-04 NOTE — Telephone Encounter (Signed)
Telephone call with patient stating problems with nausea and headaches almost daily since increased to lamictal 113m a day.  Patient stated she did not necessarily want to decrease but did admit symptoms are daily.  Met with Dr. TSalem Senatewho requested patient stop all Lamictal for 24 hours and then decreased her medication back to Lamictal 1030ma day.  Informed patient of change as she agreed and will see Dr. TaSalem Senatecovering for Dr. AgDoyne Keeln 03/23/15.  Patient agreed to call back for a sooner appointment if any more problems prior to that date.

## 2015-03-23 ENCOUNTER — Ambulatory Visit (HOSPITAL_COMMUNITY): Payer: Self-pay | Admitting: Psychiatry

## 2015-03-31 ENCOUNTER — Ambulatory Visit (INDEPENDENT_AMBULATORY_CARE_PROVIDER_SITE_OTHER): Payer: Managed Care, Other (non HMO) | Admitting: Psychiatry

## 2015-03-31 ENCOUNTER — Encounter (HOSPITAL_COMMUNITY): Payer: Self-pay | Admitting: Psychiatry

## 2015-03-31 VITALS — BP 128/82 | HR 75 | Ht 64.0 in | Wt 111.6 lb

## 2015-03-31 DIAGNOSIS — G4700 Insomnia, unspecified: Secondary | ICD-10-CM

## 2015-03-31 DIAGNOSIS — F331 Major depressive disorder, recurrent, moderate: Secondary | ICD-10-CM | POA: Diagnosis not present

## 2015-03-31 MED ORDER — CITALOPRAM HYDROBROMIDE 40 MG PO TABS: 40.0000 mg | ORAL_TABLET | Freq: Every day | ORAL | Status: DC

## 2015-03-31 MED ORDER — LAMOTRIGINE 100 MG PO TABS
100.0000 mg | ORAL_TABLET | Freq: Every day | ORAL | Status: DC
Start: 2015-03-31 — End: 2015-06-25

## 2015-03-31 MED ORDER — ESZOPICLONE 2 MG PO TABS: 2.0000 mg | ORAL_TABLET | Freq: Every evening | ORAL | Status: DC | PRN

## 2015-03-31 NOTE — Progress Notes (Signed)
Patient ID: Monica Jensen, female   DOB: [birth year], 49 y.o.   MRN: [id number] [facility] [id number] Progress Note  Monica Jensen [id number] 49 y.o.  03/31/2015 8:45 AM  Chief Complaint: "I am struggling a bit due to situational stressors"  History of Present Illness:  Pt reports OB has confirmed pt is now in menopause. She is having extreme fatigue and OB states it is a symptom on menopause.   Pt took  of Lamictal for about 6 weeks and it caused nausea and headaches. Pt stopped and went down to  and symptoms resolved.   Reports several ongoing stressors and she is in counseling. She goes for therapy every 2 weeks. Pt is planning on ending her relationship and is moving out soon. Since her discussion with him about this states her boyfriend has been acting better. Pt has not made any active steps towards moving out.   Depression is managable. Pt is having a lot of situational crying. She is having daily sad mood and irritability that is improved as compared to several months ago States she feels useless due to her fatigue and is affecting her work and personal life.  Denies isolation, anhedonia and hopelessness. Energy is low.  Concentration is poor.   Pt is able to fall asleep with Lunesta  but wakes up after 4-5 hrs and is not able to fall back asleep. She takes Lunesta every night. States she has to wake up to use the bathroom. She was unable to tolerate the  due to next Tarence Searcy fatigue.   Denies manic and hypomanic symptoms including periods of decreased need for sleep, increased energy, mood lability, impulsivity, FOI, and excessive spending.  Pt is taking Lamictal, Lunesta and Celexa daily as prescribed and denies SE.  Suicidal Ideation: No a few weeks ago she randomly had some passive SI without plan or intent Plan Formed: No Patient has means to carry out plan: No  Homicidal Ideation: No Plan Formed: No Patient has means to carry out plan: No  Review of  Systems: Psychiatric: Agitation: Yes Hallucination: No Depressed Mood: Yes Insomnia: Yes Hypersomnia: No Altered Concentration: Yes Feels Worthless: No Grandiose Ideas: No Belief In Special Powers: No New/Increased Substance Abuse: No Compulsions: No  Neurologic: Headache: Yes- migraines Seizure: No Paresthesias: No    Review of Systems  Constitutional: Negative for fever, chills and weight loss.  HENT: Negative for congestion, ear pain, nosebleeds and sore throat.   Eyes: Positive for blurred vision. Negative for double vision, pain and redness.  Respiratory: Negative for cough, shortness of breath and wheezing.   Cardiovascular: Negative for chest pain, palpitations and leg swelling.  Gastrointestinal: Positive for heartburn, abdominal pain, diarrhea and blood in stool. Negative for nausea and vomiting.  Musculoskeletal: Negative for back pain, joint pain and neck pain.  Skin: Negative for itching and rash.  Neurological: Positive for headaches. Negative for dizziness, tingling, sensory change, seizures, loss of consciousness and weakness.  Psychiatric/Behavioral: Positive for depression and memory loss. Negative for suicidal ideas, hallucinations and substance abuse. The patient has insomnia. The patient is not nervous/anxious.      Past Medical Family, Social History: lives with fiance and her 13yo son. Drinks 2-3 glasses of wine 2-3 times a week. She smokes 3-4 cigarettes every few days. Has been smoking since age 89. Denies illicit drug use. Reports strong family hx of depression and anxiety and alcohol abuse.   Past Medical History  Diagnosis Date  . Thyroid disease   .  Depression   . GERD (gastroesophageal reflux disease)   . Hypothyroidism   . Headache(784.0)     migraines  . Anemia   . Complication of anesthesia     Epinephrine"tachycardia"-dentist  . History of kidney stones     passed  . Concussion with brief LOC   . Memory loss   . Hypersomnolence  disorder, subacute 09/09/2013    Sleepiness has increased over the last 6 month, she has been using Ambien for 20 years. Fears memory loss. Questions  About  Recent memory, loss related to [place].   . Broken foot     Outpatient Encounter Prescriptions as of 03/31/2015  Medication Sig  . citalopram (CELEXA) 40 MG tablet Take 1 tablet (40 mg total) by mouth daily.  . Eszopiclone 3 MG TABS Take 1 tablet (3 mg total) by mouth at bedtime as needed for sleep. Take immediately before bedtime (Patient taking differently: Take 2 mg by mouth at bedtime as needed for sleep. Take immediately before bedtime)  . hydrocortisone (ANUSOL-HC) 2.5 % rectal cream Place 1 application rectally 2 (two) times daily.  Marland Kitchen ibuprofen (ADVIL,MOTRIN) 200 MG tablet Take 600 mg by mouth every 6 (six) hours as needed.  . lamoTRIgine (LAMICTAL) 150 MG tablet Take 1 tablet (150 mg total) by mouth daily. (Patient taking differently: Take 100 mg by mouth daily. )  . levothyroxine (SYNTHROID, LEVOTHROID) 88 MCG tablet Take 88 mcg by mouth daily before breakfast.  . norethindrone-ethinyl estradiol (FYAVOLV) 1-5 MG-MCG TABS Take 1 tablet by mouth daily.  . pantoprazole (PROTONIX) 40 MG tablet Take 40 mg by mouth daily.  Marland Kitchen lamoTRIgine (LAMICTAL) 25 MG tablet Take  qD for 14 days then increase to . (Patient not taking: Reported on 03/31/2015)  . naproxen (NAPROSYN) 250 MG tablet Take 250 mg by mouth 3 (three) times daily with meals.   No facility-administered encounter medications on file as of 03/31/2015.    Past Psychiatric History/Hospitalization(s): Anxiety: yes Bipolar Disorder: No Depression: Yes Mania: No  Psychosis: No  Schizophrenia: No  Personality Disorder: No  Hospitalization for psychiatric illness: Yes  History of Electroconvulsive Shock Therapy: No  Prior Suicide Attempts: No   Physical Exam: Constitutional:  BP 128/82 mmHg  Pulse 75  Ht  (1.626 m)  Wt 111 lb 9.6 oz (50.621 kg)  BMI 19.15  kg/m2  General Appearance: alert, oriented, no acute distress  Musculoskeletal: Strength & Muscle Tone: within normal limits Gait & Station: normal Patient leans: N/A  Mental Status Examination/Evaluation: Objective: Attitude: Calm and cooperative  Appearance: Fairly Groomed, appears to be stated age  Eye Contact::  Good  Speech:  Clear and Coherent and Normal Rate  Volume:  Normal  Mood: depressed  Affect:  Congruent  Thought Process:  Goal Directed, Linear and Logical  Orientation:  Full (Time, Place, and Person)  Thought Content:  Negative  Suicidal Thoughts:  No  Homicidal Thoughts:  No  Judgement:  Good  Insight:  Good  Concentration: good  Memory: Immediate-poor Recent-fair Remote-poor  Recall: fair  Language: fair  Gait and Station: normal  [employer] of Knowledge: average  Psychomotor Activity:  Normal  Akathisia:  No  Handed:  Right   AIMS (if indicated): n/a  Assets:  Communication Skills Desire for Improvement Financial Resources/Insurance Housing Intimacy Leisure Time Physical Health Resilience Social Support Microbiologist Decision Making (Choose Three): Review of Psycho-Social Stressors (1), Review or order clinical lab tests (1), Established Problem, Worsening (  2), Review of Medication Regimen & Side Effects (2) and Review of New Medication or Change in Dosage (2)  Assessment: Axis I: MDD- recurrent, moderate; r/o Bipolar disorder type II- depressed mood.  Axis II: deferred    Plan: 1.Continue Celexa   po qD for mood 2.continue Lamictal  po qd for mood issues. Fyavolv can cause decrease in Lamictal effectiveness. Pt may need dose increase if depression symptoms worsen.  3.Decrease Lunesta  po qHS prn insomnia.  -Risks and benefits, side effects and alternatives discussed with patient, pt was given an opportunity to ask questions about medication, illness, and treatment. All  current psychiatric medications have been reviewed and discussed with the patient and adjusted as clinically appropriate. The patient has been provided an accurate and updated list of the medications being now prescribed.  Labs- reviewed labs from PCP done on 08/30/2013 TSH and T4 WNL.Pt will bring in copy of recent labs from PCP at next visit.   Therapy: brief supportive therapy provided. Discussed psychosocial stressors in detail. Pt is working with Sharlette Dense for therapy  Recent Psychological testing show deficits in long term and short term memory and immediate recall.   Pt denies SI and is at an acute low risk for suicide.Patient told to call clinic if any problems occur. Patient advised to go to ER  if she should develop SI/HI, side effects, or if symptoms worsen. Has crisis numbers to call if needed. Pt verbalized understanding.  F/up in 2 months or sooner if needed  Oletta Darter, MD 03/31/2015

## 2015-05-21 ENCOUNTER — Encounter (HOSPITAL_COMMUNITY): Payer: Self-pay | Admitting: Psychiatry

## 2015-05-21 ENCOUNTER — Ambulatory Visit (INDEPENDENT_AMBULATORY_CARE_PROVIDER_SITE_OTHER): Payer: Managed Care, Other (non HMO) | Admitting: Psychiatry

## 2015-05-21 VITALS — BP 107/70 | HR 76 | Ht 64.0 in | Wt 112.4 lb

## 2015-05-21 DIAGNOSIS — F331 Major depressive disorder, recurrent, moderate: Secondary | ICD-10-CM

## 2015-05-21 NOTE — Progress Notes (Signed)
[facility] [id number] Progress Note  Monica BurkeBrenda W Jensen [id number] 49 y.o.  05/21/2015 8:56 AM  Chief Complaint: "doing ok but not as great as last visit"  History of Present Illness:  Pt reports OB has confirmed pt is now in menopause. She is having extreme fatigue and OB states it is a symptom on menopause. Pt is wondering if Lamictal is less effective due to HRT or if is related to seasonal changes.   Reports several ongoing stressors and she is in counseling. Pt had a talk with her fiance and told him that she wanted to end the relationship. Things changed and have been better.   For the last one month she is has been very flat. Her fiance described it as she looked as someone has died.   Depression is present and more situational. Pt is having a lot of situational crying. She is having daily sad mood and irritability. States she feels useless due to her fatigue and is affecting her work and personal life.  Reports some anhedonia and notes it gets this way every winter. Anniversary of her father's death is tomorrow and that is always a hard time of the year. Denies isolation and hopelessness. Energy is low but it is improving some with HRT.  Concentration is poor.   Pt is able to fall asleep with Lunesta 2mg  but wakes up after 4-5 hrs and is not able to fall back asleep. She takes Lunesta every night. States she has to wake up to use the bathroom. She was unable to tolerate the 3mg  due to next day fatigue.   Denies manic and hypomanic symptoms including periods of decreased need for sleep, increased energy, mood lability, impulsivity, FOI, and excessive spending.  Pt is taking Lamictal, Lunesta and Celexa daily as prescribed and denies SE.  Suicidal Ideation: No  Plan Formed: No Patient has means to carry out plan: No  Homicidal Ideation: No Plan Formed: No Patient has means to carry out plan: No  Review of Systems: Psychiatric: Agitation: Yes Hallucination: No Depressed  Mood: Yes Insomnia: Yes Hypersomnia: No Altered Concentration: Yes Feels Worthless: No Grandiose Ideas: No Belief In Special Powers: No New/Increased Substance Abuse: No Compulsions: No  Neurologic: Headache: No- migraines Seizure: No Paresthesias: No    Review of Systems  Constitutional: Positive for malaise/fatigue. Negative for fever, chills and weight loss.  HENT: Negative for congestion, ear pain, nosebleeds and sore throat.   Eyes: Positive for blurred vision. Negative for double vision, pain and redness.  Respiratory: Negative for cough, shortness of breath and wheezing.   Cardiovascular: Negative for chest pain, palpitations and leg swelling.  Gastrointestinal: Positive for heartburn, abdominal pain, diarrhea and blood in stool. Negative for nausea and vomiting.  Musculoskeletal: Negative for back pain, joint pain and neck pain.  Skin: Negative for itching and rash.  Neurological: Negative for dizziness, tingling, sensory change, seizures, loss of consciousness, weakness and headaches.  Psychiatric/Behavioral: Positive for depression and memory loss. Negative for suicidal ideas, hallucinations and substance abuse. The patient has insomnia. The patient is not nervous/anxious.      Past Medical Family, Social History: lives with fiance and her 13yo son. Drinks 2-3 glasses of wine 2-3 times a week. She smokes 3-4 cigarettes every few days. Has been smoking since age 49. Denies illicit drug use. Reports strong family hx of depression and anxiety and alcohol abuse.   Past Medical History  Diagnosis Date  . Thyroid disease   . Depression   .  GERD (gastroesophageal reflux disease)   . Hypothyroidism   . Headache(784.0)     migraines  . Anemia   . Complication of anesthesia     Epinephrine"tachycardia"-dentist  . History of kidney stones     passed  . Concussion with brief LOC   . Memory loss   . Hypersomnolence disorder, subacute 09/09/2013    Sleepiness has increased  over the last 6 month, she has been using Ambien for 20 years. Fears memory loss. Questions  About  Recent memory, loss related to [place].   . Broken foot   . Menopause     Outpatient Encounter Prescriptions as of 05/21/2015  Medication Sig  . citalopram (CELEXA) 40 MG tablet Take 1 tablet (40 mg total) by mouth daily.  . eszopiclone (LUNESTA) 2 MG TABS tablet Take 1 tablet (2 mg total) by mouth at bedtime as needed for sleep. Take immediately before bedtime  . hydrocortisone (ANUSOL-HC) 2.5 % rectal cream Place 1 application rectally 2 (two) times daily.  Marland Kitchen ibuprofen (ADVIL,MOTRIN) 200 MG tablet Take 600 mg by mouth every 6 (six) hours as needed.  . lamoTRIgine (LAMICTAL) 100 MG tablet Take 1 tablet (100 mg total) by mouth daily.  Marland Kitchen levothyroxine (SYNTHROID, LEVOTHROID) 88 MCG tablet Take 88 mcg by mouth daily before breakfast.  . naproxen (NAPROSYN) 250 MG tablet Take 250 mg by mouth 3 (three) times daily with meals.  . norethindrone-ethinyl estradiol (FYAVOLV) 1-5 MG-MCG TABS Take 1 tablet by mouth daily.  . pantoprazole (PROTONIX) 40 MG tablet Take 40 mg by mouth daily.   No facility-administered encounter medications on file as of 05/21/2015.    Past Psychiatric History/Hospitalization(s): Anxiety: yes Bipolar Disorder: No Depression: Yes Mania: No  Psychosis: No  Schizophrenia: No  Personality Disorder: No  Hospitalization for psychiatric illness: Yes  History of Electroconvulsive Shock Therapy: No  Prior Suicide Attempts: No   Physical Exam: Constitutional:  BP 107/70 mmHg  Pulse 76  Ht  (1.626 m)  Wt 112 lb 6.4 oz (50.984 kg)  BMI 19.28 kg/m2  General Appearance: alert, oriented, no acute distress  Musculoskeletal: Strength & Muscle Tone: within normal limits Gait & Station: normal Patient leans: N/A  Mental Status Examination/Evaluation: Objective: Attitude: Calm and cooperative  Appearance: Fairly Groomed, appears to be stated age  Eye Contact::  Good   Speech:  Clear and Coherent and Normal Rate  Volume:  Normal  Mood: depressed  Affect:  Congruent  Thought Process:  Goal Directed, Linear and Logical  Orientation:  Full (Time, Place, and Person)  Thought Content:  Negative  Suicidal Thoughts:  No  Homicidal Thoughts:  No  Judgement:  Good  Insight:  Good  Concentration: good  Memory: Immediate-poor Recent-fair Remote-poor  Recall: fair  Language: fair  Gait and Station: normal  [employer] of Knowledge: average  Psychomotor Activity:  Normal  Akathisia:  No  Handed:  Right   AIMS (if indicated): n/a  Assets:  Manufacturing systems engineer Desire for Improvement Financial Resources/Insurance Housing Intimacy Leisure Time Physical Health Resilience Social Support Microbiologist Decision Making (Choose Three): Review of Psycho-Social Stressors (1), Established Problem, Worsening (2), Review of Medication Regimen & Side Effects (2) and Review of New Medication or Change in Dosage (2)  Assessment: Axis I: MDD- recurrent, moderate; r/o Bipolar disorder type II- depressed mood.  Axis II: deferred    Plan: 1.Continue Celexa   po qD for mood 2.increase Lamictal to  po qd for  mood issues. Fyavolv can cause decrease in Lamictal effectiveness. Pt may need dose increase if depression symptoms worsen.  3. Lunesta  po qHS prn insomnia.  -Risks and benefits, side effects and alternatives discussed with patient, pt was given an opportunity to ask questions about medication, illness, and treatment. All current psychiatric medications have been reviewed and discussed with the patient and adjusted as clinically appropriate. The patient has been provided an accurate and updated list of the medications being now prescribed.  Labs- reviewed labs from PCP done on 08/30/2013 TSH and T4 WNL.Pt will bring in copy of recent labs from PCP at next visit.   Therapy: brief supportive  therapy provided. Discussed psychosocial stressors in detail. Pt is working with Sharlette Dense for therapy  Recent Psychological testing show deficits in long term and short term memory and immediate recall.   Pt denies SI and is at an acute low risk for suicide.Patient told to call clinic if any problems occur. Patient advised to go to ER  if she should develop SI/HI, side effects, or if symptoms worsen. Has crisis numbers to call if needed. Pt verbalized understanding.  F/up in 1 months or sooner if needed  Oletta Darter, MD 05/21/2015

## 2015-05-26 ENCOUNTER — Telehealth (HOSPITAL_COMMUNITY): Payer: Self-pay

## 2015-05-26 DIAGNOSIS — F331 Major depressive disorder, recurrent, moderate: Secondary | ICD-10-CM

## 2015-05-26 MED ORDER — LAMOTRIGINE 25 MG PO TABS: 25.0000 mg | ORAL_TABLET | Freq: Every day | ORAL | Status: DC

## 2015-05-26 NOTE — Addendum Note (Signed)
Addended by: Wilder GladeAYLOR, Ashelynn Marks V on: 05/26/2015 05:26 PM   Modules accepted: Orders

## 2015-05-26 NOTE — Telephone Encounter (Signed)
Medication management - Patient left a message her Lamictal was increased to 125 mg a day at evaluation 05/21/15 but she has not received a new 25 mg Lamictal order at her pharmacy and is requesting Dr. Michae KavaAgarwal send the new order.

## 2015-05-26 NOTE — Telephone Encounter (Addendum)
Met with Dr. Doyne Keel who approved a 90 day order for patient's Lamictal 25 mg, one a day be sent to patient's [place] and for patient to take a total of 125 mg daily of Lamictal, also taking Lamictal 100 mg daily.  Called patient and left a message this order was being sent in for her to [facility].  A new Lamictal 25 mg, one a day, #90 order e-scribed to patient's [facility] as authorized by Dr. Doyne Keel.

## 2015-06-02 ENCOUNTER — Ambulatory Visit (HOSPITAL_COMMUNITY): Payer: Self-pay | Admitting: Psychiatry

## 2015-06-25 ENCOUNTER — Encounter (HOSPITAL_COMMUNITY): Payer: Self-pay | Admitting: Psychiatry

## 2015-06-25 ENCOUNTER — Ambulatory Visit (INDEPENDENT_AMBULATORY_CARE_PROVIDER_SITE_OTHER): Payer: Managed Care, Other (non HMO) | Admitting: Psychiatry

## 2015-06-25 ENCOUNTER — Ambulatory Visit (HOSPITAL_COMMUNITY): Payer: Self-pay | Admitting: Psychiatry

## 2015-06-25 VITALS — BP 127/86 | HR 85 | Ht 64.0 in | Wt 112.6 lb

## 2015-06-25 DIAGNOSIS — F331 Major depressive disorder, recurrent, moderate: Secondary | ICD-10-CM

## 2015-06-25 DIAGNOSIS — G4700 Insomnia, unspecified: Secondary | ICD-10-CM | POA: Diagnosis not present

## 2015-06-25 MED ORDER — CITALOPRAM HYDROBROMIDE 40 MG PO TABS: 40.0000 mg | ORAL_TABLET | Freq: Every day | ORAL | Status: DC

## 2015-06-25 MED ORDER — ESZOPICLONE 3 MG PO TABS: 3.0000 mg | ORAL_TABLET | Freq: Every evening | ORAL | Status: DC | PRN

## 2015-06-25 MED ORDER — LAMOTRIGINE 25 MG PO TABS: ORAL_TABLET | ORAL | Status: DC

## 2015-06-25 MED ORDER — LAMOTRIGINE 100 MG PO TABS: ORAL_TABLET | ORAL | Status: DC

## 2015-06-25 NOTE — Progress Notes (Signed)
Patient ID: Monica BurkeBrenda W Geissinger, female   DOB: Nov 19, 1965, 49 y.o.   MRN: [id number] [facility] [id number] Progress Note  Monica Jensen [id number] 49 y.o.  06/25/2015 9:54 AM  Chief Complaint: "mood is more stable"  History of Present Illness:  The increased dose of Lamictal is helping to improve her mood. Pt feels more stable and more like her old self. Pt has 1-2 bad days a week.   Reports several ongoing stressors and she is in counseling. Pt had a talk with her fiance and told him that she wanted to end the relationship. It is a roller coaster.  For the last one month she is has been very flat. Her fiance described it as she looked as someone has died.   Depression is present and more situational. Pt is having a lot of situational crying. She is having less frequent sad mood and irritability. Low energy is variable. She is more productive.  Reports some anhedonia and notes it getting better. Denies isolation and hopelessness.  Concentration is improving.   Pt is able to fall asleep with Lunesta 2mg  but wakes up after 4-5 hrs and is not able to fall back asleep. She takes Lunesta every night. She sometimes takes 3mg  and states it no longer causes next day fatigue.    Denies manic and hypomanic symptoms including periods of decreased need for sleep, increased energy, mood lability, impulsivity, FOI, and excessive spending.  Pt is taking Lamictal, Lunesta and Celexa daily as prescribed and denies SE.  Suicidal Ideation: No  Plan Formed: No Patient has means to carry out plan: No  Homicidal Ideation: No Plan Formed: No Patient has means to carry out plan: No  Review of Systems: Psychiatric: Agitation: Yes Hallucination: No Depressed Mood: Yes Insomnia: Yes Hypersomnia: No Altered Concentration: Yes Feels Worthless: No Grandiose Ideas: No Belief In Special Powers: No New/Increased Substance Abuse: No Compulsions: No  Neurologic: Headache: No- migraines Seizure:  No Paresthesias: No    Review of Systems  Constitutional: Negative for fever, chills, weight loss and malaise/fatigue.  HENT: Negative for congestion, ear pain, nosebleeds and sore throat.   Eyes: Positive for blurred vision. Negative for double vision, pain and redness.  Respiratory: Negative for cough, shortness of breath and wheezing.   Cardiovascular: Negative for chest pain, palpitations and leg swelling.  Gastrointestinal: Positive for heartburn, abdominal pain, diarrhea and blood in stool. Negative for nausea and vomiting.  Musculoskeletal: Negative for back pain, joint pain and neck pain.  Skin: Negative for itching and rash.  Neurological: Negative for dizziness, tingling, sensory change, seizures, loss of consciousness, weakness and headaches.  Psychiatric/Behavioral: Positive for depression and memory loss. Negative for suicidal ideas, hallucinations and substance abuse. The patient is not nervous/anxious and does not have insomnia.      Past Medical Family, Social History: lives with fiance and her 13yo son. Drinks 2-3 glasses of wine 2-3 times a week. She smokes 3-4 cigarettes every few days. Has been smoking since age [age]. Denies illicit drug use. Reports strong family hx of depression and anxiety and alcohol abuse.   Past Medical History  Diagnosis Date  . Thyroid disease   . Depression   . GERD (gastroesophageal reflux disease)   . Hypothyroidism   . Headache(784.0)     migraines  . Anemia   . Complication of anesthesia     Epinephrine"tachycardia"-dentist  . History of kidney stones     passed  . Concussion with brief LOC   .  Memory loss   . 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 Fears memory loss. Questions  About  Recent memory, loss related to [place].   . Broken foot   . Menopause   . Ovarian cyst     Outpatient Encounter Prescriptions as of 06/25/2015   Medication Sig  . citalopram (CELEXA) 40 MG tablet Take 1 tablet (40 mg total) by mouth daily.  . eszopiclone (LUNESTA) 2 MG TABS tablet Take 1 tablet (2 mg total) by mouth at bedtime as needed for sleep. Take immediately before bedtime  . hydrocortisone (ANUSOL-HC) 2.5 % rectal cream Place 1 application rectally 2 (two) times daily.  Marland Kitchen ibuprofen (ADVIL,MOTRIN) 200 MG tablet Take 600 mg by mouth every 6 (six) hours as needed.  . lamoTRIgine (LAMICTAL) 100 MG tablet Take 1 tablet (100 mg total) by mouth daily.  Marland Kitchen lamoTRIgine (LAMICTAL) 25 MG tablet Take 1 tablet (25 mg total) by mouth daily.  Marland Kitchen levothyroxine (SYNTHROID, LEVOTHROID) 88 MCG tablet Take 88 mcg by mouth daily before breakfast.  . norethindrone-ethinyl estradiol (FYAVOLV) 1-5 MG-MCG TABS Take 1 tablet by mouth daily.  . pantoprazole (PROTONIX) 40 MG tablet Take 40 mg by mouth daily.  . naproxen (NAPROSYN) 250 MG tablet Take 250 mg by mouth 3 (three) times daily with meals. Reported on 06/25/2015   No facility-administered encounter medications on file as of 06/25/2015.    Past Psychiatric History/Hospitalization(s): Anxiety: yes Bipolar Disorder: No Depression: Yes Mania: No  Psychosis: No  Schizophrenia: No  Personality Disorder: No  Hospitalization for psychiatric illness: Yes  History of Electroconvulsive Shock Therapy: No  Prior Suicide Attempts: No   Physical Exam: Constitutional:  BP 127/86 mmHg  Pulse 85  Ht  (1.626 m)  Wt 112 lb 9.6 oz (51.075 kg)  BMI 19.32 kg/m2  General Appearance: alert, oriented, no acute distress  Musculoskeletal: Strength & Muscle Tone: within normal limits Gait & Station: normal Patient leans: N/A  Mental Status Examination/Evaluation: Objective: Attitude: Calm and cooperative  Appearance: Fairly Groomed, appears to be stated age  Eye Contact::  Good  Speech:  Clear and Coherent and Normal Rate  Volume:  Normal  Mood: depressed-mild  Affect:  Congruent  Thought  Process:  Goal Directed, Linear and Logical  Orientation:  Full (Time, Place, and Person)  Thought Content:  Negative  Suicidal Thoughts:  No  Homicidal Thoughts:  No  Judgement:  Good  Insight:  Good  Concentration: good  Memory: Immediate-poor Recent-fair Remote-poor  Recall: fair  Language: fair  Gait and Station: normal  [employer] of Knowledge: average  Psychomotor Activity:  Normal  Akathisia:  No  Handed:  Right   AIMS (if indicated): n/a  Assets:  Manufacturing systems engineer Desire for Improvement Financial Resources/Insurance Housing Intimacy Leisure Time Physical Health Resilience Social Support Microbiologist Decision Making (Choose Three): Established Problem, Stable/Improving (1), Review of Psycho-Social Stressors (1), Review or order clinical lab tests (1), Established Problem, Worsening (2), Review of Medication Regimen & Side Effects (2) and Review of New Medication or Change in Dosage (2)  Assessment: Axis I: MDD- recurrent, moderate; r/o Bipolar disorder type II- depressed mood, Insomnia Axis II: deferred    Plan: 1.Continue Celexa   po qD for mood 2. Lamictal  po qd for mood issues. Fyavolv can cause decrease in Lamictal effectiveness. Pt may need dose increase if depression symptoms  worsen.  3. increase Lunesta  po qHS prn insomnia.  -Risks and benefits, side effects and alternatives discussed with patient, pt was given an opportunity to ask questions about medication, illness, and treatment. All current psychiatric medications have been reviewed and discussed with the patient and adjusted as clinically appropriate. The patient has been provided an accurate and updated list of the medications being now prescribed.  Labs- reviewed labs from PCP done on 12/16/2014 CBC WNL, CMP WNL, TSH WNL Therapy: brief supportive therapy provided. Discussed psychosocial stressors in detail. Pt is working  with Sharlette Dense for therapy  Recent Psychological testing show deficits in long term and short term memory and immediate recall.   Pt denies SI and is at an acute low risk for suicide.Patient told to call clinic if any problems occur. Patient advised to go to ER  if she should develop SI/HI, side effects, or if symptoms worsen. Has crisis numbers to call if needed. Pt verbalized understanding.  F/up in 3 months or sooner if needed  Oletta Darter, MD 06/25/2015

## 2015-09-01 ENCOUNTER — Encounter (HOSPITAL_COMMUNITY): Payer: Self-pay | Admitting: Psychiatry

## 2015-09-01 ENCOUNTER — Ambulatory Visit (INDEPENDENT_AMBULATORY_CARE_PROVIDER_SITE_OTHER): Payer: Managed Care, Other (non HMO) | Admitting: Psychiatry

## 2015-09-01 VITALS — BP 125/81 | HR 72 | Ht 64.0 in | Wt 111.0 lb

## 2015-09-01 DIAGNOSIS — F331 Major depressive disorder, recurrent, moderate: Secondary | ICD-10-CM | POA: Diagnosis not present

## 2015-09-01 DIAGNOSIS — F5081 Binge eating disorder: Secondary | ICD-10-CM | POA: Diagnosis not present

## 2015-09-01 DIAGNOSIS — R45851 Suicidal ideations: Secondary | ICD-10-CM | POA: Diagnosis not present

## 2015-09-01 DIAGNOSIS — G4700 Insomnia, unspecified: Secondary | ICD-10-CM

## 2015-09-01 MED ORDER — ESZOPICLONE 3 MG PO TABS: 3.0000 mg | ORAL_TABLET | Freq: Every evening | ORAL | Status: DC | PRN

## 2015-09-01 MED ORDER — LAMOTRIGINE 25 MG PO TABS: ORAL_TABLET | ORAL | Status: DC

## 2015-09-01 MED ORDER — LISDEXAMFETAMINE DIMESYLATE 10 MG PO CAPS: 10.0000 mg | ORAL_CAPSULE | Freq: Every day | ORAL | Status: DC

## 2015-09-01 MED ORDER — LAMOTRIGINE 100 MG PO TABS: ORAL_TABLET | ORAL | Status: DC

## 2015-09-01 MED ORDER — CITALOPRAM HYDROBROMIDE 40 MG PO TABS: 40.0000 mg | ORAL_TABLET | Freq: Every day | ORAL | Status: DC

## 2015-09-01 NOTE — Progress Notes (Signed)
[facility] [id number] Progress Note  Monica Jensen [id number] 50 y.o.  09/01/2015 10:19 AM  Chief Complaint: "I am struggling"  History of Present Illness:  States she is struggling for the last month. States her last several appt with [facility] and Ob/gyn she has been crying. Meds seems to work for a little while then stop.   States she wakes up every morning dreading the day and has been depressed and irritable. Depression is present and is constant.  Pt is crying more and and have ahedonia. Reports worthlessness and hopelessness. It is has been worse in the last 6 weeks. States the only good days she has are when she takes her son's Focalin. She takes it twice a week. Most days are a struggle otherwise. Pt reports focus is off and she has a hard time a work. Pt thinks she has Bipolar disorder or ADD.   Reports several ongoing stressors and she is in counseling. Things with her fiance remain unstable.   Pt is able to fall asleep with Lunesta 3mg  but wakes up after 4-5 hrs and is not able to fall back asleep. She takes Lunesta every night. At night she is wired and very anxious   Pt has struggled with eating disorder since teens. It is now bad again. Pt is restricting and has gone 2 days without eating. Other days she purging if she binges (1lb pasta, 1/2 carton of ice cream, large pizza in one sitting).Pt is binging 3-4x/week. She is trying to be feel numb. Therapist wants pt to see an eating disorder specialist but pt is worried about cost.   Denies manic and hypomanic symptoms including periods of decreased need for sleep, increased energy, mood lability, impulsivity, FOI, and excessive spending. On good days she is "elated".  Pt is taking Lamictal, Lunesta and Celexa daily as prescribed and denies SE.  Suicidal Ideation: Yes constant state of "I don't want to be around anymore"  Plan Formed: No Patient has means to carry out plan: No  Homicidal Ideation: No Plan Formed: No  Patient has means to carry out plan: No  Review of Systems: Psychiatric: Agitation: Yes Hallucination: No Depressed Mood: Yes Insomnia: Yes Hypersomnia: No Altered Concentration: Yes Feels Worthless: Yes Grandiose Ideas: No Belief In Special Powers: No New/Increased Substance Abuse: No Compulsions: No  Neurologic: Headache: No- migraines Seizure: No Paresthesias: No    Review of Systems  Constitutional: Negative for fever, chills, weight loss and malaise/fatigue.  HENT: Negative for congestion, ear pain, nosebleeds and sore throat.   Eyes: Negative for blurred vision, double vision, pain and redness.  Respiratory: Negative for cough, shortness of breath and wheezing.   Cardiovascular: Negative for chest pain, palpitations and leg swelling.  Gastrointestinal: Positive for heartburn, abdominal pain and diarrhea. Negative for nausea, vomiting and blood in stool.  Musculoskeletal: Positive for joint pain and neck pain. Negative for back pain.  Skin: Negative for itching and rash.  Neurological: Positive for sensory change. Negative for dizziness, tingling, seizures, loss of consciousness, weakness and headaches.  Psychiatric/Behavioral: Positive for depression, suicidal ideas and memory loss. Negative for hallucinations and substance abuse. The patient is nervous/anxious and has insomnia.      Past Medical Family, Social History: lives with fiance and her 13yo son. Drinks 2-3 glasses of wine 2-3 times a week. She smokes 3-4 cigarettes every few days. Has been smoking since age 47. Denies illicit drug use. Reports strong family hx of depression and anxiety and alcohol abuse.  Past Medical History  Diagnosis Date  . Thyroid disease   . Depression   . GERD (gastroesophageal reflux disease)   . Hypothyroidism   . Headache(784.0)     migraines  . Anemia   . Complication of anesthesia     Epinephrine"tachycardia"-dentist  . History of kidney stones     passed  . Concussion  with brief LOC   . Memory loss   . 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 Fears memory loss. Questions  About  Recent memory, loss related to [place].   . Broken foot   . Menopause   . Ovarian cyst     Outpatient Encounter Prescriptions as of 09/01/2015  Medication Sig  . citalopram (CELEXA) 40 MG tablet Take 1 tablet (40 mg total) by mouth daily.  . Eszopiclone 3 MG TABS Take 1 tablet (3 mg total) by mouth at bedtime as needed for sleep. Take immediately before bedtime  . hydrocortisone (ANUSOL-HC) 2.5 % rectal cream Place 1 application rectally 2 (two) times daily.  Marland Kitchen ibuprofen (ADVIL,MOTRIN) 200 MG tablet Take 600 mg by mouth every 6 (six) hours as needed.  . lamoTRIgine (LAMICTAL) 100 MG tablet Take  po qD  . lamoTRIgine (LAMICTAL) 25 MG tablet Take  po qD  . levothyroxine (SYNTHROID, LEVOTHROID) 88 MCG tablet Take 88 mcg by mouth daily before breakfast.  . naproxen (NAPROSYN) 250 MG tablet Take 250 mg by mouth 3 (three) times daily with meals. Reported on 06/25/2015  . norethindrone-ethinyl estradiol (FYAVOLV) 1-5 MG-MCG TABS Take 1 tablet by mouth daily.  . pantoprazole (PROTONIX) 40 MG tablet Take 40 mg by mouth daily.   No facility-administered encounter medications on file as of 09/01/2015.    Past Psychiatric History/Hospitalization(s): Anxiety: yes Bipolar Disorder: No Depression: Yes Mania: No  Psychosis: No  Schizophrenia: No  Personality Disorder: No  Hospitalization for psychiatric illness: Yes  History of Electroconvulsive Shock Therapy: No  Prior Suicide Attempts: No   Physical Exam: Constitutional:  BP 125/81 mmHg  Pulse 72  Ht  (1.626 m)  Wt 111 lb (50.349 kg)  BMI 19.04 kg/m2  General Appearance: alert, oriented, no acute distress  Musculoskeletal: Strength & Muscle Tone: within normal limits Gait & Station: normal Patient leans:  N/A  Mental Status Examination/Evaluation: Objective: Attitude: Calm and cooperative  Appearance: Fairly Groomed, appears to be stated age  Eye Contact::  Good  Speech:  Clear and Coherent and Normal Rate  Volume:  Normal  Mood: depressed and anxious  Affect:  Congruent  Thought Process:  Goal Directed, Linear and Logical  Orientation:  Full (Time, Place, and Person)  Thought Content:  Negative  Suicidal Thoughts:  No  Homicidal Thoughts:  No  Judgement:  Good  Insight:  Good  Concentration: poor  Memory: Immediate-poor Recent-fair Remote-poor  Recall: fair  Language: fair  Gait and Station: normal  [employer] of Knowledge: average  Psychomotor Activity:  Normal  Akathisia:  No  Handed:  Right   AIMS (if indicated): n/a  Assets:  Manufacturing systems engineer Desire for Improvement Financial Resources/Insurance Housing Intimacy Leisure Time Physical Health Resilience Social Support Microbiologist Decision Making (Choose Three): Established Problem, Stable/Improving (1), New problem, with additional work up planned, Review of Psycho-Social Stressors (1), Review or order clinical lab tests (1), Established Problem, Worsening (2), Review of Medication Regimen & Side Effects (2)  and Review of New Medication or Change in Dosage (2)  Assessment: Axis I: MDD- recurrent, moderate; r/o Bipolar disorder type II- depressed mood, Insomnia; Eating disorder; Anxiety state Axis II: deferred    Plan: 1.Continue Celexa   po qD for mood 2. Lamictal  po qd for mood issues. Fyavolv can cause decrease in Lamictal effectiveness. Pt may need dose increase if depression symptoms worsen.  3. Lunesta  po qHS prn insomnia.  4. Start trial of Vyvanse  po qD for BED -Risks and benefits, side effects and alternatives discussed with patient, pt was given an opportunity to ask questions about medication, illness, and treatment.  All current psychiatric medications have been reviewed and discussed with the patient and adjusted as clinically appropriate. The patient has been provided an accurate and updated list of the medications being now prescribed.  Labs- reviewed labs from PCP done on 12/16/2014 CBC WNL, CMP WNL, TSH WNL Therapy: brief supportive therapy provided. Discussed psychosocial stressors in detail. Pt is working with Sharlette Dense for therapy  Recent Psychological testing show deficits in long term and short term memory and immediate recall.   Pt denies SI and is at an acute low risk for suicide.Patient told to call clinic if any problems occur. Patient advised to go to ER  if she should develop SI/HI, side effects, or if symptoms worsen. Has crisis numbers to call if needed. Pt verbalized understanding.  F/up in 4-6 weeks or sooner if needed  Oletta Darter, MD 09/01/2015

## 2015-09-24 ENCOUNTER — Ambulatory Visit (HOSPITAL_COMMUNITY): Payer: Self-pay | Admitting: Psychiatry

## 2015-10-08 ENCOUNTER — Encounter (HOSPITAL_COMMUNITY): Payer: Self-pay | Admitting: Psychiatry

## 2015-10-08 ENCOUNTER — Ambulatory Visit (INDEPENDENT_AMBULATORY_CARE_PROVIDER_SITE_OTHER): Payer: Managed Care, Other (non HMO) | Admitting: Psychiatry

## 2015-10-08 VITALS — BP 116/78 | HR 82 | Ht 64.0 in | Wt 113.8 lb

## 2015-10-08 DIAGNOSIS — F5081 Binge eating disorder: Secondary | ICD-10-CM | POA: Diagnosis not present

## 2015-10-08 DIAGNOSIS — F331 Major depressive disorder, recurrent, moderate: Secondary | ICD-10-CM

## 2015-10-08 DIAGNOSIS — G4700 Insomnia, unspecified: Secondary | ICD-10-CM

## 2015-10-08 DIAGNOSIS — F411 Generalized anxiety disorder: Secondary | ICD-10-CM

## 2015-10-08 MED ORDER — LISDEXAMFETAMINE DIMESYLATE 10 MG PO CAPS: 10.0000 mg | ORAL_CAPSULE | Freq: Every day | ORAL | Status: DC

## 2015-10-08 MED ORDER — LISDEXAMFETAMINE DIMESYLATE 10 MG PO CAPS
10.0000 mg | ORAL_CAPSULE | ORAL | Status: DC
Start: 2015-10-08 — End: 2015-12-08

## 2015-10-08 MED ORDER — CITALOPRAM HYDROBROMIDE 40 MG PO TABS: 40.0000 mg | ORAL_TABLET | Freq: Every day | ORAL | Status: DC

## 2015-10-08 MED ORDER — LAMOTRIGINE 25 MG PO TABS: ORAL_TABLET | ORAL | Status: DC

## 2015-10-08 MED ORDER — LAMOTRIGINE 100 MG PO TABS: ORAL_TABLET | ORAL | Status: DC

## 2015-10-08 NOTE — Progress Notes (Signed)
Patient ID: Monica Jensen, female   DOB: 12/08/1965, 50 y.o.   MRN: [id number] [facility] [id number] Progress Note  Monica Jensen [id number] 50 y.o.  10/08/2015 9:05 AM  Chief Complaint: "I am better"  History of Present Illness:  Irritability is decreased since starting Vyvanse.  Reports she is more focused, more productive, more energetic with Vyvanse.   Depression is present and is better.  Notes improvement in anhedonia, worthlessness and hopelessness.   Reports several ongoing stressors and she is in counseling. Things with her fiance remain unstable.   Pt is able to fall asleep with Lunesta  but wakes up after 4-5 hrs and is not able to fall back asleep. She takes Lunesta every night.    Pt has struggled with eating disorder since teens. The days she takes Vyvanse she is not thinking about food or binging. On days she doesn't take Vyvanse she struggles. Pt restricted for several days and 2 days she had a binge eating episode.   Denies manic and hypomanic symptoms including periods of decreased need for sleep, increased energy, mood lability, impulsivity, FOI, and excessive spending. On good days she is "elated".  Pt is taking Lamictal, Vyvanse, Lunesta and Celexa daily as prescribed and denies SE.  Suicidal Ideation: No   Plan Formed: No Patient has means to carry out plan: No  Homicidal Ideation: No Plan Formed: No Patient has means to carry out plan: No  Review of Systems: Psychiatric: Agitation: Yes- improving Hallucination: No Depressed Mood: Yes Insomnia: Yes Hypersomnia: No Altered Concentration: No Feels Worthless: Yes Grandiose Ideas: No Belief In Special Powers: No New/Increased Substance Abuse: No Compulsions: No  Neurologic: Headache: No- migraines Seizure: No Paresthesias: No    Review of Systems  Constitutional: Negative for fever, chills, weight loss and malaise/fatigue.  HENT: Negative for congestion, ear pain, nosebleeds and sore  throat.   Eyes: Negative for blurred vision, double vision, pain and redness.  Respiratory: Negative for cough, shortness of breath and wheezing.   Cardiovascular: Negative for chest pain, palpitations and leg swelling.  Gastrointestinal: Positive for heartburn, abdominal pain and diarrhea. Negative for nausea, vomiting and blood in stool.  Musculoskeletal: Positive for joint pain and neck pain. Negative for back pain.  Skin: Negative for itching and rash.  Neurological: Positive for sensory change. Negative for dizziness, tingling, seizures, loss of consciousness, weakness and headaches.  Psychiatric/Behavioral: Positive for depression and memory loss. Negative for suicidal ideas, hallucinations and substance abuse. The patient is nervous/anxious and has insomnia.      Past Medical Family, Social History: lives with fiance and her 13yo son. Drinks 2-3 glasses of wine 2-3 times a week. She smokes 3-4 cigarettes every few days. Has been smoking since age 62. Denies illicit drug use. Reports strong family hx of depression and anxiety and alcohol abuse.   Past Medical History  Diagnosis Date  . Thyroid disease   . Depression   . GERD (gastroesophageal reflux disease)   . Hypothyroidism   . Headache(784.0)     migraines  . Anemia   . Complication of anesthesia     Epinephrine"tachycardia"-dentist  . History of kidney stones     passed  . Concussion with brief LOC   . Memory loss   . 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 Fears memory loss. Questions  About  Recent memory, loss related to [place].   Marland Kitchen  Broken foot   . Menopause   . Ovarian cyst     Outpatient Encounter Prescriptions as of 10/08/2015  Medication Sig  . citalopram (CELEXA) 40 MG tablet Take 1 tablet (40 mg total) by mouth daily.  . Eszopiclone 3 MG TABS Take 1 tablet (3 mg total) by mouth at bedtime as needed. Take immediately  before bedtime  . hydrocortisone (ANUSOL-HC) 2.5 % rectal cream Place 1 application rectally 2 (two) times daily.  Marland Kitchen. ibuprofen (ADVIL,MOTRIN) 200 MG tablet Take 600 mg by mouth every 6 (six) hours as needed.  . lamoTRIgine (LAMICTAL) 100 MG tablet Take 125mg  po qD  . lamoTRIgine (LAMICTAL) 25 MG tablet Take 125mg  po qD  . levothyroxine (SYNTHROID, LEVOTHROID) 88 MCG tablet Take 88 mcg by mouth daily before breakfast.  . Lisdexamfetamine Dimesylate 10 MG CAPS Take 10 mg by mouth daily.  . naproxen (NAPROSYN) 250 MG tablet Take 250 mg by mouth 3 (three) times daily with meals. Reported on 06/25/2015  . norethindrone-ethinyl estradiol (FYAVOLV) 1-5 MG-MCG TABS Take 1 tablet by mouth daily.  . pantoprazole (PROTONIX) 40 MG tablet Take 40 mg by mouth daily.   No facility-administered encounter medications on file as of 10/08/2015.    Past Psychiatric History/Hospitalization(s): Anxiety: yes Bipolar Disorder: No Depression: Yes Mania: No  Psychosis: No  Schizophrenia: No  Personality Disorder: No  Hospitalization for psychiatric illness: Yes  History of Electroconvulsive Shock Therapy: No  Prior Suicide Attempts: No   Physical Exam: Constitutional:  BP 116/78 mmHg  Pulse 82  Ht 5\' 4"  (1.626 m)  Wt 113 lb 12.8 oz (51.619 kg)  BMI 19.52 kg/m2  General Appearance: alert, oriented, no acute distress  Musculoskeletal: Strength & Muscle Tone: within normal limits Gait & Station: normal Patient leans: straight  Mental Status Examination/Evaluation: Objective: Attitude: Calm and cooperative  Appearance: Fairly Groomed, appears to be stated age  Eye Contact::  Good  Speech:  Clear and Coherent and Normal Rate  Volume:  Normal  Mood: depressed and anxious  Affect:  Congruent- brighter and calmer than at previous appts  Thought Process:  Goal Directed, Linear and Logical  Orientation:  Full (Time, Place, and Person)  Thought Content:  Negative  Suicidal Thoughts:  No  Homicidal  Thoughts:  No  Judgement:  Good  Insight:  Good  Concentration: poor  Memory: Immediate-poor Recent-fair Remote-poor  Recall: fair  Language: fair  Gait and Station: normal  [employer] of Knowledge: average  Psychomotor Activity:  Normal  Akathisia:  No  Handed:  Right   AIMS (if indicated): n/a  Assets:  Manufacturing systems engineerCommunication Skills Desire for Improvement Financial Resources/Insurance Housing Intimacy Leisure Time Physical Health Resilience Social Support MicrobiologistTalents/Skills Transportation Vocational/Educational      Medical Decision Making (Choose Three): Established Problem, Stable/Improving (1), Review of Psycho-Social Stressors (1) and Review of Medication Regimen & Side Effects (2)  Assessment: Axis I: MDD- recurrent, moderate; r/o Bipolar disorder type II- depressed mood, Insomnia; Eating disorder; Anxiety state Axis II: deferred    Plan: 1.Continue Celexa  40mg  po qD for mood 2. Lamictal 125mg  po qd for mood issues. Fyavolv can cause decrease in Lamictal effectiveness. Pt may need dose increase if depression symptoms worsen.  3. Lunesta 3mg  po qHS prn insomnia.  4. Vyvanse 10mg  po qD for BED -Risks and benefits, side effects and alternatives discussed with patient, pt was given an opportunity to ask questions about medication, illness, and treatment. All current psychiatric medications have been reviewed and discussed with the patient  and adjusted as clinically appropriate. The patient has been provided an accurate and updated list of the medications being now prescribed.  Labs- reviewed labs from PCP done on 12/16/2014 CBC WNL, CMP WNL, TSH WNL  Therapy: brief supportive therapy provided. Discussed psychosocial stressors in detail. Pt is working with Sharlette Dense for therapy  Recent Psychological testing show deficits in long term and short term memory and immediate recall.   Pt denies SI and is at an acute low risk for suicide.Patient told to call clinic if any  problems occur. Patient advised to go to ER  if she should develop SI/HI, side effects, or if symptoms worsen. Has crisis numbers to call if needed. Pt verbalized understanding.  F/up in 8 weeks or sooner if needed  Oletta Darter, MD 10/08/2015

## 2015-12-08 ENCOUNTER — Encounter (HOSPITAL_COMMUNITY): Payer: Self-pay | Admitting: Psychiatry

## 2015-12-08 ENCOUNTER — Ambulatory Visit (INDEPENDENT_AMBULATORY_CARE_PROVIDER_SITE_OTHER): Payer: Managed Care, Other (non HMO) | Admitting: Psychiatry

## 2015-12-08 VITALS — BP 108/72 | HR 68 | Ht 64.0 in | Wt 120.2 lb

## 2015-12-08 DIAGNOSIS — G4700 Insomnia, unspecified: Secondary | ICD-10-CM | POA: Diagnosis not present

## 2015-12-08 DIAGNOSIS — F411 Generalized anxiety disorder: Secondary | ICD-10-CM

## 2015-12-08 DIAGNOSIS — F5081 Binge eating disorder: Secondary | ICD-10-CM

## 2015-12-08 DIAGNOSIS — F331 Major depressive disorder, recurrent, moderate: Secondary | ICD-10-CM | POA: Diagnosis not present

## 2015-12-08 MED ORDER — LISDEXAMFETAMINE DIMESYLATE 10 MG PO CAPS: 10.0000 mg | ORAL_CAPSULE | ORAL | Status: DC

## 2015-12-08 MED ORDER — LAMOTRIGINE 100 MG PO TABS: ORAL_TABLET | ORAL | Status: DC

## 2015-12-08 MED ORDER — ESZOPICLONE 2 MG PO TABS: 2.0000 mg | ORAL_TABLET | Freq: Every evening | ORAL | Status: DC | PRN

## 2015-12-08 MED ORDER — CITALOPRAM HYDROBROMIDE 40 MG PO TABS: 40.0000 mg | ORAL_TABLET | Freq: Every day | ORAL | Status: DC

## 2015-12-08 MED ORDER — LAMOTRIGINE 25 MG PO TABS: ORAL_TABLET | ORAL | Status: DC

## 2015-12-08 MED ORDER — LISDEXAMFETAMINE DIMESYLATE 10 MG PO CAPS
10.0000 mg | ORAL_CAPSULE | Freq: Every day | ORAL | Status: DC
Start: 2015-12-08 — End: 2016-03-10

## 2015-12-08 NOTE — Progress Notes (Signed)
Patient ID: Monica BurkeBrenda W Jensen, female   DOB: [birth year], 50 y.o.   MRN: [id number] Patient ID: Monica BurkeBrenda W Jensen, female   DOB: [birth year], 50 y.o.   MRN: [id number] [facility] [id number] Progress Note  Monica BurkeBrenda W Jensen [id number] 50 y.o.  12/08/2015 9:01 AM  Chief Complaint: "things are really well  History of Present Illness:  Irritability is decreased since starting Vyvanse.  Reports she is more focused, more productive, more energetic with Vyvanse.   Depression is continuing to improve. Denies crying spells.  Notes improvement in anhedonia, worthlessness and hopelessness.   Reports several ongoing stressors and she is in counseling. Things with her fiance have improved.   Pt is able to fall asleep with Lunesta 3mg  but wakes up after 4-5 hrs and is not able to fall back asleep. She takes Lunesta every night.  Pt wakes up tired and would like to go back down to 2mg .   Anxiety has significantly improved.   Pt has struggled with eating disorder since teens. The days she takes Vyvanse she is not thinking about food or binging. On days she doesn't take Vyvanse she struggles. No longer restricting and pt reports she is eating well.   Denies manic and hypomanic symptoms including periods of decreased need for sleep, increased energy, mood lability, impulsivity, FOI, and excessive spending. On good days she is "elated".  Pt is taking Lamictal, Vyvanse, Lunesta and Celexa daily as prescribed and denies SE.  Suicidal Ideation: No   Plan Formed: No Patient has means to carry out plan: No  Homicidal Ideation: No Plan Formed: No Patient has means to carry out plan: No  Review of Systems: Psychiatric: Agitation: Yes- improving Hallucination: No Depressed Mood: Yes Insomnia: Yes Hypersomnia: No Altered Concentration: No Feels Worthless: Yes Grandiose Ideas: No Belief In Special Powers: No New/Increased Substance Abuse: No Compulsions: No  Neurologic: Headache: No-  migraines Seizure: No Paresthesias: No    Review of Systems  Constitutional: Negative for fever, chills, weight loss and malaise/fatigue.  HENT: Negative for congestion, ear pain, nosebleeds and sore throat.   Eyes: Negative for blurred vision, double vision, pain and redness.  Respiratory: Positive for cough and shortness of breath. Negative for wheezing.   Cardiovascular: Negative for chest pain, palpitations and leg swelling.  Gastrointestinal: Positive for heartburn, abdominal pain and diarrhea. Negative for nausea, vomiting and blood in stool.  Musculoskeletal: Negative for back pain, joint pain and neck pain.  Skin: Negative for itching and rash.  Neurological: Positive for sensory change. Negative for dizziness, tingling, seizures, loss of consciousness, weakness and headaches.  Psychiatric/Behavioral: Positive for depression and memory loss. Negative for suicidal ideas, hallucinations and substance abuse. The patient has insomnia. The patient is not nervous/anxious.      Past Medical Family, Social History: lives with fiance and her 13yo son. Drinks 2-3 glasses of wine 2-3 times a week. She smokes 3-4 cigarettes every few days. Has been smoking since age 50. Denies illicit drug use. Reports strong family hx of depression and anxiety and alcohol abuse.   Past Medical History  Diagnosis Date  . Thyroid disease   . Depression   . GERD (gastroesophageal reflux disease)   . Hypothyroidism   . Headache(784.0)     migraines  . Anemia   . Complication of anesthesia     Epinephrine"tachycardia"-dentist  . History of kidney stones     passed  . Concussion with brief LOC   . Memory loss   . Hypersomnolence disorder, subacute  09/09/2013    Sleepiness has increased over the last 6 month, she has been using Ambien for 20 years. Fears memory loss. Questions  About  Recent memory, loss related to [place].   . Broken foot   . Menopause   . Ovarian cyst     Outpatient Encounter  Prescriptions as of 12/08/2015  Medication Sig  . citalopram (CELEXA) 40 MG tablet Take 1 tablet (40 mg total) by mouth daily.  . Eszopiclone 3 MG TABS Take 1 tablet (3 mg total) by mouth at bedtime as needed. Take immediately before bedtime  . hydrocortisone (ANUSOL-HC) 2.5 % rectal cream Place 1 application rectally 2 (two) times daily.  Marland Kitchen ibuprofen (ADVIL,MOTRIN) 200 MG tablet Take 600 mg by mouth every 6 (six) hours as needed.  . lamoTRIgine (LAMICTAL) 100 MG tablet Take 125mg  po qD  . lamoTRIgine (LAMICTAL) 25 MG tablet Take 125mg  po qD  . levothyroxine (SYNTHROID, LEVOTHROID) 88 MCG tablet Take 88 mcg by mouth daily before breakfast.  . Lisdexamfetamine Dimesylate 10 MG CAPS Take 10 mg by mouth daily.  . Lisdexamfetamine Dimesylate 10 MG CAPS Take 10 mg by mouth every morning.  . naproxen (NAPROSYN) 250 MG tablet Take 250 mg by mouth 3 (three) times daily with meals. Reported on 06/25/2015  . norethindrone-ethinyl estradiol (FYAVOLV) 1-5 MG-MCG TABS Take 1 tablet by mouth daily.  . pantoprazole (PROTONIX) 40 MG tablet Take 40 mg by mouth daily.   No facility-administered encounter medications on file as of 12/08/2015.    Past Psychiatric History/Hospitalization(s): Anxiety: yes Bipolar Disorder: No Depression: Yes Mania: No  Psychosis: No  Schizophrenia: No  Personality Disorder: No  Hospitalization for psychiatric illness: Yes  History of Electroconvulsive Shock Therapy: No  Prior Suicide Attempts: No   Physical Exam: Constitutional:  BP 108/72 mmHg  Pulse 68  Ht 5\' 4"  (1.626 m)  Wt 120 lb 3.2 oz (54.522 kg)  BMI 20.62 kg/m2  General Appearance: alert, oriented, no acute distress  Musculoskeletal: Strength & Muscle Tone: within normal limits Gait & Station: normal Patient leans: straight  Mental Status Examination/Evaluation: Objective: Attitude: Calm and cooperative  Appearance: Fairly Groomed, appears to be stated age  Eye Contact::  Good  Speech:  Clear and  Coherent and Normal Rate  Volume:  Normal  Mood: euthymic  Affect:  Congruent- brighter and calmer than at previous appts  Thought Process:  Goal Directed, Linear and Logical  Orientation:  Full (Time, Place, and Person)  Thought Content:  Negative  Suicidal Thoughts:  No  Homicidal Thoughts:  No  Judgement:  Good  Insight:  Good  Concentration: poor  Memory: Immediate-poor Recent-fair Remote-poor  Recall: fair  Language: fair  Gait and Station: normal  [employer] of Knowledge: average  Psychomotor Activity:  Normal  Akathisia:  No  Handed:  Right   AIMS (if indicated): n/a  Assets:  Manufacturing systems engineer Desire for Improvement Financial Resources/Insurance Housing Intimacy Leisure Time Physical Health Resilience Social Support Microbiologist Decision Making (Choose Three): Established Problem, Stable/Improving (1), Review of Psycho-Social Stressors (1) and Review of Medication Regimen & Side Effects (2)  Assessment: Axis I: MDD- recurrent, moderate; r/o Bipolar disorder type II- depressed mood, Insomnia; Eating disorder; Anxiety state Axis II: deferred    Plan: 1.Continue Celexa  40mg  po qD for mood 2. Lamictal 125mg  po qd for mood issues. Fyavolv can cause decrease in Lamictal effectiveness. Pt may need dose increase if depression symptoms worsen.  3. Decrease  Lunesta  po qHS prn insomnia.  4. Vyvanse  po qD for BED -Risks and benefits, side effects and alternatives discussed with patient, pt was given an opportunity to ask questions about medication, illness, and treatment. All current psychiatric medications have been reviewed and discussed with the patient and adjusted as clinically appropriate. The patient has been provided an accurate and updated list of the medications being now prescribed.  Labs- reviewed labs from PCP done on 12/16/2014 CBC WNL, CMP WNL, TSH WNL  Therapy: brief supportive  therapy provided. Discussed psychosocial stressors in detail. Pt is working with Sharlette Dense for therapy  Recent Psychological testing show deficits in long term and short term memory and immediate recall.   Pt denies SI and is at an acute low risk for suicide.Patient told to call clinic if any problems occur. Patient advised to go to ER  if she should develop SI/HI, side effects, or if symptoms worsen. Has crisis numbers to call if needed. Pt verbalized understanding.  F/up in 12 weeks or sooner if needed  Oletta Darter, MD 12/08/2015

## 2016-03-10 ENCOUNTER — Encounter (HOSPITAL_COMMUNITY): Payer: Self-pay | Admitting: Psychiatry

## 2016-03-10 ENCOUNTER — Ambulatory Visit (INDEPENDENT_AMBULATORY_CARE_PROVIDER_SITE_OTHER): Payer: Managed Care, Other (non HMO) | Admitting: Psychiatry

## 2016-03-10 DIAGNOSIS — G4700 Insomnia, unspecified: Secondary | ICD-10-CM

## 2016-03-10 DIAGNOSIS — F411 Generalized anxiety disorder: Secondary | ICD-10-CM | POA: Diagnosis not present

## 2016-03-10 DIAGNOSIS — F5081 Binge eating disorder: Secondary | ICD-10-CM

## 2016-03-10 DIAGNOSIS — F331 Major depressive disorder, recurrent, moderate: Secondary | ICD-10-CM | POA: Diagnosis not present

## 2016-03-10 DIAGNOSIS — F50819 Binge eating disorder, unspecified: Secondary | ICD-10-CM

## 2016-03-10 MED ORDER — LISDEXAMFETAMINE DIMESYLATE 20 MG PO CAPS: 20.0000 mg | ORAL_CAPSULE | Freq: Every day | ORAL | 0 refills | Status: DC

## 2016-03-10 MED ORDER — ESZOPICLONE 2 MG PO TABS: 2.0000 mg | ORAL_TABLET | Freq: Every evening | ORAL | 0 refills | Status: DC | PRN

## 2016-03-10 NOTE — Progress Notes (Signed)
Patient ID: Monica Jensen, female   DOB: 19-Jan-1966, 50 y.o.   MRN: [id number] Patient ID: Monica BurkeBrenda W Jensen, female   DOB: 19-Jan-1966, 50 y.o.   MRN: [id number] [facility] [id number] Progress Note  Monica Jensen [id number] 50 y.o.  03/10/2016 8:52 AM  Chief Complaint: "pretty good"  History of Present Illness:  Irritability is starting to increase again even with Vyvanse.  Reports she is not as focused, productive, or as energetic with Vyvanse.   Depression is continuing to improve. Denies crying spells.  Notes improvement in anhedonia, worthlessness and hopelessness.   Reports several ongoing stressors and she is in counseling. Things with her fiance have improved.   Pt is able to fall asleep with Lunesta 2mg  but wakes up after 4-5 hrs and is not able to fall back asleep. She takes Lunesta every night and it helps.  Pt thinks she might be premenopausal.   Anxiety was significantly improved but this week she woke up a few times with a "sense of doom". It will last for a few hours and then resolve.   Pt has struggled with eating disorder since teens. Vyvanse was helping to stop her binging episodes but now notes binging is starting up again about 2x/week. She will eat large quantities of food for 24hrs and then vomit 1-2x. It is on days that she is emotionally exhausted. She can "numb out" while binging.    Denies manic and hypomanic symptoms including periods of decreased need for sleep, increased energy, mood lability, impulsivity, FOI, and excessive spending. On good days she is "elated".  Pt is taking Lamictal, Vyvanse, Lunesta and Celexa daily as prescribed and denies SE.  Suicidal Ideation: No   Plan Formed: No Patient has means to carry out plan: No  Homicidal Ideation: No Plan Formed: No Patient has means to carry out plan: No  Review of Systems: Psychiatric: Agitation: Yes- Hallucination: No Depressed Mood: Yes Insomnia: Yes Hypersomnia: No Altered  Concentration: Yes Feels Worthless: No Grandiose Ideas: No Belief In Special Powers: No New/Increased Substance Abuse: No Compulsions: No  Neurologic: Headache: No- migraines Seizure: No Paresthesias: No    Review of Systems  Constitutional: Positive for malaise/fatigue. Negative for chills, fever and weight loss.  HENT: Negative for congestion, ear pain, nosebleeds and sore throat.   Eyes: Negative for blurred vision, double vision, pain and redness.  Respiratory: Positive for cough and shortness of breath. Negative for wheezing.   Cardiovascular: Negative for chest pain, palpitations and leg swelling.  Gastrointestinal: Negative for abdominal pain, blood in stool, diarrhea, heartburn, nausea and vomiting.  Musculoskeletal: Negative for back pain, joint pain and neck pain.  Skin: Negative for itching and rash.  Neurological: Positive for sensory change. Negative for dizziness, tingling, seizures, loss of consciousness, weakness and headaches.  Psychiatric/Behavioral: Positive for depression and memory loss. Negative for hallucinations, substance abuse and suicidal ideas. The patient has insomnia. The patient is not nervous/anxious.      Past Medical Family, Social History: lives with fiance and her 13yo son. Drinks 2-3 glasses of wine 2-3 times a week. She smokes 3-4 cigarettes every few days. Has been smoking since age 50. Denies illicit drug use. Reports strong family hx of depression and anxiety and alcohol abuse.   Past Medical History:  Diagnosis Date  . Anemia   . Broken foot   . Complication of anesthesia    Epinephrine"tachycardia"-dentist  . Concussion with brief LOC   . Depression   . GERD (gastroesophageal reflux  disease)   . Headache(784.0)    migraines  . History of kidney stones    passed  . 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 Fears memory loss. Questions  About   Recent memory, loss related to [place].   . Hypothyroidism   . Memory loss   . Menopause   . Ovarian cyst   . Thyroid disease     Outpatient Encounter Prescriptions as of 03/10/2016  Medication Sig  . citalopram (CELEXA) 40 MG tablet Take 1 tablet (40 mg total) by mouth daily.  . eszopiclone (LUNESTA) 2 MG TABS tablet Take 1 tablet (2 mg total) by mouth at bedtime as needed for sleep. Take immediately before bedtime  . hydrocortisone (ANUSOL-HC) 2.5 % rectal cream Place 1 application rectally 2 (two) times daily.  Marland Kitchen ibuprofen (ADVIL,MOTRIN) 200 MG tablet Take 600 mg by mouth every 6 (six) hours as needed.  . lamoTRIgine (LAMICTAL) 100 MG tablet Take 125mg  po qD  . lamoTRIgine (LAMICTAL) 25 MG tablet Take 125mg  po qD  . levothyroxine (SYNTHROID, LEVOTHROID) 88 MCG tablet Take 88 mcg by mouth daily before breakfast.  . Lisdexamfetamine Dimesylate (VYVANSE) 10 MG CAPS Take 10 mg by mouth every morning.  . Lisdexamfetamine Dimesylate 10 MG CAPS Take 10 mg by mouth every morning.  . Lisdexamfetamine Dimesylate 10 MG CAPS Take 10 mg by mouth daily.  . naproxen (NAPROSYN) 250 MG tablet Take 250 mg by mouth 3 (three) times daily with meals. Reported on 06/25/2015  . norethindrone-ethinyl estradiol (FYAVOLV) 1-5 MG-MCG TABS Take 1 tablet by mouth daily.  . pantoprazole (PROTONIX) 40 MG tablet Take 40 mg by mouth daily.   No facility-administered encounter medications on file as of 03/10/2016.     Past Psychiatric History/Hospitalization(s): Anxiety: yes Bipolar Disorder: No Depression: Yes Mania: No  Psychosis: No  Schizophrenia: No  Personality Disorder: No  Hospitalization for psychiatric illness: Yes  History of Electroconvulsive Shock Therapy: No  Prior Suicide Attempts: No   Physical Exam: Constitutional:  BP 102/68   Pulse 77   Ht 5\' 4"  (1.626 m)   Wt 115 lb (52.2 kg)   BMI 19.74 kg/m   General Appearance: alert, oriented, no acute distress  Musculoskeletal: Strength &  Muscle Tone: within normal limits Gait & Station: normal Patient leans: straight  Mental Status Examination/Evaluation: Objective: Attitude: Calm and cooperative  Appearance: Fairly Groomed, appears to be stated age  Eye Contact::  Good  Speech:  Clear and Coherent and Normal Rate  Volume:  Normal  Mood: anxious  Affect:  Congruent  Thought Process:  Goal Directed, Linear and Logical  Orientation:  Full (Time, Place, and Person)  Thought Content:  Negative  Suicidal Thoughts:  No  Homicidal Thoughts:  No  Judgement:  Good  Insight:  Good  Concentration: poor  Memory: Immediate-poor Recent-fair Remote-poor  Recall: fair  Language: fair  Gait and Station: normal  [employer] of Knowledge: average  Psychomotor Activity:  Normal  Akathisia:  No  Handed:  Right   AIMS (if indicated): n/a  Assets:  Communication Skills Desire for Improvement Financial Resources/Insurance Housing Intimacy Leisure Time Physical Health Resilience Social Support Talents/Skills Transportation Vocational/Educational      Assessment: Axis I: MDD- recurrent, moderate; r/o Bipolar disorder type II- depressed mood, Insomnia; Eating disorder; Anxiety state Axis II: deferred    Plan: 1.Continue Celexa  40mg  po qD for mood 2. Lamictal 125mg  po  qd for mood issues. Fyavolv can cause decrease in Lamictal effectiveness. Pt may need dose increase if depression symptoms worsen.  3. Lunesta 2mg  po qHS prn insomnia.  4. Increase Vyvanse to 20mg  po qD for BED -Risks and benefits, side effects and alternatives discussed with patient, pt was given an opportunity to ask questions about medication, illness, and treatment. All current psychiatric medications have been reviewed and discussed with the patient and adjusted as clinically appropriate. The patient has been provided an accurate and updated list of the medications being now prescribed.  Labs- reviewed labs from PCP done on 12/16/2014 CBC WNL, CMP  WNL, TSH WNL  Therapy: brief supportive therapy provided. Discussed psychosocial stressors in detail. Pt is working with Sharlette Dense for therapy  Recent Psychological testing show deficits in long term and short term memory and immediate recall.   Pt denies SI and is at an acute low risk for suicide.Patient told to call clinic if any problems occur. Patient advised to go to ER  if she should develop SI/HI, side effects, or if symptoms worsen. Has crisis numbers to call if needed. Pt verbalized understanding.  F/up in 12 weeks or sooner if needed  Oletta Darter, MD 03/10/2016

## 2016-06-23 ENCOUNTER — Encounter (HOSPITAL_COMMUNITY): Payer: Self-pay | Admitting: Psychiatry

## 2016-06-23 ENCOUNTER — Ambulatory Visit (INDEPENDENT_AMBULATORY_CARE_PROVIDER_SITE_OTHER): Payer: Managed Care, Other (non HMO) | Admitting: Psychiatry

## 2016-06-23 DIAGNOSIS — Z79899 Other long term (current) drug therapy: Secondary | ICD-10-CM

## 2016-06-23 DIAGNOSIS — F99 Mental disorder, not otherwise specified: Secondary | ICD-10-CM

## 2016-06-23 DIAGNOSIS — F5081 Binge eating disorder: Secondary | ICD-10-CM | POA: Diagnosis not present

## 2016-06-23 DIAGNOSIS — F5105 Insomnia due to other mental disorder: Secondary | ICD-10-CM | POA: Diagnosis not present

## 2016-06-23 DIAGNOSIS — F331 Major depressive disorder, recurrent, moderate: Secondary | ICD-10-CM

## 2016-06-23 DIAGNOSIS — F411 Generalized anxiety disorder: Secondary | ICD-10-CM

## 2016-06-23 MED ORDER — LAMOTRIGINE 25 MG PO TABS: ORAL_TABLET | ORAL | 0 refills | Status: DC

## 2016-06-23 MED ORDER — LISDEXAMFETAMINE DIMESYLATE 20 MG PO CAPS: 20.0000 mg | ORAL_CAPSULE | Freq: Every day | ORAL | 0 refills | Status: DC

## 2016-06-23 NOTE — Progress Notes (Signed)
Patient ID: Monica Jensen, female   DOB: 15-Jan-1966, 50 y.o.   MRN: [id number] Patient ID: Monica Jensen, female   DOB: 15-Jan-1966, 50 y.o.   MRN: [id number] [facility] [id number] Progress Note  Monica Jensen [id number] 50 y.o.  06/23/2016 9:39 AM  Chief Complaint: "its been a very trying time"  History of Present Illness: reviewed information with patient on 06/23/16  and same as previous visits except as noted  Pt states her son was very sick and it was very stressful. She is still stressed about him and his health.  She is having significant hip pain and is getting steroid shots and pills.   Pt is having a lot of problems with her fiance and she is very angry with him. Feels he was not there for her during a crisis with her son.   Reports she is focused, productive and energetic with Vyvanse.   Overall she is doing ok with the depression.  She is overwhelmed and has frequent crying spells. She has random days of sad mood and low motivation. Denies SI/HI.   Pt is sleeping on the couch by choice. She is able to fall asleep with Lunesta 2mg  but wakes up after 4-5 hrs and is not able to fall back asleep. She takes Lunesta every night and it helps.  Pt also has significant pain that sometimes prevents her from sleeping.    Anxiety is a little worse due to recent stressors. Pt denies panic attacks but states she feels like she might have one.    Vyvanse helps to decrease her binging episodes. At night she will binge about 2-3x/week. She will eat large quantities of food for 12hrs and then vomit 1x per binge. It is on days that she is emotionally exhausted. She can "numb out" while binging.    Denies manic and hypomanic symptoms including periods of decreased need for sleep, increased energy, mood lability, impulsivity, FOI, and excessive spending.  Pt is taking Lamictal, Vyvanse, Lunesta and Celexa daily as prescribed and denies SE.  Suicidal Ideation: No   Plan Formed: No  Patient has means to carry out plan: No  Homicidal Ideation: No Plan Formed: No Patient has means to carry out plan: No  Review of Systems: Psychiatric: Agitation: Yes but only with fiance Hallucination: No Depressed Mood: Yes Insomnia: Yes Hypersomnia: No Altered Concentration: Yes Feels Worthless: No Grandiose Ideas: No Belief In Special Powers: No New/Increased Substance Abuse: No Compulsions: No  Neurologic: Headache: No- migraines Seizure: No Paresthesias: No    Review of Systems  Constitutional: Positive for malaise/fatigue. Negative for chills, fever and weight loss.  Respiratory: Positive for cough. Negative for shortness of breath.   Musculoskeletal: Positive for joint pain. Negative for back pain and neck pain.  Neurological: Negative for dizziness, tingling, sensory change, seizures, loss of consciousness, weakness and headaches.  Psychiatric/Behavioral: Positive for depression and memory loss. Negative for hallucinations, substance abuse and suicidal ideas. The patient has insomnia. The patient is not nervous/anxious.      Past Medical, Family, Social History: reviewed information with patient on 06/23/16  and same as previous visits except as noted lives with fiance and her 13yo son. Drinks 2-3 glasses of wine 2-3 times a week. She smokes 3-4 cigarettes every few days. Has been smoking since age 50. Denies illicit drug use. Reports strong family hx of depression and anxiety and alcohol abuse.   Past Medical History:  Diagnosis Date  . Anemia   . Broken  foot   . Complication of anesthesia    Epinephrine"tachycardia"-dentist  . Concussion with brief LOC   . Depression   . GERD (gastroesophageal reflux disease)   . Headache(784.0)    migraines  . History of kidney stones    passed  . 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 Fears memory loss. Questions  About  Recent  memory, loss related [place]   . Hypothyroidism   . Memory loss   . Menopause   . Ovarian cyst   . Thyroid disease     Outpatient Encounter Prescriptions as of 06/23/2016  Medication Sig  . citalopram (CELEXA) 40 MG tablet Take 1 tablet (40 mg total) by mouth daily.  . eszopiclone (LUNESTA) 2 MG TABS tablet Take 1 tablet (2 mg total) by mouth at bedtime as needed for sleep. Take immediately before bedtime  . hydrocortisone (ANUSOL-HC) 2.5 % rectal cream Place 1 application rectally 2 (two) times daily.  Marland Kitchen. ibuprofen (ADVIL,MOTRIN) 200 MG tablet Take 600 mg by mouth every 6 (six) hours as needed.  . lamoTRIgine (LAMICTAL) 100 MG tablet Take 125mg  po qD  . lamoTRIgine (LAMICTAL) 25 MG tablet Take 125mg  po qD  . levothyroxine (SYNTHROID, LEVOTHROID) 88 MCG tablet Take 88 mcg by mouth daily before breakfast.  . lisdexamfetamine (VYVANSE) 20 MG capsule Take 1 capsule (20 mg total) by mouth daily.  Marland Kitchen. lisdexamfetamine (VYVANSE) 20 MG capsule Take 1 capsule (20 mg total) by mouth daily.  Marland Kitchen. lisdexamfetamine (VYVANSE) 20 MG capsule Take 1 capsule (20 mg total) by mouth daily.  . naproxen (NAPROSYN) 250 MG tablet Take 250 mg by mouth 3 (three) times daily with meals. Reported on 06/25/2015  . norethindrone-ethinyl estradiol (FYAVOLV) 1-5 MG-MCG TABS Take 1 tablet by mouth daily.  . pantoprazole (PROTONIX) 40 MG tablet Take 40 mg by mouth daily.  . predniSONE (STERAPRED UNI-PAK 21 TAB) 10 MG (21) TBPK tablet Take 10 mg by mouth daily.   No facility-administered encounter medications on file as of 06/23/2016.     Past Psychiatric History/Hospitalization(s): Anxiety: yes Bipolar Disorder: No Depression: Yes Mania: No  Psychosis: No  Schizophrenia: No  Personality Disorder: No  Hospitalization for psychiatric illness: Yes  History of Electroconvulsive Shock Therapy: No  Prior Suicide Attempts: No   Physical Exam: Constitutional:  BP 118/68   Pulse 78   Ht 5\' 4"  (1.626 m)   Wt 111 lb  (50.3 kg)   BMI 19.05 kg/m   General Appearance: alert, oriented, no acute distress  Musculoskeletal: Strength & Muscle Tone: within normal limits Gait & Station: normal Patient leans: straight  Mental Status Examination/Evaluation: reviewed information on 06/23/16  and same as previous visits except as noted  Objective: Attitude: Calm and cooperative  Appearance: Fairly Groomed, appears to be stated age  Eye Contact::  Good  Speech:  Clear and Coherent and Normal Rate  Volume:  Normal  Mood: anxious and depressed  Affect:  Congruent  Thought Process:  Goal Directed, Linear and Logical  Orientation:  Full (Time, Place, and Person)  Thought Content:  Negative  Suicidal Thoughts:  No  Homicidal Thoughts:  No  Judgement:  Good  Insight:  Good  Concentration: good  Memory: Immediate good Recent-fair Remote-poor  Recall: fair  Language: fair  Gait and Station: normal  [employer] Knowledge: average  Psychomotor Activity:  Normal  Akathisia:  No  Handed:  Right  AIMS (if indicated): n/a  Assets:  Communication Skills Desire for Improvement Financial Resources/Insurance Housing Intimacy Leisure Time Physical Health Resilience Social Support Talents/Skills Transportation Vocational/Educational      Assessment: reviewed information on 06/23/16  and same as previous visits except as noted  Axis I: MDD- recurrent, moderate; r/o Bipolar disorder type II- depressed mood, Insomnia; Eating disorder; Anxiety state Axis II: deferred    Plan: reviewed information on 06/23/16  and same as previous visits except as noted 1.Continue Celexa  40mg  po qD for mood 2. Lamictal 125mg  po qd for mood issues. Fyavolv can cause decrease in Lamictal effectiveness.  3. Lunesta 2mg  po qHS prn insomnia.  4. Vyvanse to 20mg  po qD for BED Pt does not want meds changed today -Risks and benefits, side effects and alternatives discussed with patient, pt was given an opportunity to  ask questions about medication, illness, and treatment. All current psychiatric medications have been reviewed and discussed with the patient and adjusted as clinically appropriate. The patient has been provided an accurate and updated list of the medications being now prescribed.  Labs- reviewed labs from PCP done on 12/16/2014 CBC WNL, CMP WNL, TSH WNL  Therapy: brief supportive therapy provided. Discussed psychosocial stressors in detail. Pt is working with Sharlette Dense for therapy about once a week  Recent Psychological testing show deficits in long term and short term memory and immediate recall.   Pt denies SI and is at an acute low risk for suicide.Patient told to call clinic if any problems occur. Patient advised to go to ER  if she should develop SI/HI, side effects, or if symptoms worsen. Has crisis numbers to call if needed. Pt verbalized understanding.  F/up in 10 weeks or sooner if needed  Oletta Darter, MD 06/23/2016

## 2016-07-13 ENCOUNTER — Other Ambulatory Visit: Payer: Self-pay | Admitting: Internal Medicine

## 2016-07-13 ENCOUNTER — Ambulatory Visit
Admission: RE | Admit: 2016-07-13 | Discharge: 2016-07-13 | Disposition: A | Discharge status: Home / Self Care | Payer: Managed Care, Other (non HMO) | Source: Ambulatory Visit | Attending: Internal Medicine | Admitting: Internal Medicine

## 2016-07-13 DIAGNOSIS — R05 Cough: Secondary | ICD-10-CM

## 2016-07-13 DIAGNOSIS — R053 Chronic cough: Secondary | ICD-10-CM

## 2016-07-29 ENCOUNTER — Telehealth (HOSPITAL_COMMUNITY): Payer: Self-pay

## 2016-07-29 DIAGNOSIS — F331 Major depressive disorder, recurrent, moderate: Secondary | ICD-10-CM

## 2016-07-29 MED ORDER — LAMOTRIGINE 100 MG PO TABS: ORAL_TABLET | ORAL | 0 refills | Status: DC

## 2016-07-29 MED ORDER — LAMOTRIGINE 25 MG PO TABS: ORAL_TABLET | ORAL | 0 refills | Status: DC

## 2016-07-29 NOTE — Telephone Encounter (Signed)
Medication refill request - Fax received from [place] for new 90 day orders of patient's Lamictal 100 mg and 25 mg dosages as patient takes one of each for a total of 125 mg a day.  Pharmacy requested corrected dosages to equal 125 mg a day.  Met with Dr. Adele Schilder who approved a new 100 mg and 25 mg dosage for 90 days of each and e-scribed both 90 day orders to [place] as approved.

## 2016-08-10 ENCOUNTER — Other Ambulatory Visit: Payer: Self-pay | Admitting: Gastroenterology

## 2016-08-23 ENCOUNTER — Ambulatory Visit (HOSPITAL_COMMUNITY): Payer: Self-pay | Admitting: Psychiatry

## 2016-09-15 ENCOUNTER — Ambulatory Visit (INDEPENDENT_AMBULATORY_CARE_PROVIDER_SITE_OTHER): Payer: Managed Care, Other (non HMO) | Admitting: Psychiatry

## 2016-09-15 ENCOUNTER — Encounter (HOSPITAL_COMMUNITY): Payer: Self-pay | Admitting: Psychiatry

## 2016-09-15 DIAGNOSIS — F5105 Insomnia due to other mental disorder: Secondary | ICD-10-CM | POA: Diagnosis not present

## 2016-09-15 DIAGNOSIS — Z79899 Other long term (current) drug therapy: Secondary | ICD-10-CM | POA: Diagnosis not present

## 2016-09-15 DIAGNOSIS — F5081 Binge eating disorder: Secondary | ICD-10-CM | POA: Diagnosis not present

## 2016-09-15 DIAGNOSIS — F331 Major depressive disorder, recurrent, moderate: Secondary | ICD-10-CM | POA: Diagnosis not present

## 2016-09-15 DIAGNOSIS — F411 Generalized anxiety disorder: Secondary | ICD-10-CM

## 2016-09-15 DIAGNOSIS — F99 Mental disorder, not otherwise specified: Secondary | ICD-10-CM

## 2016-09-15 MED ORDER — LAMOTRIGINE 25 MG PO TABS: ORAL_TABLET | ORAL | 0 refills | Status: DC

## 2016-09-15 MED ORDER — LISDEXAMFETAMINE DIMESYLATE 20 MG PO CAPS: 20.0000 mg | ORAL_CAPSULE | Freq: Every day | ORAL | 0 refills | Status: DC

## 2016-09-15 MED ORDER — ALPRAZOLAM 0.5 MG PO TABS: 0.5000 mg | ORAL_TABLET | Freq: Every evening | ORAL | 0 refills | Status: DC | PRN

## 2016-09-15 MED ORDER — ESZOPICLONE 2 MG PO TABS: 2.0000 mg | ORAL_TABLET | Freq: Every evening | ORAL | 0 refills | Status: DC | PRN

## 2016-09-15 MED ORDER — LAMOTRIGINE 100 MG PO TABS: ORAL_TABLET | ORAL | 0 refills | Status: DC

## 2016-09-15 MED ORDER — CITALOPRAM HYDROBROMIDE 40 MG PO TABS: 40.0000 mg | ORAL_TABLET | Freq: Every day | ORAL | 0 refills | Status: DC

## 2016-09-15 NOTE — Progress Notes (Signed)
Patient ID: MARYLEN ZUK, female   DOB: [birth year], 51 y.o.   MRN: [id number] Patient ID: ENEDINA PAIR, female   DOB: March 09, 1966, 51 y.o.   MRN: [id number] [facility] [id number] Progress Note  CALVIN CHURA [id number] 51 y.o.  09/15/2016 9:08 AM  Chief Complaint: "alot has been going on"  History of Present Illness: reviewed information with patient on 09/15/16  and same as previous visits except as noted  Pt was diagnosed recently with COPD.  Pt is working on quitting smoking. She is also getting evaluated for Barrett's esophagus.  Things with her fiance are not good.   Work is very stressful. Pt is working 16 hrs/days. It is causing stress induced panic attacks and crying spells. She feels like she is drowning.  Vyvanse is working well and she is no concerns about it. Pt has lost weight and she is not binging.   Depression is well controlled.   Pt is sleeping on the couch by choice. She is able to fall asleep with Lunesta 2mg  but wakes up after 2-4 hrs and is not able to fall back asleep due to stress about work. Pt also has significant pain that sometimes prevents her from sleeping.    Denies manic and hypomanic symptoms including periods of decreased need for sleep, increased energy, mood lability, impulsivity, FOI, and excessive spending.  Pt is taking Lamictal, Vyvanse, Lunesta and Celexa daily as prescribed and denies SE.  Suicidal Ideation: No   Plan Formed: No Patient has means to carry out plan: No  Homicidal Ideation: No Plan Formed: No Patient has means to carry out plan: No  Review of Systems: Psychiatric: Agitation: Yes but only with fiance Hallucination: No Depressed Mood: Yes Insomnia: Yes Hypersomnia: No Altered Concentration: Yes Feels Worthless: No Grandiose Ideas: No Belief In Special Powers: No New/Increased Substance Abuse: No Compulsions: No  Neurologic: Headache: No- migraines Seizure: No Paresthesias: No    Review of Systems   Constitutional: Negative for malaise/fatigue.  Respiratory: Positive for cough and shortness of breath. Negative for hemoptysis and sputum production.   Musculoskeletal: Positive for joint pain. Negative for back pain and neck pain.  Neurological: Negative for dizziness, tremors, sensory change, speech change, seizures, loss of consciousness and headaches.  Psychiatric/Behavioral: Positive for depression. Negative for hallucinations, substance abuse and suicidal ideas. The patient is nervous/anxious and has insomnia.      Past Medical, Family, Social History: reviewed information with patient on 09/15/16  and same as previous visits except as noted lives with fiance and her 13yo son. Drinks 2-3 glasses of wine 2-3 times a week. She smokes 3-4 cigarettes every few days. Has been smoking since age 14. Denies illicit drug use. Reports strong family hx of depression and anxiety and alcohol abuse.   Past Medical History:  Diagnosis Date  . Anemia   . Broken foot   . Complication of anesthesia    Epinephrine"tachycardia"-dentist  . Concussion with brief LOC   . Depression   . GERD (gastroesophageal reflux disease)   . Headache(784.0)    migraines  . Hip pain   . History of kidney stones    passed  . 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 Fears memory loss. Questions  About  Recent memory, loss related to [place].   . Hypothyroidism   . Memory loss   . Menopause   . Ovarian cyst   .  Thyroid disease     Outpatient Encounter Prescriptions as of 09/15/2016  Medication Sig  . citalopram (CELEXA) 40 MG tablet Take 1 tablet (40 mg total) by mouth daily.  . eszopiclone (LUNESTA) 2 MG TABS tablet Take 1 tablet (2 mg total) by mouth at bedtime as needed for sleep. Take immediately before bedtime  . ibuprofen (ADVIL,MOTRIN) 200 MG tablet Take 600 mg by mouth every 6 (six) hours as needed.  .  lamoTRIgine (LAMICTAL) 100 MG tablet Take 1 a day for total of 125mg  (with 25 mg) po qD  . lamoTRIgine (LAMICTAL) 25 MG tablet Take 1 a day for total of 125mg  (with 100 mg) po qD  . levothyroxine (SYNTHROID, LEVOTHROID) 88 MCG tablet Take 88 mcg by mouth daily before breakfast.  . lisdexamfetamine (VYVANSE) 20 MG capsule Take 1 capsule (20 mg total) by mouth daily.  Marland Kitchen. lisdexamfetamine (VYVANSE) 20 MG capsule Take 1 capsule (20 mg total) by mouth daily.  Marland Kitchen. lisdexamfetamine (VYVANSE) 20 MG capsule Take 1 capsule (20 mg total) by mouth daily.  . meloxicam (MOBIC) 15 MG tablet Take 15 mg by mouth daily.  . norethindrone-ethinyl estradiol (FYAVOLV) 1-5 MG-MCG TABS Take 1 tablet by mouth daily.  . pantoprazole (PROTONIX) 40 MG tablet Take 40 mg by mouth daily.  Marland Kitchen. umeclidinium-vilanterol (ANORO ELLIPTA) 62.5-25 MCG/INH AEPB Inhale 1 puff into the lungs daily.  . hydrocortisone (ANUSOL-HC) 2.5 % rectal cream Place 1 application rectally 2 (two) times daily. (Patient not taking: Reported on 09/15/2016)  . naproxen (NAPROSYN) 250 MG tablet Take 250 mg by mouth 3 (three) times daily with meals. Reported on 06/25/2015  . predniSONE (STERAPRED UNI-PAK 21 TAB) 10 MG (21) TBPK tablet Take 10 mg by mouth daily.   No facility-administered encounter medications on file as of 09/15/2016.     Past Psychiatric History/Hospitalization(s): Anxiety: yes Bipolar Disorder: No Depression: Yes Mania: No  Psychosis: No  Schizophrenia: No  Personality Disorder: No  Hospitalization for psychiatric illness: Yes  History of Electroconvulsive Shock Therapy: No  Prior Suicide Attempts: No   Physical Exam: Constitutional:  BP 110/68   Pulse 87   Ht 5\' 4"  (1.626 m)   Wt 108 lb 6.4 oz (49.2 kg)   BMI 18.61 kg/m   General Appearance: alert, oriented, no acute distress  Musculoskeletal: Strength & Muscle Tone: within normal limits Gait & Station: normal Patient leans: straight  Mental Status  Examination/Evaluation: reviewed MSE 09/15/16  and same as previous visits except as noted  Objective: Attitude: Calm and cooperative  Appearance: Fairly Groomed, appears to be stated age  Eye Contact::  Good  Speech:  Clear and Coherent and Normal Rate  Volume:  Normal  Mood: anxious and depressed  Affect:  Congruent  Thought Process:  Goal Directed, Linear and Logical  Orientation:  Full (Time, Place, and Person)  Thought Content:  Negative  Suicidal Thoughts:  No  Homicidal Thoughts:  No  Judgement:  Good  Insight:  Good  Concentration: good  Memory: Immediate good Recent-fair Remote-poor  Recall: fair  Language: fair  Gait and Station: normal  [employer] Knowledge: average  Psychomotor Activity:  Normal  Akathisia:  No  Handed:  Right   AIMS (if indicated): n/a  Assets:  Communication Skills Desire for Improvement Financial Resources/Insurance Housing Intimacy Leisure Time Physical Health Resilience Social Support Talents/Skills Transportation Vocational/Educational      Assessment: reviewed A&P on 09/15/16  and same as previous visits except as noted  Axis I: MDD- recurrent, moderate;  r/o Bipolar disorder type II- depressed mood, Insomnia; Eating disorder; Anxiety state Axis II: deferred    Plan:1.Continue Celexa  40mg  po qD for mood 2. Lamictal 125mg  po qd for mood issues. Fyavolv can cause decrease in Lamictal effectiveness.  3. Lunesta 2mg  po qHS prn insomnia.  4. Vyvanse to 20mg  po qD for BED 5. Start trial of Xanax 0.5mg  po qD prn- given #15 tabs with no refills Pt does not want meds changed today -Risks and benefits, side effects and alternatives discussed with patient, pt was given an opportunity to ask questions about medication, illness, and treatment. All current psychiatric medications have been reviewed and discussed with the patient and adjusted as clinically appropriate. The patient has been provided an accurate and updated list of the  medications being now prescribed.  Labs- reviewed labs from PCP done on 12/16/2014 CBC WNL, CMP WNL, TSH WNL  Therapy: brief supportive therapy provided. Discussed psychosocial stressors in detail. Pt is working with Sharlette Dense for therapy about once a week  Recent Psychological testing show deficits in long term and short term memory and immediate recall.   Pt denies SI and is at an acute low risk for suicide.Patient told to call clinic if any problems occur. Patient advised to go to ER  if she should develop SI/HI, side effects, or if symptoms worsen. Has crisis numbers to call if needed. Pt verbalized understanding.  F/up in 10 weeks or sooner if needed  Oletta Darter, MD 09/15/2016

## 2016-09-21 ENCOUNTER — Encounter (HOSPITAL_COMMUNITY): Payer: Self-pay | Admitting: *Deleted

## 2016-09-26 ENCOUNTER — Encounter (HOSPITAL_COMMUNITY): Payer: Self-pay | Admitting: *Deleted

## 2016-09-26 ENCOUNTER — Other Ambulatory Visit (HOSPITAL_COMMUNITY): Payer: Self-pay | Admitting: Gastroenterology

## 2016-09-26 ENCOUNTER — Encounter (HOSPITAL_COMMUNITY)
Admission: RE | Disposition: A | Discharge status: Home / Self Care | Payer: Self-pay | Source: Ambulatory Visit | Attending: Gastroenterology

## 2016-09-26 ENCOUNTER — Ambulatory Visit (HOSPITAL_COMMUNITY): Payer: Managed Care, Other (non HMO) | Admitting: Anesthesiology

## 2016-09-26 ENCOUNTER — Ambulatory Visit (HOSPITAL_COMMUNITY)
Admission: RE | Admit: 2016-09-26 | Discharge: 2016-09-26 | Disposition: A | Discharge status: Home / Self Care | Payer: Managed Care, Other (non HMO) | Source: Ambulatory Visit | Attending: Gastroenterology | Admitting: Gastroenterology

## 2016-09-26 DIAGNOSIS — Z8673 Personal history of transient ischemic attack (TIA), and cerebral infarction without residual deficits: Secondary | ICD-10-CM | POA: Diagnosis not present

## 2016-09-26 DIAGNOSIS — Z880 Allergy status to penicillin: Secondary | ICD-10-CM | POA: Diagnosis not present

## 2016-09-26 DIAGNOSIS — K219 Gastro-esophageal reflux disease without esophagitis: Secondary | ICD-10-CM | POA: Diagnosis not present

## 2016-09-26 DIAGNOSIS — F329 Major depressive disorder, single episode, unspecified: Secondary | ICD-10-CM | POA: Insufficient documentation

## 2016-09-26 DIAGNOSIS — F17200 Nicotine dependence, unspecified, uncomplicated: Secondary | ICD-10-CM | POA: Diagnosis not present

## 2016-09-26 DIAGNOSIS — Z79899 Other long term (current) drug therapy: Secondary | ICD-10-CM | POA: Diagnosis not present

## 2016-09-26 DIAGNOSIS — J449 Chronic obstructive pulmonary disease, unspecified: Secondary | ICD-10-CM | POA: Insufficient documentation

## 2016-09-26 DIAGNOSIS — K3184 Gastroparesis: Secondary | ICD-10-CM

## 2016-09-26 DIAGNOSIS — E039 Hypothyroidism, unspecified: Secondary | ICD-10-CM | POA: Insufficient documentation

## 2016-09-26 HISTORY — DX: Transient cerebral ischemic attack, unspecified: G45.9

## 2016-09-26 HISTORY — DX: Unspecified osteoarthritis, unspecified site: M19.90

## 2016-09-26 HISTORY — PX: ESOPHAGOGASTRODUODENOSCOPY (EGD) WITH PROPOFOL: SHX5813

## 2016-09-26 HISTORY — DX: Personal history of other diseases of the digestive system: Z87.19

## 2016-09-26 SURGERY — ESOPHAGOGASTRODUODENOSCOPY (EGD) WITH PROPOFOL
Anesthesia: Monitor Anesthesia Care

## 2016-09-26 MED ORDER — PHENYLEPHRINE 40 MCG/ML (10ML) SYRINGE FOR IV PUSH (FOR BLOOD PRESSURE SUPPORT)
PREFILLED_SYRINGE | INTRAVENOUS | Status: AC
  Filled 2016-09-26: qty 10

## 2016-09-26 MED ORDER — LACTATED RINGERS IV SOLN
INTRAVENOUS | Status: DC
  Administered 2016-09-26: 12:00:00 via INTRAVENOUS
  Administered 2016-09-26: 1000 mL via INTRAVENOUS

## 2016-09-26 MED ORDER — MIDAZOLAM HCL 2 MG/2ML IJ SOLN
INTRAMUSCULAR | Status: AC
  Filled 2016-09-26: qty 2

## 2016-09-26 MED ORDER — PROPOFOL 500 MG/50ML IV EMUL
INTRAVENOUS | Status: DC | PRN
  Administered 2016-09-26: 50 mg via INTRAVENOUS

## 2016-09-26 MED ORDER — ONDANSETRON HCL 4 MG/2ML IJ SOLN
INTRAMUSCULAR | Status: DC | PRN
  Administered 2016-09-26: 4 mg via INTRAVENOUS

## 2016-09-26 MED ORDER — ONDANSETRON HCL 4 MG/2ML IJ SOLN
INTRAMUSCULAR | Status: AC
  Filled 2016-09-26: qty 6

## 2016-09-26 MED ORDER — PROPOFOL 10 MG/ML IV BOLUS
INTRAVENOUS | Status: AC
  Filled 2016-09-26: qty 40

## 2016-09-26 MED ORDER — MIDAZOLAM HCL 5 MG/5ML IJ SOLN
INTRAMUSCULAR | Status: DC | PRN
  Administered 2016-09-26: 2 mg via INTRAVENOUS

## 2016-09-26 MED ORDER — PROPOFOL 500 MG/50ML IV EMUL
INTRAVENOUS | Status: DC | PRN
  Administered 2016-09-26: 125 ug/kg/min via INTRAVENOUS

## 2016-09-26 MED ORDER — SODIUM CHLORIDE 0.9 % IV SOLN: INTRAVENOUS | Status: DC

## 2016-09-26 SURGICAL SUPPLY — 14 items

## 2016-09-26 NOTE — Anesthesia Postprocedure Evaluation (Addendum)
Anesthesia Post Note  Patient: LAI HENDRIKS  Procedure(s) Performed: Procedure(s) (LRB): ESOPHAGOGASTRODUODENOSCOPY (EGD) WITH PROPOFOL (N/A)  Patient location during evaluation: Endoscopy Anesthesia Type: MAC Level of consciousness: oriented, patient cooperative and awake and alert Pain management: pain level controlled Vital Signs Assessment: post-procedure vital signs reviewed and stable Respiratory status: spontaneous breathing, nonlabored ventilation and respiratory function stable Cardiovascular status: stable and blood pressure returned to baseline Postop Assessment: no signs of nausea or vomiting Anesthetic complications: no       Last Vitals:  Vitals:   09/26/16 1320 09/26/16 1330  BP: 125/84 (!) 135/94  Pulse: 64 71  Resp: 14 17  Temp:      Last Pain:  Vitals:   09/26/16 1256  TempSrc: Oral  PainSc:                  Aluel Schwarz,E. Carlie Solorzano

## 2016-09-26 NOTE — Progress Notes (Signed)
Dr. Laural BenesJohnson wanted patient scheduled for outpatient NM gastric emptying study. [facility]. She is to be NPO after midnight and to stop her GI meds 8 hr prior to test. A detailed message was left for the patient on her voicemail.

## 2016-09-26 NOTE — H&P (Signed)
Procedure: Diagnostic esophagogastroduodenoscopy to evaluate chronic gastroesophageal reflux. Normal screening colonoscopy was performed on 10/01/2013.  History: The patient is a 51 year old female born [birth year]. She chronically takes pantoprazole 40 mg before breakfast each morning. She continues to experience chronic heartburn and regurgitation unassociated with dysphagia. She will predictably experience heartburn and regurgitation when she exercises.  Past medical history: Gastroesophageal reflux. Irritable bowel syndrome. Depression. Migraine headache syndrome. Kidney stones. Appendectomy. Breast augmentation. Cesarean section. Fallopian tube removal. Exploratory laparotomy.  Medication allergies: Penicillin  Exam: The patient is alert and lying comfortably on the endoscopy stretcher. Abdomen is soft and nontender to palpation. Lungs are clear to auscultation. Cardiac exam reveals a regular rhythm.  Plan: Proceed with diagnostic esophagogastroduodenoscopy.

## 2016-09-26 NOTE — Op Note (Signed)
[facility] Patient Name: Monica Jensen Procedure Date: 09/26/2016 MRN: [id number] Attending MD: Charolett Bumpers , MD Date of Birth: 01-03-1966 CSN: [id number] Age: 51 Admit Type: Outpatient Procedure:                Upper GI endoscopy Indications:              Heartburn Providers:                Charolett Bumpers, MD, Anthony Sar, RN, Kandice Robinsons, Technician Referring MD:              Medicines:                Propofol per Anesthesia Complications:            No immediate complications. Estimated Blood Loss:     Estimated blood loss: none. Procedure:                Pre-Anesthesia Assessment:                           - Prior to the procedure, a History and Physical                            was performed, and patient medications and                            allergies were reviewed. The patient's tolerance of                            previous anesthesia was also reviewed. The risks                            and benefits of the procedure and the sedation                            options and risks were discussed with the patient.                            All questions were answered, and informed consent                            was obtained. Prior Anticoagulants: The patient has                            taken no previous anticoagulant or antiplatelet                            agents. ASA Grade Assessment: II - A patient with                            mild systemic disease. After reviewing the risks  and benefits, the patient was deemed in                            satisfactory condition to undergo the procedure.                           After obtaining informed consent, the endoscope was                            passed under direct vision. Throughout the                            procedure, the patient's blood pressure, pulse, and                            oxygen saturations were monitored  continuously. The                            [id number] ([id number]) scope was introduced through the                            mouth, and advanced to the second part of duodenum.                            The upper GI endoscopy was accomplished without                            difficulty. The patient tolerated the procedure                            well. Scope In: Scope Out: Findings:      The Z-line was regular and was found 37 cm from the incisors.      The examined esophagus was normal.      The entire examined stomach was normal. Solid food was present in the       gastric body suggesting gastroparesis      The in the duodenum was normal. Impression:               - Z-line regular, 37 cm from the incisors.                           - Normal esophagus.                           - Normal stomach.                           - Normal.                           - No specimens collected. Moderate Sedation:      N/A- Per Anesthesia Care Recommendation:           - Patient has a contact number available for  emergencies. The signs and symptoms of potential                            delayed complications were discussed with the                            patient. Return to normal activities tomorrow.                            Written discharge instructions were provided to the                            patient.                           - Return to primary care physician PRN. Recommend                            nuclear medicine gastric emptying study.                           - Resume previous diet.                           - Continue present medications. Procedure Code(s):        --- Professional ---                           [phone], Esophagogastroduodenoscopy, flexible,                            transoral; diagnostic, including collection of                            specimen(s) by brushing or washing, when performed                             (separate procedure) Diagnosis Code(s):        --- Professional ---                           R12, Heartburn CPT copyright 2016 American Medical Association. All rights reserved. The codes documented in this report are preliminary and upon coder review may  be revised to meet current compliance requirements. Danise Edge, MD Charolett Bumpers, MD 09/26/2016 12:58:14 PM This report has been signed electronically. Number of Addenda: 0

## 2016-09-26 NOTE — Anesthesia Preprocedure Evaluation (Addendum)
Anesthesia Evaluation  Patient identified by MRN, date of birth, ID band Patient awake    Reviewed: Allergy & Precautions, NPO status , Patient's Chart, lab work & pertinent test results  History of Anesthesia Complications Negative for: history of anesthetic complications  Airway Mallampati: I  TM Distance: >3 FB Neck ROM: Full    Dental  (+) Caps, Dental Advisory Given   Pulmonary COPD,  COPD inhaler, Current Smoker,    breath sounds clear to auscultation       Cardiovascular (-) anginanegative cardio ROS   Rhythm:Regular Rate:Normal     Neuro/Psych Depression TIA   GI/Hepatic GERD  Medicated and Poorly Controlled,(+)     substance abuse  alcohol use,   Endo/Other  Hypothyroidism   Renal/GU negative Renal ROS     Musculoskeletal   Abdominal   Peds  Hematology negative hematology ROS (+)   Anesthesia Other Findings   Reproductive/Obstetrics Post menopausal                            Anesthesia Physical Anesthesia Plan  ASA: II  Anesthesia Plan: MAC   Post-op Pain Management:    Induction: Intravenous  Airway Management Planned: Natural Airway and Nasal Cannula  Additional Equipment:   Intra-op Plan:   Post-operative Plan:   Informed Consent: I have reviewed the patients History and Physical, chart, labs and discussed the procedure including the risks, benefits and alternatives for the proposed anesthesia with the patient or authorized representative who has indicated his/her understanding and acceptance.   Dental advisory given  Plan Discussed with: CRNA and Surgeon  Anesthesia Plan Comments: (Plan routine monitors, MAC)        Anesthesia Quick Evaluation

## 2016-09-26 NOTE — Transfer of Care (Signed)
Immediate Anesthesia Transfer of Care Note  Patient: Monica Jensen  Procedure(s) Performed: Procedure(s): ESOPHAGOGASTRODUODENOSCOPY (EGD) WITH PROPOFOL (N/A)  Patient Location: PACU  Anesthesia Type:MAC  Level of Consciousness:  sedated, patient cooperative and responds to stimulation  Airway & Oxygen Therapy:Patient Spontanous Breathing and Patient connected to face mask oxgen  Post-op Assessment:  Report given to PACU RN and Post -op Vital signs reviewed and stable  Post vital signs:  Reviewed and stable  Last Vitals:  Vitals:   09/26/16 1134  BP: (!) 135/96  Pulse: 76  Resp: 12  Temp: 71.2 C    Complications: No apparent anesthesia complications

## 2016-09-26 NOTE — Discharge Instructions (Signed)

## 2016-09-28 ENCOUNTER — Encounter (HOSPITAL_COMMUNITY): Payer: Self-pay | Admitting: Gastroenterology

## 2016-10-11 ENCOUNTER — Ambulatory Visit (HOSPITAL_COMMUNITY): Payer: Managed Care, Other (non HMO)

## 2016-10-11 ENCOUNTER — Encounter (HOSPITAL_COMMUNITY): Payer: Self-pay

## 2016-11-17 ENCOUNTER — Ambulatory Visit (HOSPITAL_COMMUNITY): Payer: Self-pay | Admitting: Psychiatry

## 2017-01-05 ENCOUNTER — Encounter (HOSPITAL_COMMUNITY): Payer: Self-pay | Admitting: Psychiatry

## 2017-01-05 ENCOUNTER — Ambulatory Visit (INDEPENDENT_AMBULATORY_CARE_PROVIDER_SITE_OTHER): Payer: Managed Care, Other (non HMO) | Admitting: Psychiatry

## 2017-01-05 DIAGNOSIS — F5081 Binge eating disorder: Secondary | ICD-10-CM

## 2017-01-05 DIAGNOSIS — F4310 Post-traumatic stress disorder, unspecified: Secondary | ICD-10-CM

## 2017-01-05 DIAGNOSIS — F331 Major depressive disorder, recurrent, moderate: Secondary | ICD-10-CM | POA: Diagnosis not present

## 2017-01-05 DIAGNOSIS — F419 Anxiety disorder, unspecified: Secondary | ICD-10-CM | POA: Diagnosis not present

## 2017-01-05 DIAGNOSIS — Z7951 Long term (current) use of inhaled steroids: Secondary | ICD-10-CM

## 2017-01-05 DIAGNOSIS — Z818 Family history of other mental and behavioral disorders: Secondary | ICD-10-CM

## 2017-01-05 DIAGNOSIS — Z79899 Other long term (current) drug therapy: Secondary | ICD-10-CM | POA: Diagnosis not present

## 2017-01-05 DIAGNOSIS — Z811 Family history of alcohol abuse and dependence: Secondary | ICD-10-CM

## 2017-01-05 DIAGNOSIS — F509 Eating disorder, unspecified: Secondary | ICD-10-CM

## 2017-01-05 DIAGNOSIS — Z91018 Allergy to other foods: Secondary | ICD-10-CM

## 2017-01-05 DIAGNOSIS — Z888 Allergy status to other drugs, medicaments and biological substances status: Secondary | ICD-10-CM

## 2017-01-05 DIAGNOSIS — F3181 Bipolar II disorder: Secondary | ICD-10-CM | POA: Diagnosis not present

## 2017-01-05 DIAGNOSIS — F99 Mental disorder, not otherwise specified: Secondary | ICD-10-CM

## 2017-01-05 DIAGNOSIS — F411 Generalized anxiety disorder: Secondary | ICD-10-CM

## 2017-01-05 DIAGNOSIS — G4700 Insomnia, unspecified: Secondary | ICD-10-CM

## 2017-01-05 DIAGNOSIS — F17210 Nicotine dependence, cigarettes, uncomplicated: Secondary | ICD-10-CM

## 2017-01-05 DIAGNOSIS — Z791 Long term (current) use of non-steroidal anti-inflammatories (NSAID): Secondary | ICD-10-CM

## 2017-01-05 DIAGNOSIS — F515 Nightmare disorder: Secondary | ICD-10-CM

## 2017-01-05 DIAGNOSIS — F5105 Insomnia due to other mental disorder: Secondary | ICD-10-CM

## 2017-01-05 DIAGNOSIS — Z880 Allergy status to penicillin: Secondary | ICD-10-CM

## 2017-01-05 MED ORDER — LAMOTRIGINE 25 MG PO TABS: ORAL_TABLET | ORAL | 0 refills | Status: DC

## 2017-01-05 MED ORDER — CITALOPRAM HYDROBROMIDE 40 MG PO TABS: 40.0000 mg | ORAL_TABLET | Freq: Every day | ORAL | 0 refills | Status: DC

## 2017-01-05 MED ORDER — ESZOPICLONE 3 MG PO TABS: 3.0000 mg | ORAL_TABLET | Freq: Every evening | ORAL | 0 refills | Status: DC | PRN

## 2017-01-05 MED ORDER — LURASIDONE HCL 20 MG PO TABS: 20.0000 mg | ORAL_TABLET | Freq: Every day | ORAL | 1 refills | Status: DC

## 2017-01-05 MED ORDER — LISDEXAMFETAMINE DIMESYLATE 20 MG PO CAPS: 20.0000 mg | ORAL_CAPSULE | Freq: Every day | ORAL | 0 refills | Status: DC

## 2017-01-05 MED ORDER — LAMOTRIGINE 100 MG PO TABS: ORAL_TABLET | ORAL | 0 refills | Status: DC

## 2017-01-05 NOTE — Progress Notes (Signed)
BH MD/PA/NP OP Progress Note  01/05/2017 1:06 PM Doreene BurkeBrenda W Bachus  MRN:  [id number]  Chief Complaint:  Chief Complaint    Depression      HPI: Pt states she is sleeping about 2-4 hrs/night due to work stress. She has pulled a lot of "all nighters" and has skipped many doses of Lunesta. Pt states on nights she takes Lunesta she gets about 3-4 hrs. Pt states she is not functioning well. Pt states her psychologists suggested that pt take Lunesta and Xanax. Pt did it twice and states she slept really hard.   Pt is rarely taking Xanax and still has 9/15 tabs left from when it was originally prescribed. Pt took it for the first time on memorial day weekend when she hurt her back. States it didn't help with back pain but did calm her down.  Pt is overwhelmed and stressed with her supervisor at her job. She is having racing thoughts and worry with anxiety. She has daily stress induced panic attacks.   Pt is in counseling with her partner. It is not going well. She is feeling more validated that he is not right for her and is not meeting her needs.   PTSD- she is having nightmares when she sleeps. Pt reports intrusive memories and flashbacks from her childhood trauma.  Depression is getting worse. She is isolating herself. Pt is endorsing frequency crying spells and anhedonia and worthlessness. Pt has on/off SI without plan or intent. The last time was Friday. Pt denies HI.  Taking meds as prescribed and denies SE.   Pt reports she is taking 2 weeks medical leave for her physical health starting on Monday.   Visit Diagnosis:    ICD-10-CM   1. PTSD (post-traumatic stress disorder) F43.10 lurasidone (LATUDA) 20 MG TABS tablet  2. Major depressive disorder, recurrent episode, moderate (HCC) F33.1 lurasidone (LATUDA) 20 MG TABS tablet    citalopram (CELEXA) 40 MG tablet    lamoTRIgine (LAMICTAL) 25 MG tablet    lamoTRIgine (LAMICTAL) 100 MG tablet  3. Anxiety state F41.1 citalopram (CELEXA) 40 MG  tablet  4. Insomnia due to other mental disorder F51.05 Eszopiclone 3 MG TABS   F99   5. Binge eating disorder F50.81 lisdexamfetamine (VYVANSE) 20 MG capsule    lisdexamfetamine (VYVANSE) 20 MG capsule       Past Psychiatric History:  Anxiety: yes Bipolar Disorder: No Depression: Yes Mania: No  Psychosis: No  Schizophrenia: No  Personality Disorder: No  Hospitalization for psychiatric illness: Yes  History of Electroconvulsive Shock Therapy: No  Prior Suicide Attempts: No  Past Medical History:  Past Medical History:  Diagnosis Date  . Anemia 15 YRS AGO, NONE RECENT  . Arthritis    LOWER BACK AND HIPS  . Broken foot    BOTH FEET LEFT FOOT CAST  . Complication of anesthesia    Epinephrine"tachycardia"-dentist  . Concussion with brief LOC   . COPD (chronic obstructive pulmonary disease) (HCC)    MILD  . Depression   . GERD (gastroesophageal reflux disease)   . Headache(784.0)    migraines  . Hip pain   . History of hiatal hernia   . History of kidney stones    passed  . 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 Fears memory loss. Questions  About  Recent memory, loss related [place].   . Hypothyroidism   . Memory loss HX OF  WITH CONCUSSION  . Menopause   . Ovarian cyst   . TIA (transient ischemic attack)    SUSPECTED ABOUT 11 YEARS AGO    Past Surgical History:  Procedure Laterality Date  . ABDOMINAL SURGERY     x2(laparoscopic, 1 open), NO FALLOPIAN TUBES REMOVED WITH SURGERY  . APPENDECTOMY    . CESAREAN SECTION     X 1  . COLON SURGERY  1986   "blockage"colectomy  . COLONOSCOPY WITH PROPOFOL N/A 10/01/2013   Procedure: COLONOSCOPY WITH PROPOFOL;  Surgeon: Charolett Bumpers, MD;  Location: [facility];  Service: Endoscopy;  Laterality: N/A;  . ESOPHAGOGASTRODUODENOSCOPY (EGD) WITH PROPOFOL N/A 09/26/2016   Procedure: ESOPHAGOGASTRODUODENOSCOPY (EGD) WITH  PROPOFOL;  Surgeon: Charolett Bumpers, MD;  Location: [facility];  Service: Endoscopy;  Laterality: N/A;  . HEMORROIDECTOMY    . HEMORROIDECTOMY     4x- last in Dec 2016  . MOUTH SURGERY  December 19, 2013   implant  . ORIF TOE FRACTURE Right 06/19/2014   Procedure: RIGHT FOOT OPEN REDUCTION INTERNAL FIXATION (ORIF) FIFTH METATARSAL ;  Surgeon: Loreta Ave, MD;  Location: [facility];  Service: Orthopedics;  Laterality: Right;    Family Psychiatric History:  Family History  Problem Relation Age of Onset  . Heart disease Maternal Grandfather   . Heart disease Maternal Grandmother   . Lung cancer Maternal Grandmother   . Anxiety disorder Sister   . Depression Sister   . Alcohol abuse Sister   . Anxiety disorder Cousin   . Alcohol abuse Cousin   . Depression Cousin        committed suicide  . Suicidality Cousin     Social History:  Social History   Social History  . Marital status: Single    Spouse name: N/A  . Number of children: 1  . Years of education: College   Occupational History  .  [employer]   Social History Main Topics  . Smoking status: Current Some Day Smoker    Packs/day: 0.20    Years: 33.00    Types: Cigarettes  . Smokeless tobacco: Never Used     Comment: 1 pk per week-social  . Alcohol use 0.0 oz/week     Comment: social- on weekends a couple of glasses of wine  . Drug use: No  . Sexual activity: Yes   Other Topics Concern  . Not on file   Social History Narrative   Patient lives with her fianceeLaure Kidney) and her son.   Patient has one child.   Patient has college education.   Patient works at [employer] (full-time).   Patient is right-handed.   Patient drinks four cups of caffeine per day.    Allergies:  Allergies  Allergen Reactions  . Penicillins Anaphylaxis    Has patient had a PCN reaction causing immediate rash, facial/tongue/throat swelling, SOB or lightheadedness with hypotension: Yes Has patient had  a PCN reaction causing severe rash involving mucus membranes or skin necrosis: No Has patient had a PCN reaction that required hospitalization: No Has patient had a PCN reaction occurring within the last 10 years: No If all of the above answers are "NO", then may proceed with Cephalosporin use.   Izola Price Other (See Comments)    Headache  . Chocolate Other (See Comments)    Headache   . Other Other (See Comments)    MSG-Headache  . Prednisone Swelling    NAUSEA, STOMACH ISSUES  . Epinephrine  Palpitations    Heart racing    Metabolic Disorder Labs: No results found for: HGBA1C, MPG No results found for: PROLACTIN No results found for: CHOL, TRIG, HDL, CHOLHDL, VLDL, LDLCALC   Current Medications: Current Outpatient Prescriptions  Medication Sig Dispense Refill  . ALPRAZolam (XANAX) 0.5 MG tablet Take 1 tablet (0.5 mg total) by mouth at bedtime as needed for anxiety. 15 tablet 0  . citalopram (CELEXA) 40 MG tablet Take 1 tablet (40 mg total) by mouth daily. (Patient taking differently: Take 40 mg by mouth every evening. ) 90 tablet 0  . eszopiclone (LUNESTA) 2 MG TABS tablet Take 1 tablet (2 mg total) by mouth at bedtime as needed for sleep. Take immediately before bedtime 90 tablet 0  . ibuprofen (ADVIL,MOTRIN) 200 MG tablet Take 400 mg by mouth daily as needed for mild pain.     Marland Kitchen lamoTRIgine (LAMICTAL) 100 MG tablet Take 1 a day for total of 125mg  (with 25 mg) po qD (Patient taking differently: Take 100 mg by mouth every evening. Takes a total of 125mg  daily) 90 tablet 0  . lamoTRIgine (LAMICTAL) 25 MG tablet Take 1 a day for total of 125mg  (with 100 mg) po qD (Patient taking differently: Take 25 mg by mouth every evening. Takes a total of 125mg  daily) 90 tablet 0  . levothyroxine (SYNTHROID, LEVOTHROID) 88 MCG tablet Take 88 mcg by mouth every evening.     . lisdexamfetamine (VYVANSE) 20 MG capsule Take 1 capsule (20 mg total) by mouth daily. 30 capsule 0  . meloxicam (MOBIC) 15  MG tablet Take 15 mg by mouth daily as needed for pain.     Marland Kitchen norethindrone-ethinyl estradiol (JINTELI) 1-5 MG-MCG TABS tablet Take 1 tablet by mouth every evening.    . pantoprazole (PROTONIX) 40 MG tablet Take 40 mg by mouth every evening.     . umeclidinium-vilanterol (ANORO ELLIPTA) 62.5-25 MCG/INH AEPB Inhale 1 puff into the lungs daily.     No current facility-administered medications for this visit.     Musculoskeletal: Strength & Muscle Tone: within normal limits Gait & Station: normal Patient leans: N/A  Psychiatric Specialty Exam: Review of Systems  HENT: Positive for congestion. Negative for nosebleeds, sinus pain and sore throat.   Gastrointestinal: Positive for heartburn and vomiting. Negative for constipation, diarrhea and nausea.  Neurological: Positive for headaches. Negative for dizziness, sensory change and speech change.  Psychiatric/Behavioral: Positive for depression. Negative for hallucinations, substance abuse and suicidal ideas. The patient is nervous/anxious and has insomnia.     Last menstrual period 06/03/2014.There is no height or weight on file to calculate BMI.  General Appearance: Casual  Eye Contact:  Good  Speech:  Clear and Coherent and Normal Rate  Volume:  Normal  Mood:  Anxious and Depressed  Affect:  Congruent, Depressed and Tearful  Thought Process:  Coherent and Descriptions of Associations: Circumstantial  Orientation:  Full (Time, Place, and Person)  Thought Content: Rumination   Suicidal Thoughts:  No  Homicidal Thoughts:  No  Memory:  Immediate;   Good Recent;   Good Remote;   Good  Judgement:  Good  Insight:  Good  Psychomotor Activity:  Normal  Concentration:  Concentration: Good and Attention Span: Good  Recall:  Good  Fund of Knowledge: Good  Language: Good  Akathisia:  No  Handed:  Right  AIMS (if indicated):  n/a  Assets:  Communication Skills Desire for Improvement Housing Social  Support Talents/Skills Transportation Vocational/Educational  ADL's:  Intact  Cognition: WNL  Sleep:  poor     Treatment Plan Summary:Medication management   Assessment: MDD-recurrent, moderate vs Bipolar II disorder; Insomnia; Eating disorder NOS; Anxiety state   Medication management with supportive therapy. Risks/benefits and SE of the medication discussed. Pt verbalized understanding and verbal consent obtained for treatment.  Affirm with the patient that the medications are taken as ordered. Patient expressed understanding of how their medications were to be used.   Meds: Celexa 40mg  po qD for mood Lamictal 125mg  po qD for mood issues. Increase Lunesta 3mg  po qHS prn insomnia Vyvanse 20mg  po qD for BED Xanax 0.5mg  po qD prn anxiety- given 15 tabs Start trial of Latuda 20mg  po q  Labs: none   Therapy: brief supportive therapy provided. Discussed psychosocial stressors in detail.   Recent Psychological testing show deficits in long term and short term memory and immediate recall.   Consultations: encouraged to continue therapy with Mickey Dew  Pt denies SI and is at an acute low risk for suicide. Patient told to call clinic if any problems occur. Patient advised to go to ER if they should develop SI/HI, side effects, or if symptoms worsen. Has crisis numbers to call if needed. Pt verbalized understanding.  F/up in 6 weeks or sooner if needed  Oletta Darter, MD 01/05/2017, 1:06 PM

## 2017-01-12 ENCOUNTER — Ambulatory Visit (INDEPENDENT_AMBULATORY_CARE_PROVIDER_SITE_OTHER): Payer: Managed Care, Other (non HMO) | Admitting: Psychiatry

## 2017-01-12 ENCOUNTER — Encounter (HOSPITAL_COMMUNITY): Payer: Self-pay | Admitting: Psychiatry

## 2017-01-12 VITALS — BP 106/72 | HR 78 | Ht 64.0 in | Wt 115.0 lb

## 2017-01-12 DIAGNOSIS — F332 Major depressive disorder, recurrent severe without psychotic features: Secondary | ICD-10-CM

## 2017-01-12 DIAGNOSIS — G4700 Insomnia, unspecified: Secondary | ICD-10-CM | POA: Diagnosis not present

## 2017-01-12 DIAGNOSIS — F419 Anxiety disorder, unspecified: Secondary | ICD-10-CM

## 2017-01-12 DIAGNOSIS — F3181 Bipolar II disorder: Secondary | ICD-10-CM | POA: Diagnosis not present

## 2017-01-12 DIAGNOSIS — Z888 Allergy status to other drugs, medicaments and biological substances status: Secondary | ICD-10-CM | POA: Diagnosis not present

## 2017-01-12 DIAGNOSIS — Z791 Long term (current) use of non-steroidal anti-inflammatories (NSAID): Secondary | ICD-10-CM | POA: Diagnosis not present

## 2017-01-12 DIAGNOSIS — Z79899 Other long term (current) drug therapy: Secondary | ICD-10-CM

## 2017-01-12 DIAGNOSIS — Z880 Allergy status to penicillin: Secondary | ICD-10-CM

## 2017-01-12 DIAGNOSIS — Z818 Family history of other mental and behavioral disorders: Secondary | ICD-10-CM

## 2017-01-12 DIAGNOSIS — F17210 Nicotine dependence, cigarettes, uncomplicated: Secondary | ICD-10-CM | POA: Diagnosis not present

## 2017-01-12 DIAGNOSIS — Z811 Family history of alcohol abuse and dependence: Secondary | ICD-10-CM | POA: Diagnosis not present

## 2017-01-12 DIAGNOSIS — F502 Bulimia nervosa: Secondary | ICD-10-CM | POA: Diagnosis not present

## 2017-01-12 NOTE — Progress Notes (Signed)
BH MD/PA/NP OP Progress Note  01/12/2017 4:02 PM Monica Jensen  MRN:  [id number]  Chief Complaint:  Chief Complaint    Follow-up      HPI: Pt here for one week follow up.  Pt has been tolerating the Latuda. She has been sleeping a lot but she doesn't know if it's because she on medical leave or because of medication side effect.  She is still struggling with food. She is eating about one meal a day. Some days she eats more and then forces herself to throw up.  No change in depression symptoms as of yet. Being out of work has helped to decrease her stress. Pt states SI has resolved.  Pt states she needs to make a lot of decisions and doesn't know how to.  Pt has weekly therapy session now.  Taking meds as prescribed and denies SE.    Visit Diagnosis:    ICD-10-CM   1. Severe episode of recurrent major depressive disorder, without psychotic features (HCC) F33.2   2. Bulimia F50.2        Past Psychiatric History:  Anxiety: yes Bipolar Disorder: No Depression: Yes Mania: No  Psychosis: No  Schizophrenia: No  Personality Disorder: No  Hospitalization for psychiatric illness: Yes  History of Electroconvulsive Shock Therapy: No  Prior Suicide Attempts: No  Past Medical History:  Past Medical History:  Diagnosis Date  . Anemia 15 YRS AGO, NONE RECENT  . Arthritis    LOWER BACK AND HIPS  . Broken foot    BOTH FEET LEFT FOOT CAST  . Complication of anesthesia    Epinephrine"tachycardia"-dentist  . Concussion with brief LOC   . COPD (chronic obstructive pulmonary disease) (HCC)    MILD  . Depression   . GERD (gastroesophageal reflux disease)   . Headache(784.0)    migraines  . Hip pain   . History of hiatal hernia   . History of kidney stones    passed  . 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 Fears memory loss. Questions  About  Recent memory, loss related to  [place].   . Hypothyroidism   . Memory loss HX OF WITH CONCUSSION  . Menopause   . Ovarian cyst   . TIA (transient ischemic attack)    SUSPECTED ABOUT 11 YEARS AGO    Past Surgical History:  Procedure Laterality Date  . ABDOMINAL SURGERY     x2(laparoscopic, 1 open), NO FALLOPIAN TUBES REMOVED WITH SURGERY  . APPENDECTOMY    . CESAREAN SECTION     X 1  . COLON SURGERY  1986   "blockage"colectomy  . COLONOSCOPY WITH PROPOFOL N/A 10/01/2013   Procedure: COLONOSCOPY WITH PROPOFOL;  Surgeon: Charolett Bumpers, MD;  Location: [facility];  Service: Endoscopy;  Laterality: N/A;  . ESOPHAGOGASTRODUODENOSCOPY (EGD) WITH PROPOFOL N/A 09/26/2016   Procedure: ESOPHAGOGASTRODUODENOSCOPY (EGD) WITH PROPOFOL;  Surgeon: Charolett Bumpers, MD;  Location: [facility];  Service: Endoscopy;  Laterality: N/A;  . HEMORROIDECTOMY    . HEMORROIDECTOMY     4x- last in Dec 2016  . MOUTH SURGERY  December 19, 2013   implant  . ORIF TOE FRACTURE Right 06/19/2014   Procedure: RIGHT FOOT OPEN REDUCTION INTERNAL FIXATION (ORIF) FIFTH METATARSAL ;  Surgeon: Loreta Ave, MD;  Location: [facility];  Service: Orthopedics;  Laterality: Right;    Family Psychiatric History:  Family History  Problem Relation  Age of Onset  . Heart disease Maternal Grandfather   . Heart disease Maternal Grandmother   . Lung cancer Maternal Grandmother   . Anxiety disorder Sister   . Depression Sister   . Alcohol abuse Sister   . Anxiety disorder Cousin   . Alcohol abuse Cousin   . Depression Cousin        committed suicide  . Suicidality Cousin     Social History:  Social History   Social History  . Marital status: Single    Spouse name: N/A  . Number of children: 1  . Years of education: College   Occupational History  .  [employer]   Social History Main Topics  . Smoking status: Current Some Day Smoker    Packs/day: 0.20    Years: 33.00    Types: Cigarettes  . Smokeless tobacco: Never Used      Comment: Has cut back to 2-3 a day and trying to quit  . Alcohol use 1.2 oz/week    1 Glasses of wine, 1 Cans of beer per week     Comment: social- on weekends a couple of glasses of wine  . Drug use: No  . Sexual activity: Not Currently   Other Topics Concern  . None   Social History Narrative   Patient lives with her [name] and her son.   Patient has one child.   Patient has college education.   Patient works at [employer] (full-time).   Patient is right-handed.   Patient drinks four cups of caffeine per day.    Allergies:  Allergies  Allergen Reactions  . Penicillins Anaphylaxis    Has patient had a PCN reaction causing immediate rash, facial/tongue/throat swelling, SOB or lightheadedness with hypotension: Yes Has patient had a PCN reaction causing severe rash involving mucus membranes or skin necrosis: No Has patient had a PCN reaction that required hospitalization: No Has patient had a PCN reaction occurring within the last 10 years: No If all of the above answers are "NO", then may proceed with Cephalosporin use.   Izola Price. Cheese Other (See Comments)    Headache  . Chocolate Other (See Comments)    Headache   . Other Other (See Comments)    MSG-Headache  . Prednisone Swelling    NAUSEA, STOMACH ISSUES  . Epinephrine Palpitations    Heart racing    Metabolic Disorder Labs: No results found for: HGBA1C, MPG No results found for: PROLACTIN No results found for: CHOL, TRIG, HDL, CHOLHDL, VLDL, LDLCALC   Current Medications: Current Outpatient Prescriptions  Medication Sig Dispense Refill  . ALPRAZolam (XANAX) 0.5 MG tablet Take 1 tablet (0.5 mg total) by mouth at bedtime as needed for anxiety. 15 tablet 0  . citalopram (CELEXA) 40 MG tablet Take 1 tablet (40 mg total) by mouth daily. 90 tablet 0  . Eszopiclone 3 MG TABS Take 1 tablet (3 mg total) by mouth at bedtime as needed for sleep. Take immediately before bedtime 90 tablet 0  . ibuprofen  (ADVIL,MOTRIN) 200 MG tablet Take 400 mg by mouth daily as needed for mild pain.     Marland Kitchen. lamoTRIgine (LAMICTAL) 100 MG tablet Take 1 a day for total of 125mg  (with 25 mg) po qD 90 tablet 0  . lamoTRIgine (LAMICTAL) 25 MG tablet Take 1 a day for total of 125mg  (with 100 mg) po qD 90 tablet 0  . levothyroxine (SYNTHROID, LEVOTHROID) 88 MCG tablet Take 88 mcg by mouth  every evening.     . lisdexamfetamine (VYVANSE) 20 MG capsule Take 1 capsule (20 mg total) by mouth daily. 30 capsule 0  . lisdexamfetamine (VYVANSE) 20 MG capsule Take 1 capsule (20 mg total) by mouth daily. 30 capsule 0  . lurasidone (LATUDA) 20 MG TABS tablet Take 1 tablet (20 mg total) by mouth daily. 30 tablet 1  . meloxicam (MOBIC) 15 MG tablet Take 15 mg by mouth daily as needed for pain.     Marland Kitchen norethindrone-ethinyl estradiol (JINTELI) 1-5 MG-MCG TABS tablet Take 1 tablet by mouth every evening.    . pantoprazole (PROTONIX) 40 MG tablet Take 40 mg by mouth every evening.     . umeclidinium-vilanterol (ANORO ELLIPTA) 62.5-25 MCG/INH AEPB Inhale 1 puff into the lungs daily.     No current facility-administered medications for this visit.     Musculoskeletal: Strength & Muscle Tone: within normal limits Gait & Station: normal Patient leans: N/A  Psychiatric Specialty Exam: Review of Systems  Skin: Positive for itching and rash.  Neurological: Negative for dizziness, tingling, tremors, sensory change and headaches.  Psychiatric/Behavioral: Positive for depression. Negative for hallucinations, substance abuse and suicidal ideas. The patient is nervous/anxious. The patient does not have insomnia.     Blood pressure 106/72, pulse 78, height 5\' 4"  (1.626 m), weight 115 lb (52.2 kg), last menstrual period 06/03/2014, SpO2 99 %.Body mass index is 19.74 kg/m.  General Appearance: Casual  Eye Contact:  Good  Speech:  Clear and Coherent and Normal Rate  Volume:  Normal  Mood:  Anxious and Depressed  Affect:  Congruent, Depressed  and Tearful  Thought Process:  Goal Directed and Descriptions of Associations: Intact  Orientation:  Full (Time, Place, and Person)  Thought Content: Logical   Suicidal Thoughts:  No  Homicidal Thoughts:  No  Memory:  Immediate;   Fair Recent;   Fair Remote;   Fair  Judgement:  Fair  Insight:  Fair  Psychomotor Activity:  Normal  Concentration:  Concentration: Fair  Recall:  [employer] of Knowledge: Fair  Language: Fair  Akathisia:  No  Handed:  Right  AIMS (if indicated):  n/a  Assets:  Communication Skills Desire for Improvement Financial Resources/Insurance Housing Social Support Talents/Skills Transportation Vocational/Educational  ADL's:  Intact  Cognition: WNL  Sleep:  good     Treatment Plan Summary:Medication management   Assessment: MDD-recurrent, moderate vs Bipolar II disorder; Insomnia; Bulimia; Anxiety state   Medication management with supportive therapy. Risks/benefits and SE of the medication discussed. Pt verbalized understanding and verbal consent obtained for treatment.  Affirm with the patient that the medications are taken as ordered. Patient expressed understanding of how their medications were to be used.   Meds: Celexa 40mg  po qD for mood Lamictal 125mg  po qD for mood issues. Increase Lunesta 3mg  po qHS prn insomnia Vyvanse 20mg  po qD for BED Xanax 0.5mg  po qD prn anxiety- given 15 tabs Latuda 20mg  po qD for mood   Labs: none  Therapy: brief supportive therapy provided. Discussed psychosocial stressors in detail.     Consultations: encouraged to follow up with therapist  Pt denies SI and is at an acute low risk for suicide. Patient told to call clinic if any problems occur. Patient advised to go to ER if they should develop SI/HI, side effects, or if symptoms worsen. Has crisis numbers to call if needed. Pt verbalized understanding.  F/up in 3 months or sooner if needed   Oletta Darter, MD 01/12/2017, 4:02  PM

## 2017-01-23 NOTE — Addendum Note (Signed)
Addendum  created 01/23/17 1729 by Jhanvi Drakeford, MD   Sign clinical note    

## 2017-03-14 ENCOUNTER — Other Ambulatory Visit: Payer: Self-pay | Admitting: Internal Medicine

## 2017-03-14 ENCOUNTER — Ambulatory Visit
Admission: RE | Admit: 2017-03-14 | Discharge: 2017-03-14 | Disposition: A | Discharge status: Home / Self Care | Payer: Managed Care, Other (non HMO) | Source: Ambulatory Visit | Attending: Internal Medicine | Admitting: Internal Medicine

## 2017-03-14 DIAGNOSIS — M542 Cervicalgia: Secondary | ICD-10-CM

## 2017-03-16 ENCOUNTER — Ambulatory Visit (HOSPITAL_COMMUNITY): Payer: Managed Care, Other (non HMO) | Admitting: Psychiatry

## 2017-04-26 ENCOUNTER — Other Ambulatory Visit (HOSPITAL_COMMUNITY): Payer: Self-pay

## 2017-04-26 DIAGNOSIS — F4310 Post-traumatic stress disorder, unspecified: Secondary | ICD-10-CM

## 2017-04-26 DIAGNOSIS — F331 Major depressive disorder, recurrent, moderate: Secondary | ICD-10-CM

## 2017-04-26 MED ORDER — LURASIDONE HCL 20 MG PO TABS: 20.0000 mg | ORAL_TABLET | Freq: Every day | ORAL | 0 refills | Status: DC

## 2017-05-08 ENCOUNTER — Telehealth (HOSPITAL_COMMUNITY): Payer: Self-pay

## 2017-05-08 DIAGNOSIS — F5081 Binge eating disorder: Secondary | ICD-10-CM

## 2017-05-08 NOTE — Telephone Encounter (Signed)
Patient is calling for a refill on her Vyvanse. Please review and advise, thank you

## 2017-05-11 MED ORDER — LISDEXAMFETAMINE DIMESYLATE 20 MG PO CAPS: 20.0000 mg | ORAL_CAPSULE | Freq: Every day | ORAL | 0 refills | Status: DC

## 2017-05-11 NOTE — Telephone Encounter (Signed)
Crystal, could you please print this medication for the doctor to sign and call patient to come and pick up? Thank you

## 2017-05-11 NOTE — Telephone Encounter (Signed)
Yes refill

## 2017-05-11 NOTE — Telephone Encounter (Signed)
Printed RX and had doctor to sign it. Called and left VM informing patient that she can pick the RX up.

## 2017-05-25 ENCOUNTER — Ambulatory Visit (HOSPITAL_COMMUNITY): Payer: 59 | Admitting: Psychiatry

## 2017-05-25 ENCOUNTER — Encounter (HOSPITAL_COMMUNITY): Payer: Self-pay | Admitting: Psychiatry

## 2017-05-25 DIAGNOSIS — Z818 Family history of other mental and behavioral disorders: Secondary | ICD-10-CM | POA: Diagnosis not present

## 2017-05-25 DIAGNOSIS — F515 Nightmare disorder: Secondary | ICD-10-CM

## 2017-05-25 DIAGNOSIS — F50819 Binge eating disorder, unspecified: Secondary | ICD-10-CM

## 2017-05-25 DIAGNOSIS — F5081 Binge eating disorder: Secondary | ICD-10-CM | POA: Diagnosis not present

## 2017-05-25 DIAGNOSIS — F99 Mental disorder, not otherwise specified: Secondary | ICD-10-CM

## 2017-05-25 DIAGNOSIS — Z811 Family history of alcohol abuse and dependence: Secondary | ICD-10-CM

## 2017-05-25 DIAGNOSIS — F411 Generalized anxiety disorder: Secondary | ICD-10-CM | POA: Diagnosis not present

## 2017-05-25 DIAGNOSIS — F331 Major depressive disorder, recurrent, moderate: Secondary | ICD-10-CM | POA: Diagnosis not present

## 2017-05-25 DIAGNOSIS — F4310 Post-traumatic stress disorder, unspecified: Secondary | ICD-10-CM | POA: Diagnosis not present

## 2017-05-25 DIAGNOSIS — R4583 Excessive crying of child, adolescent or adult: Secondary | ICD-10-CM

## 2017-05-25 DIAGNOSIS — F5105 Insomnia due to other mental disorder: Secondary | ICD-10-CM | POA: Diagnosis not present

## 2017-05-25 DIAGNOSIS — F17210 Nicotine dependence, cigarettes, uncomplicated: Secondary | ICD-10-CM

## 2017-05-25 MED ORDER — LAMOTRIGINE 25 MG PO TABS: ORAL_TABLET | ORAL | 0 refills | Status: DC

## 2017-05-25 MED ORDER — ESZOPICLONE 3 MG PO TABS: 3.0000 mg | ORAL_TABLET | Freq: Every evening | ORAL | 0 refills | Status: DC | PRN

## 2017-05-25 MED ORDER — LISDEXAMFETAMINE DIMESYLATE 20 MG PO CAPS: 20.0000 mg | ORAL_CAPSULE | Freq: Every day | ORAL | 0 refills | Status: DC

## 2017-05-25 MED ORDER — LURASIDONE HCL 20 MG PO TABS: 20.0000 mg | ORAL_TABLET | Freq: Every day | ORAL | 0 refills | Status: DC

## 2017-05-25 MED ORDER — CITALOPRAM HYDROBROMIDE 40 MG PO TABS: 40.0000 mg | ORAL_TABLET | Freq: Every day | ORAL | 0 refills | Status: DC

## 2017-05-25 MED ORDER — LAMOTRIGINE 100 MG PO TABS: ORAL_TABLET | ORAL | 0 refills | Status: DC

## 2017-05-25 NOTE — Progress Notes (Signed)
BH MD/PA/NP OP Progress Note  05/25/2017 1:43 PM Monica Jensen  MRN:  [id number]  Chief Complaint:  Chief Complaint    Follow-up     HPI:  "I'm doing well suprrising well considering my son had to be admitted to a psych unit in October". She was extremely stressed by his mental and physical health. During this times she was having nightmares, crying spells and anxiety. He was discharged and spend a week with her and then went to his dad's. The day he left for his dad's she had a "breakdown" at being separated from him. Pt has depression but she is tolerating it.  Pt denies SI/HI.  Pt took Jordan initially but stopped after a few days due to stomach issues. She started taking it regularly about 1 month ago.   Pt denies manic and hypomanic symptoms including periods of decreased need for sleep, increased energy, mood lability, impulsivity, FOI, and excessive spending.  Sleep is poor. She is getting about 4 hrs most nights even with Lunesta 2mg . She lost the script for the 3mg  tabs.  With all the stress she has been having she has binging several times a week and she vomits each time. It causes her to feel numb.  Pt has been able to go for a run which used to help her avoid binging episodes.  Visit Diagnosis:    ICD-10-CM   1. Anxiety state F41.1 citalopram (CELEXA) 40 MG tablet  2. Major depressive disorder, recurrent episode, moderate (HCC) F33.1 citalopram (CELEXA) 40 MG tablet    lamoTRIgine (LAMICTAL) 100 MG tablet    lamoTRIgine (LAMICTAL) 25 MG tablet    lurasidone (LATUDA) 20 MG TABS tablet  3. Insomnia due to other mental disorder F51.05 Eszopiclone 3 MG TABS   F99   4. Binge eating disorder F50.81 lisdexamfetamine (VYVANSE) 20 MG capsule    lisdexamfetamine (VYVANSE) 20 MG capsule  5. PTSD (post-traumatic stress disorder) F43.10 lurasidone (LATUDA) 20 MG TABS tablet      Past Psychiatric History:  Anxiety: yes Bipolar Disorder: No Depression: Yes Mania: No   Psychosis: No  Schizophrenia: No  Personality Disorder: No  Hospitalization for psychiatric illness: Yes  History of Electroconvulsive Shock Therapy: No  Prior Suicide Attempts: No   Past Medical History:  Past Medical History:  Diagnosis Date  . Anemia 15 YRS AGO, NONE RECENT  . Arthritis    LOWER BACK AND HIPS  . Broken foot    BOTH FEET LEFT FOOT CAST  . Complication of anesthesia    Epinephrine"tachycardia"-dentist  . Concussion with brief LOC   . COPD (chronic obstructive pulmonary disease) (HCC)    MILD  . Depression   . GERD (gastroesophageal reflux disease)   . Headache(784.0)    migraines  . Hip pain   . History of hiatal hernia   . History of kidney stones    passed  . 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 Fears memory loss. Questions  About  Recent memory, loss related to [place].   . Hypothyroidism   . Memory loss HX OF WITH CONCUSSION  . Menopause   . Ovarian cyst   . TIA (transient ischemic attack)    SUSPECTED ABOUT 11 YEARS AGO    Past Surgical History:  Procedure Laterality Date  . ABDOMINAL SURGERY     x2(laparoscopic, 1 open), NO FALLOPIAN TUBES REMOVED WITH SURGERY  . APPENDECTOMY    .  CESAREAN SECTION     X 1  . COLON SURGERY  1986   "blockage"colectomy  . COLONOSCOPY WITH PROPOFOL N/A 10/01/2013   Procedure: COLONOSCOPY WITH PROPOFOL;  Surgeon: Charolett BumpersMartin K Johnson, MD;  Location: [facility];  Service: Endoscopy;  Laterality: N/A;  . ESOPHAGOGASTRODUODENOSCOPY (EGD) WITH PROPOFOL N/A 09/26/2016   Procedure: ESOPHAGOGASTRODUODENOSCOPY (EGD) WITH PROPOFOL;  Surgeon: Charolett BumpersMartin K Johnson, MD;  Location: [facility];  Service: Endoscopy;  Laterality: N/A;  . HEMORROIDECTOMY    . HEMORROIDECTOMY     4x- last in Dec 2016  . MOUTH SURGERY  December 19, 2013   implant  . ORIF TOE FRACTURE Right 06/19/2014   Procedure: RIGHT FOOT OPEN REDUCTION INTERNAL FIXATION  (ORIF) FIFTH METATARSAL ;  Surgeon: Loreta Aveaniel F Murphy, MD;  Location: [facility];  Service: Orthopedics;  Laterality: Right;    Family Psychiatric History:  Family History  Problem Relation Age of Onset  . Heart disease Maternal Grandfather   . Heart disease Maternal Grandmother   . Lung cancer Maternal Grandmother   . Anxiety disorder Sister   . Depression Sister   . Alcohol abuse Sister   . Anxiety disorder Cousin   . Alcohol abuse Cousin   . Depression Cousin        committed suicide  . Suicidality Cousin     Social History:  Social History   Socioeconomic History  . Marital status: Single    Spouse name: None  . Number of children: 1  . Years of education: College  . Highest education level: None  Social Needs  . Financial resource strain: None  . Food insecurity - worry: None  . Food insecurity - inability: None  . Transportation needs - medical: None  . Transportation needs - non-medical: None  Occupational History    Employer: [employer]  Tobacco Use  . Smoking status: Current Some Day Smoker    Packs/day: 0.20    Years: 33.00    Pack years: 6.60    Types: Cigarettes  . Smokeless tobacco: Never Used  . Tobacco comment: Has cut back to 2-3 a day and trying to quit  Substance and Sexual Activity  . Alcohol use: Yes    Alcohol/week: 1.2 oz    Types: 1 Glasses of wine, 1 Cans of beer per week    Comment: social- on weekends a couple of glasses of wine  . Drug use: No  . Sexual activity: Not Currently  Other Topics Concern  . None  Social History Narrative   Patient lives with her [name] and her son.   Patient has one child.   Patient has college education.   Patient works at [employer] (full-time).   Patient is right-handed.   Patient drinks four cups of caffeine per day.    Allergies:  Allergies  Allergen Reactions  . Penicillins Anaphylaxis    Has patient had a PCN reaction causing immediate rash,  facial/tongue/throat swelling, SOB or lightheadedness with hypotension: Yes Has patient had a PCN reaction causing severe rash involving mucus membranes or skin necrosis: No Has patient had a PCN reaction that required hospitalization: No Has patient had a PCN reaction occurring within the last 10 years: No If all of the above answers are "NO", then may proceed with Cephalosporin use.   Izola Price. Cheese Other (See Comments)    Headache  . Chocolate Other (See Comments)    Headache   . Other Other (See Comments)    MSG-Headache  .  Prednisone Swelling    NAUSEA, STOMACH ISSUES  . Epinephrine Palpitations    Heart racing    Metabolic Disorder Labs: No results found for: HGBA1C, MPG No results found for: PROLACTIN No results found for: CHOL, TRIG, HDL, CHOLHDL, VLDL, LDLCALC No results found for: TSH  Therapeutic Level Labs: No results found for: LITHIUM No results found for: VALPROATE No components found for:  CBMZ  Current Medications: Current Outpatient Medications  Medication Sig Dispense Refill  . ALPRAZolam (XANAX) 0.5 MG tablet Take 1 tablet (0.5 mg total) by mouth at bedtime as needed for anxiety. 15 tablet 0  . citalopram (CELEXA) 40 MG tablet Take 1 tablet (40 mg total) by mouth daily. 90 tablet 0  . Eszopiclone 3 MG TABS Take 1 tablet (3 mg total) by mouth at bedtime as needed for sleep. Take immediately before bedtime (Patient taking differently: Take 2 mg at bedtime as needed by mouth for sleep. Take immediately before bedtime) 90 tablet 0  . ibuprofen (ADVIL,MOTRIN) 200 MG tablet Take 400 mg by mouth daily as needed for mild pain.     Marland Kitchen lamoTRIgine (LAMICTAL) 100 MG tablet Take 1 a day for total of 125mg  (with 25 mg) po qD 90 tablet 0  . lamoTRIgine (LAMICTAL) 25 MG tablet Take 1 a day for total of 125mg  (with 100 mg) po qD 90 tablet 0  . levothyroxine (SYNTHROID, LEVOTHROID) 88 MCG tablet Take 88 mcg by mouth every evening.     . lisdexamfetamine (VYVANSE) 20 MG capsule  Take 1 capsule (20 mg total) by mouth daily. 30 capsule 0  . lisdexamfetamine (VYVANSE) 20 MG capsule Take 1 capsule (20 mg total) by mouth daily. 30 capsule 0  . lurasidone (LATUDA) 20 MG TABS tablet Take 1 tablet (20 mg total) by mouth daily. 30 tablet 0  . meloxicam (MOBIC) 15 MG tablet Take 15 mg by mouth daily as needed for pain.     Marland Kitchen norethindrone-ethinyl estradiol (JINTELI) 1-5 MG-MCG TABS tablet Take 1 tablet by mouth every evening.    . pantoprazole (PROTONIX) 40 MG tablet Take 40 mg by mouth every evening.     . umeclidinium-vilanterol (ANORO ELLIPTA) 62.5-25 MCG/INH AEPB Inhale 1 puff into the lungs daily.     No current facility-administered medications for this visit.      Musculoskeletal: Strength & Muscle Tone: within normal limits Gait & Station: normal Patient leans: N/A  Psychiatric Specialty Exam: Review of Systems  Constitutional: Negative for chills and fever.  HENT: Negative for congestion, nosebleeds, sinus pain and sore throat.   Neurological: Negative for weakness.    Blood pressure 110/82, pulse 80, resp. rate 12, height 5\' 4"  (1.626 m), weight 116 lb 3.2 oz (52.7 kg), last menstrual period 06/03/2014.Body mass index is 19.95 kg/m.  General Appearance: Casual  Eye Contact:  Good  Speech:  Clear and Coherent and Normal Rate  Volume:  Normal  Mood:  Anxious and Depressed  Affect:  Full Range  Thought Process:  Goal Directed and Descriptions of Associations: Intact  Orientation:  Full (Time, Place, and Person)  Thought Content: Logical   Suicidal Thoughts:  No  Homicidal Thoughts:  No  Memory:  Immediate;   Good Recent;   Good Remote;   Good  Judgement:  Good  Insight:  Good  Psychomotor Activity:  Normal  Concentration:  Concentration: Good and Attention Span: Good  Recall:  Good  Fund of Knowledge: Good  Language: Good  Akathisia:  No  Handed:  Right  AIMS (if indicated): not done  Assets:  Communication Skills Desire for  Improvement Financial Resources/Insurance Housing Transportation Vocational/Educational  ADL's:  Intact  Cognition: WNL  Sleep:  Poor   Screenings:   Assessment and Plan: MDD-recurrent, moderate vs Bipolar II disorder; Insomnia; Bulimia Nervosa; Anxiety    Medication management with supportive therapy. Risks/benefits and SE of the medication discussed. Pt verbalized understanding and verbal consent obtained for treatment.  Affirm with the patient that the medications are taken as ordered. Patient expressed understanding of how their medications were to be used.    Meds: Celexa 40mg  po qD for mood Lamictal 125mg  po qD for mood issues. Increase Lunesta 3mg  po qHS prn insomnia Vyvanse 20mg  po qD for BED Xanax 0.5mg  po qD prn anxiety- given 15 tabs Latuda 20mg  po qD for mood    Labs: none  Therapy: brief supportive therapy provided. Discussed psychosocial stressors in detail.    Consultations: none  Pt denies SI and is at an acute low risk for suicide. Patient told to call clinic if any problems occur. Patient advised to go to ER if they should develop SI/HI, side effects, or if symptoms worsen. Has crisis numbers to call if needed. Pt verbalized understanding.  F/up in 2 months or sooner if needed    Oletta DarterSalina Michele Kerlin, MD 05/25/2017, 1:43 PM

## 2017-07-27 ENCOUNTER — Ambulatory Visit (HOSPITAL_COMMUNITY): Payer: Self-pay | Admitting: Psychiatry

## 2017-09-07 ENCOUNTER — Encounter (HOSPITAL_COMMUNITY): Payer: Self-pay | Admitting: Psychiatry

## 2017-09-07 ENCOUNTER — Ambulatory Visit (HOSPITAL_COMMUNITY): Payer: 59 | Admitting: Psychiatry

## 2017-09-07 DIAGNOSIS — F1099 Alcohol use, unspecified with unspecified alcohol-induced disorder: Secondary | ICD-10-CM

## 2017-09-07 DIAGNOSIS — F99 Mental disorder, not otherwise specified: Secondary | ICD-10-CM

## 2017-09-07 DIAGNOSIS — F17210 Nicotine dependence, cigarettes, uncomplicated: Secondary | ICD-10-CM

## 2017-09-07 DIAGNOSIS — Z818 Family history of other mental and behavioral disorders: Secondary | ICD-10-CM

## 2017-09-07 DIAGNOSIS — R450 Nervousness: Secondary | ICD-10-CM

## 2017-09-07 DIAGNOSIS — F331 Major depressive disorder, recurrent, moderate: Secondary | ICD-10-CM | POA: Diagnosis not present

## 2017-09-07 DIAGNOSIS — F50819 Binge eating disorder, unspecified: Secondary | ICD-10-CM

## 2017-09-07 DIAGNOSIS — F4321 Adjustment disorder with depressed mood: Secondary | ICD-10-CM | POA: Diagnosis not present

## 2017-09-07 DIAGNOSIS — F4310 Post-traumatic stress disorder, unspecified: Secondary | ICD-10-CM

## 2017-09-07 DIAGNOSIS — F502 Bulimia nervosa: Secondary | ICD-10-CM | POA: Diagnosis not present

## 2017-09-07 DIAGNOSIS — F411 Generalized anxiety disorder: Secondary | ICD-10-CM

## 2017-09-07 DIAGNOSIS — F5081 Binge eating disorder: Secondary | ICD-10-CM

## 2017-09-07 DIAGNOSIS — F5105 Insomnia due to other mental disorder: Secondary | ICD-10-CM

## 2017-09-07 DIAGNOSIS — Z811 Family history of alcohol abuse and dependence: Secondary | ICD-10-CM

## 2017-09-07 MED ORDER — ESZOPICLONE 3 MG PO TABS: 3.0000 mg | ORAL_TABLET | Freq: Every evening | ORAL | 0 refills | Status: DC | PRN

## 2017-09-07 MED ORDER — ALPRAZOLAM 0.5 MG PO TABS: 0.5000 mg | ORAL_TABLET | Freq: Every evening | ORAL | 0 refills | Status: DC | PRN

## 2017-09-07 MED ORDER — LAMOTRIGINE 100 MG PO TABS: ORAL_TABLET | ORAL | 0 refills | Status: DC

## 2017-09-07 MED ORDER — LURASIDONE HCL 40 MG PO TABS: 40.0000 mg | ORAL_TABLET | Freq: Every day | ORAL | 0 refills | Status: DC

## 2017-09-07 MED ORDER — CITALOPRAM HYDROBROMIDE 40 MG PO TABS: 40.0000 mg | ORAL_TABLET | Freq: Every day | ORAL | 0 refills | Status: DC

## 2017-09-07 MED ORDER — LAMOTRIGINE 25 MG PO TABS: ORAL_TABLET | ORAL | 0 refills | Status: DC

## 2017-09-07 MED ORDER — LISDEXAMFETAMINE DIMESYLATE 30 MG PO CAPS: 30.0000 mg | ORAL_CAPSULE | Freq: Every day | ORAL | 0 refills | Status: DC

## 2017-09-07 NOTE — Progress Notes (Signed)
BH MD/PA/NP OP Progress Note  09/07/2017 3:16 PM Monica Jensen  MRN:  [id number]  Chief Complaint:  Chief Complaint    Depression; Anxiety; Follow-up     HPI: "I'm ok". Work remains extremely stressful and overwhelming. She is working long hours. Her son is very depressed and she is trying to help him.  She is depressed but it comes and goes. Pt is sleeping when she can with Lunesta. Pt is getting about 4 hrs/night. Mostly it has to do with her son. She is sad and has crying spells. Pt denies SI/HI.  Pt denies recent manic and hypomanic symptoms including periods of decreased need for sleep, increased energy, mood lability, impulsivity, FOI, and excessive spending.  Anxiety is worse than depression. She has racing thoughts and inability to relax. She is always fearful that her son will kill himself.  Pt is binging more than before. She has been having 24-48 hr episodes. She will eat "everything in sight". She purges at the end. It helps her calm down and sleep. It happens once a week. Pt wants to go up on Vyvanse.   Concentration is poor lately.   Pt states-taking meds as prescribed and denies SE.    Past Psychiatric History:  Anxiety: yes Bipolar Disorder: No Depression: Yes Mania: No  Psychosis: No  Schizophrenia: No  Personality Disorder: No  Hospitalization for psychiatric illness: Yes  History of Electroconvulsive Shock Therapy: No  Prior Suicide Attempts: No   Past Medical History:  Past Medical History:  Diagnosis Date  . Anemia 15 YRS AGO, NONE RECENT  . Arthritis    LOWER BACK AND HIPS  . Broken foot    BOTH FEET LEFT FOOT CAST  . Complication of anesthesia    Epinephrine"tachycardia"-dentist  . Concussion with brief LOC   . COPD (chronic obstructive pulmonary disease) (HCC)    MILD  . Depression   . GERD (gastroesophageal reflux disease)   . Headache(784.0)    migraines  . Hip pain   . History of hiatal hernia   . History of kidney stones    passed   . 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 Fears memory loss. Questions  About  Recent memory, loss related to [place].   . Hypothyroidism   . Memory loss HX OF WITH CONCUSSION  . Menopause   . Ovarian cyst   . TIA (transient ischemic attack)    SUSPECTED ABOUT 11 YEARS AGO    Past Surgical History:  Procedure Laterality Date  . ABDOMINAL SURGERY     x2(laparoscopic, 1 open), NO FALLOPIAN TUBES REMOVED WITH SURGERY  . APPENDECTOMY    . CESAREAN SECTION     X 1  . COLON SURGERY  1986   "blockage"colectomy  . COLONOSCOPY WITH PROPOFOL N/A 10/01/2013   Procedure: COLONOSCOPY WITH PROPOFOL;  Surgeon: Charolett Bumpers, MD;  Location: [facility];  Service: Endoscopy;  Laterality: N/A;  . ESOPHAGOGASTRODUODENOSCOPY (EGD) WITH PROPOFOL N/A 09/26/2016   Procedure: ESOPHAGOGASTRODUODENOSCOPY (EGD) WITH PROPOFOL;  Surgeon: Charolett Bumpers, MD;  Location: [facility];  Service: Endoscopy;  Laterality: N/A;  . HEMORROIDECTOMY    . HEMORROIDECTOMY     4x- last in Dec 2016  . MOUTH SURGERY  December 19, 2013   implant  . ORIF TOE FRACTURE Right 06/19/2014   Procedure: RIGHT FOOT OPEN REDUCTION INTERNAL FIXATION (ORIF) FIFTH METATARSAL ;  Surgeon: Loreta Ave, MD;  Location: [facility];  Service: Orthopedics;  Laterality: Right;    Family Psychiatric History: Family History  Problem Relation Age of Onset  . Heart disease Maternal Grandfather   . Heart disease Maternal Grandmother   . Lung cancer Maternal Grandmother   . Anxiety disorder Sister   . Depression Sister   . Alcohol abuse Sister   . Anxiety disorder Cousin   . Alcohol abuse Cousin   . Depression Cousin        committed suicide  . Suicidality Cousin     Social History:  Social History   Socioeconomic History  . Marital status: Single    Spouse name: None  . Number of children: 1  . Years of education: College   . Highest education level: None  Social Needs  . Financial resource strain: None  . Food insecurity - worry: None  . Food insecurity - inability: None  . Transportation needs - medical: None  . Transportation needs - non-medical: None  Occupational History    Employer: [employer]  Tobacco Use  . Smoking status: Current Some Day Smoker    Packs/day: 0.20    Years: 33.00    Pack years: 6.60    Types: Cigarettes  . Smokeless tobacco: Never Used  . Tobacco comment: Has cut back to 2-3 a day and trying to quit  Substance and Sexual Activity  . Alcohol use: Yes    Alcohol/week: 1.2 oz    Types: 1 Glasses of wine, 1 Cans of beer per week    Comment: social- on weekends a couple of glasses of wine  . Drug use: No  . Sexual activity: Not Currently  Other Topics Concern  . None  Social History Narrative   Patient lives with her fianceeLaure Kidney) and her son.   Patient has one child.   Patient has college education.   Patient works at [employer] (full-time).   Patient is right-handed.   Patient drinks four cups of caffeine per day.    Allergies:  Allergies  Allergen Reactions  . Penicillins Anaphylaxis    Has patient had a PCN reaction causing immediate rash, facial/tongue/throat swelling, SOB or lightheadedness with hypotension: Yes Has patient had a PCN reaction causing severe rash involving mucus membranes or skin necrosis: No Has patient had a PCN reaction that required hospitalization: No Has patient had a PCN reaction occurring within the last 10 years: No If all of the above answers are "NO", then may proceed with Cephalosporin use.   Izola Price Other (See Comments)    Headache  . Chocolate Other (See Comments)    Headache   . Other Other (See Comments)    MSG-Headache  . Prednisone Swelling    NAUSEA, STOMACH ISSUES  . Epinephrine Palpitations    Heart racing    Metabolic Disorder Labs: No results found for: HGBA1C, MPG No results found for:  PROLACTIN No results found for: CHOL, TRIG, HDL, CHOLHDL, VLDL, LDLCALC No results found for: TSH  Therapeutic Level Labs: No results found for: LITHIUM No results found for: VALPROATE No components found for:  CBMZ  Current Medications: Current Outpatient Medications  Medication Sig Dispense Refill  . ALPRAZolam (XANAX) 0.5 MG tablet Take 1 tablet (0.5 mg total) by mouth at bedtime as needed for anxiety. 20 tablet 0  . citalopram (CELEXA) 40 MG tablet Take 1 tablet (40 mg total) by mouth daily. 90 tablet 0  . Eszopiclone 3 MG TABS Take 1  tablet (3 mg total) by mouth at bedtime as needed. Take immediately before bedtime 90 tablet 0  . ibuprofen (ADVIL,MOTRIN) 200 MG tablet Take 400 mg by mouth daily as needed for mild pain.     Marland Kitchen lamoTRIgine (LAMICTAL) 100 MG tablet Take 1 a day for total of 125mg  (with 25 mg) po qD 90 tablet 0  . lamoTRIgine (LAMICTAL) 25 MG tablet Take 1 a day for total of 125mg  (with 100 mg) po qD 90 tablet 0  . levothyroxine (SYNTHROID, LEVOTHROID) 88 MCG tablet Take 88 mcg by mouth every evening.     . lisdexamfetamine (VYVANSE) 30 MG capsule Take 1 capsule (30 mg total) by mouth daily. 30 capsule 0  . lisdexamfetamine (VYVANSE) 30 MG capsule Take 1 capsule (30 mg total) by mouth daily. 30 capsule 0  . meloxicam (MOBIC) 15 MG tablet Take 15 mg by mouth daily as needed for pain.     Marland Kitchen norethindrone-ethinyl estradiol (JINTELI) 1-5 MG-MCG TABS tablet Take 1 tablet by mouth every evening.    . pantoprazole (PROTONIX) 40 MG tablet Take 40 mg by mouth every evening.     . lurasidone (LATUDA) 40 MG TABS tablet Take 1 tablet (40 mg total) by mouth daily with breakfast. 90 tablet 0  . umeclidinium-vilanterol (ANORO ELLIPTA) 62.5-25 MCG/INH AEPB Inhale 1 puff into the lungs daily.     No current facility-administered medications for this visit.      Musculoskeletal: Strength & Muscle Tone: within normal limits Gait & Station: normal Patient leans: N/A  Psychiatric  Specialty Exam: Review of Systems  Constitutional: Negative for chills, diaphoresis and fever.  Neurological: Negative for weakness.  Psychiatric/Behavioral: Positive for depression. Negative for hallucinations, substance abuse and suicidal ideas. The patient is nervous/anxious and has insomnia.     Blood pressure 127/90, pulse (!) 103, height 5\' 3"  (1.6 m), weight 115 lb 3.2 oz (52.3 kg), last menstrual period 06/03/2014.Body mass index is 20.41 kg/m.  General Appearance: Casual  Eye Contact:  Good  Speech:  Clear and Coherent and Normal Rate  Volume:  Normal  Mood:  Anxious and Depressed  Affect:  Congruent  Thought Process:  Goal Directed and Descriptions of Associations: Intact  Orientation:  Full (Time, Place, and Person)  Thought Content: Logical   Suicidal Thoughts:  No  Homicidal Thoughts:  No  Memory:  Immediate;   Good Recent;   Good Remote;   Good  Judgement:  Good  Insight:  Good  Psychomotor Activity:  Normal  Concentration:  Concentration: Good and Attention Span: Good  Recall:  Good  Fund of Knowledge: Good  Language: Good  Akathisia:  No  Handed:  Right  AIMS (if indicated): not done  Assets:  Communication Skills Desire for Improvement Housing Transportation  ADL's:  Intact  Cognition: WNL  Sleep:  Poor   Screenings:   Assessment and Plan: MDD-recurrent, moderate vs Bipolar II disorder; Insomnia; Bulimia Nervosa; Adjustment disorder with anxious mood      Medication management with supportive therapy. Risks/benefits and SE of the medication discussed. Pt verbalized understanding and verbal consent obtained for treatment.  Affirm with the patient that the medications are taken as ordered. Patient expressed understanding of how their medications were to be used.      Meds: Celexa 40mg  po qD for mood Lamictal 125mg  po qD for mood issues. Lunesta 3mg  po qHS prn insomnia Increase Vyvanse 30mg  po qD for BED Xanax 0.5mg  po qD prn anxiety- given 15 tabs  but pt  states she is not taking it Increase Latuda 40mg  po qD for mood     Labs: none   Therapy: brief supportive therapy provided. Discussed psychosocial stressors in detail.     Consultations: none   Pt denies SI and is at an acute low risk for suicide. Patient told to call clinic if any problems occur. Patient advised to go to ER if they should develop SI/HI, side effects, or if symptoms worsen. Has crisis numbers to call if needed. Pt verbalized understanding.   F/up in 2 months or sooner if needed    Oletta DarterSalina Veva Grimley, MD 09/07/2017, 3:16 PM

## 2017-09-13 ENCOUNTER — Other Ambulatory Visit (HOSPITAL_COMMUNITY): Payer: Self-pay

## 2017-09-13 DIAGNOSIS — F331 Major depressive disorder, recurrent, moderate: Secondary | ICD-10-CM

## 2017-09-13 MED ORDER — LURASIDONE HCL 40 MG PO TABS: 40.0000 mg | ORAL_TABLET | Freq: Every day | ORAL | 0 refills | Status: DC

## 2017-11-09 ENCOUNTER — Encounter (HOSPITAL_COMMUNITY): Payer: Self-pay | Admitting: Psychiatry

## 2017-11-09 ENCOUNTER — Other Ambulatory Visit: Payer: Self-pay

## 2017-11-09 ENCOUNTER — Ambulatory Visit (HOSPITAL_COMMUNITY): Payer: 59 | Admitting: Psychiatry

## 2017-11-09 DIAGNOSIS — F5105 Insomnia due to other mental disorder: Secondary | ICD-10-CM

## 2017-11-09 DIAGNOSIS — F5081 Binge eating disorder: Secondary | ICD-10-CM | POA: Diagnosis not present

## 2017-11-09 DIAGNOSIS — F17210 Nicotine dependence, cigarettes, uncomplicated: Secondary | ICD-10-CM | POA: Diagnosis not present

## 2017-11-09 DIAGNOSIS — F331 Major depressive disorder, recurrent, moderate: Secondary | ICD-10-CM | POA: Diagnosis not present

## 2017-11-09 DIAGNOSIS — F411 Generalized anxiety disorder: Secondary | ICD-10-CM | POA: Diagnosis not present

## 2017-11-09 DIAGNOSIS — Z818 Family history of other mental and behavioral disorders: Secondary | ICD-10-CM

## 2017-11-09 DIAGNOSIS — F99 Mental disorder, not otherwise specified: Secondary | ICD-10-CM | POA: Diagnosis not present

## 2017-11-09 DIAGNOSIS — Z566 Other physical and mental strain related to work: Secondary | ICD-10-CM | POA: Diagnosis not present

## 2017-11-09 DIAGNOSIS — Z811 Family history of alcohol abuse and dependence: Secondary | ICD-10-CM | POA: Diagnosis not present

## 2017-11-09 DIAGNOSIS — R450 Nervousness: Secondary | ICD-10-CM

## 2017-11-09 MED ORDER — CITALOPRAM HYDROBROMIDE 40 MG PO TABS: 40.0000 mg | ORAL_TABLET | Freq: Every day | ORAL | 0 refills | Status: DC

## 2017-11-09 MED ORDER — LISDEXAMFETAMINE DIMESYLATE 30 MG PO CAPS: 30.0000 mg | ORAL_CAPSULE | Freq: Every day | ORAL | 0 refills | Status: DC

## 2017-11-09 MED ORDER — ESZOPICLONE 3 MG PO TABS: 3.0000 mg | ORAL_TABLET | Freq: Every evening | ORAL | 0 refills | Status: DC | PRN

## 2017-11-09 MED ORDER — LAMOTRIGINE 100 MG PO TABS: ORAL_TABLET | ORAL | 0 refills | Status: DC

## 2017-11-09 MED ORDER — LURASIDONE HCL 20 MG PO TABS: 20.0000 mg | ORAL_TABLET | Freq: Every day | ORAL | 0 refills | Status: DC

## 2017-11-09 MED ORDER — LAMOTRIGINE 25 MG PO TABS: ORAL_TABLET | ORAL | 0 refills | Status: DC

## 2017-11-09 MED ORDER — ALPRAZOLAM 0.5 MG PO TABS: 0.5000 mg | ORAL_TABLET | Freq: Every evening | ORAL | 1 refills | Status: DC | PRN

## 2017-11-09 NOTE — Progress Notes (Signed)
BH MD/PA/NP OP Progress Note  11/09/2017 4:07 PM Monica Jensen  MRN:  [id number]  Chief Complaint:  Chief Complaint    Anxiety; Depression; Follow-up     HPI: "A lot has happened since I last saw you". Her son is at a residential treatment study at the [facility]. He will be there for 3 months. She is hopeful.   Pt broke up with her boyfriend in Feb. He moved out and they are friendly with each other.   Work remains very stressful.  Even though she has been stressed and has cried a lot. She does think her depression is stable. Pt denies SI/HI.  Binging episodes have decreased to 2-3x/week since Vyvanse was increased. Pt is using it as a coping mechanism to decrease her anxiety and "numb out". Her focus has also improved.   Sleep has improved dramatically. She is taking Lunesta every night but a few times she has taken Xanax with it a few times and notes it helps her sleep well.  Pt states-taking meds as prescribed. She reports Latuda  she experience "lock jaw" that improved after 2 days of decreasing the dose down to .  Visit Diagnosis:    ICD-10-CM   1. Anxiety state F41.1 ALPRAZolam (XANAX) 0.5 MG tablet    citalopram (CELEXA) 40 MG tablet    DISCONTINUED: citalopram (CELEXA) 40 MG tablet  2. Major depressive disorder, recurrent episode, moderate (HCC) F33.1 citalopram (CELEXA) 40 MG tablet    lamoTRIgine (LAMICTAL) 100 MG tablet    lamoTRIgine (LAMICTAL) 25 MG tablet    lurasidone (LATUDA) 20 MG TABS tablet    DISCONTINUED: citalopram (CELEXA) 40 MG tablet    DISCONTINUED: lamoTRIgine (LAMICTAL) 100 MG tablet    DISCONTINUED: lamoTRIgine (LAMICTAL) 25 MG tablet    DISCONTINUED: lurasidone (LATUDA) 20 MG TABS tablet  3. Insomnia due to other mental disorder F51.05 Eszopiclone 3 MG TABS   F99 DISCONTINUED: Eszopiclone 3 MG TABS    DISCONTINUED: Eszopiclone 3 MG TABS  4. Binge eating disorder F50.81 lisdexamfetamine (VYVANSE) 30 MG capsule    lisdexamfetamine (VYVANSE) 30 MG  capsule      Past Psychiatric History:  Anxiety:yes Bipolar Disorder:No Depression:Yes Mania:No  Psychosis:No  Schizophrenia:No  Personality Disorder:No  Hospitalization for psychiatric illness:Yes  History of Electroconvulsive Shock Therapy:No  Prior Suicide Attempts:No    Past Medical History:  Past Medical History:  Diagnosis Date  . Anemia 15 YRS AGO, NONE RECENT  . Arthritis    LOWER BACK AND HIPS  . Broken foot    BOTH FEET LEFT FOOT CAST  . Complication of anesthesia    Epinephrine"tachycardia"-dentist  . Concussion with brief LOC   . COPD (chronic obstructive pulmonary disease) (HCC)    MILD  . Depression   . GERD (gastroesophageal reflux disease)   . Headache(784.0)    migraines  . Hip pain   . History of hiatal hernia   . History of kidney stones    passed  . 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 Fears memory loss. Questions  About  Recent memory, loss related to [place].   . Hypothyroidism   . Memory loss HX OF WITH CONCUSSION  . Menopause   . Ovarian cyst   . TIA (transient ischemic attack)    SUSPECTED ABOUT 11 YEARS AGO    Past Surgical History:  Procedure Laterality Date  . ABDOMINAL SURGERY     x2(laparoscopic,  1 open), NO FALLOPIAN TUBES REMOVED WITH SURGERY  . APPENDECTOMY    . CESAREAN SECTION     X 1  . COLON SURGERY  1986   "blockage"colectomy  . COLONOSCOPY WITH PROPOFOL N/A 10/01/2013   Procedure: COLONOSCOPY WITH PROPOFOL;  Surgeon: Charolett Bumpers, MD;  Location: [facility];  Service: Endoscopy;  Laterality: N/A;  . ESOPHAGOGASTRODUODENOSCOPY (EGD) WITH PROPOFOL N/A 09/26/2016   Procedure: ESOPHAGOGASTRODUODENOSCOPY (EGD) WITH PROPOFOL;  Surgeon: Charolett Bumpers, MD;  Location: [facility];  Service: Endoscopy;  Laterality: N/A;  . HEMORROIDECTOMY    . HEMORROIDECTOMY     4x- last in Dec 2016  . MOUTH SURGERY  December 19, 2013   implant  . ORIF TOE FRACTURE Right 06/19/2014   Procedure: RIGHT FOOT OPEN REDUCTION INTERNAL FIXATION (ORIF) FIFTH METATARSAL ;  Surgeon: Loreta Ave, MD;  Location: [facility];  Service: Orthopedics;  Laterality: Right;    Family Psychiatric History:  Family History  Problem Relation Age of Onset  . Heart disease Maternal Grandfather   . Heart disease Maternal Grandmother   . Lung cancer Maternal Grandmother   . Anxiety disorder Sister   . Depression Sister   . Alcohol abuse Sister   . Anxiety disorder Cousin   . Alcohol abuse Cousin   . Depression Cousin        committed suicide  . Suicidality Cousin     Social History:  Social History   Socioeconomic History  . Marital status: Single    Spouse name: Not on file  . Number of children: 1  . Years of education: College  . Highest education level: Not on file  Occupational History    Employer: [employer]  Social Needs  . Financial resource strain: Not on file  . Food insecurity:    Worry: Not on file    Inability: Not on file  . Transportation needs:    Medical: Not on file    Non-medical: Not on file  Tobacco Use  . Smoking status: Current Some Day Smoker    Packs/day: 0.20    Years: 33.00    Pack years: 6.60    Types: Cigarettes  . Smokeless tobacco: Never Used  . Tobacco comment: Has cut back to 2-3 a day and trying to quit  Substance and Sexual Activity  . Alcohol use: Yes    Alcohol/week: 1.2 oz    Types: 1 Glasses of wine, 1 Cans of beer per week    Comment: social- on weekends a couple of glasses of wine  . Drug use: No  . Sexual activity: Not Currently  Lifestyle  . Physical activity:    Days per week: Not on file    Minutes per session: Not on file  . Stress: Not on file  Relationships  . Social connections:    Talks on phone: Not on file    Gets together: Not on file    Attends religious service: Not on file    Active member of club or organization: Not on file     Attends meetings of clubs or organizations: Not on file    Relationship status: Not on file  Other Topics Concern  . Not on file  Social History Narrative   Patient lives with her fianceeLaure Kidney) and her son.   Patient has one child.   Patient has college education.   Patient works at [employer] (full-time).   Patient is right-handed.   Patient  drinks four cups of caffeine per day.    Allergies:  Allergies  Allergen Reactions  . Penicillins Anaphylaxis    Has patient had a PCN reaction causing immediate rash, facial/tongue/throat swelling, SOB or lightheadedness with hypotension: Yes Has patient had a PCN reaction causing severe rash involving mucus membranes or skin necrosis: No Has patient had a PCN reaction that required hospitalization: No Has patient had a PCN reaction occurring within the last 10 years: No If all of the above answers are "NO", then may proceed with Cephalosporin use.   Izola Price Other (See Comments)    Headache  . Chocolate Other (See Comments)    Headache   . Other Other (See Comments)    MSG-Headache  . Prednisone Swelling    NAUSEA, STOMACH ISSUES  . Epinephrine Palpitations    Heart racing    Metabolic Disorder Labs: No results found for: HGBA1C, MPG No results found for: PROLACTIN No results found for: CHOL, TRIG, HDL, CHOLHDL, VLDL, LDLCALC No results found for: TSH  Therapeutic Level Labs: No results found for: LITHIUM No results found for: VALPROATE No components found for:  CBMZ  Current Medications: Current Outpatient Medications  Medication Sig Dispense Refill  . ALPRAZolam (XANAX) 0.5 MG tablet Take 1 tablet (0.5 mg total) by mouth at bedtime as needed for anxiety. 20 tablet 1  . citalopram (CELEXA) 40 MG tablet Take 1 tablet (40 mg total) by mouth daily. 90 tablet 0  . Eszopiclone 3 MG TABS Take 1 tablet (3 mg total) by mouth at bedtime as needed. Take immediately before bedtime 90 tablet 0  . ibuprofen  (ADVIL,MOTRIN) 200 MG tablet Take 400 mg by mouth daily as needed for mild pain.     Marland Kitchen lamoTRIgine (LAMICTAL) 100 MG tablet Take 1 a day for total of  (with 25 mg) po qD 90 tablet 0  . lamoTRIgine (LAMICTAL) 25 MG tablet Take 1 a day for total of  (with 100 mg) po qD 90 tablet 0  . levothyroxine (SYNTHROID, LEVOTHROID) 88 MCG tablet Take 88 mcg by mouth every evening.     . lisdexamfetamine (VYVANSE) 30 MG capsule Take 1 capsule (30 mg total) by mouth daily. 30 capsule 0  . lisdexamfetamine (VYVANSE) 30 MG capsule Take 1 capsule (30 mg total) by mouth daily. 30 capsule 0  . lurasidone (LATUDA) 20 MG TABS tablet Take 1 tablet (20 mg total) by mouth daily with breakfast. 90 tablet 0  . norethindrone-ethinyl estradiol (JINTELI) 1-5 MG-MCG TABS tablet Take 1 tablet by mouth every evening.    . pantoprazole (PROTONIX) 40 MG tablet Take 40 mg by mouth every evening.      No current facility-administered medications for this visit.      Musculoskeletal: Strength & Muscle Tone: within normal limits Gait & Station: normal Patient leans: N/A  Psychiatric Specialty Exam: Review of Systems  Constitutional: Negative for chills, diaphoresis and fever.  Psychiatric/Behavioral: Positive for depression. Negative for hallucinations and suicidal ideas. The patient is nervous/anxious.     Blood pressure 135/87, pulse 97, temperature 97.7 F (36.5 C), temperature source Oral, resp. rate 16, weight 109 lb 12.8 oz (49.8 kg), last menstrual period 06/03/2014, SpO2 97 %.Body mass index is 19.45 kg/m.  General Appearance: Fairly Groomed  Eye Contact:  Good  Speech:  Clear and Coherent and Normal Rate  Volume:  Normal  Mood:  Anxious and Depressed  Affect:  Full Range  Thought Process:  Goal Directed and Descriptions of Associations: Intact  Orientation:  Full (Time, Place, and Person)  Thought Content: Logical   Suicidal Thoughts:  No  Homicidal Thoughts:  No  Memory:  Immediate;    Good Recent;   Good Remote;   Good  Judgement:  Good  Insight:  Good  Psychomotor Activity:  Normal  Concentration:  Concentration: Good and Attention Span: Good  Recall:  Good  Fund of Knowledge: Good  Language: Good  Akathisia:  No  Handed:  Right  AIMS (if indicated): not done  Assets:  Communication Skills Desire for Improvement Financial Resources/Insurance Housing Leisure Time Resilience Social Support Transportation Vocational/Educational  ADL's:  Intact  Cognition: WNL  Sleep:  Good   Screenings:  I reviewed the information below on 11/09/2017 and agree except where noted/changed Assessment and Plan: MDD-recurrent, moderate vs Bipolar II disorder; Insomnia; Bulimia Nervosa; Adjustment disorder with anxious mood   Medication management with supportive therapy. Risks/benefits and SE of the medication discussed. Pt verbalized understanding and verbal consent obtained for treatment. Affirm with the patient that the medications are taken as ordered. Patient expressed understanding of how their medications were to be used.    Meds:Celexa  po qD for mood Lamictal  po qD for mood issues. Lunesta  po qHS prn insomnia Vyvanse  po qD for BED Xanax 0.5mg  po qD prn anxiety- given 20 tabs  decrease Latuda  po qD for mood- higher doses cause TD symptoms pt feels she is doing well and does not want any med changes at this time  Labs:none  Therapy: brief supportive therapy provided. Discussed psychosocial stressors in detail.   Consultations:encouraged pt to continue therapy  Pt denies SI and is at an acute low risk for suicide. Patient told to call clinic if any problems occur. Patient advised to go to ER if they should develop SI/HI, side effects, or if symptoms worsen. Has crisis numbers to call if needed. Pt verbalized understanding.  F/up in24months or sooner if needed     Oletta Darter, MD 11/09/2017, 4:07 PM

## 2018-01-25 ENCOUNTER — Ambulatory Visit (HOSPITAL_COMMUNITY): Payer: 59 | Admitting: Psychiatry

## 2018-01-25 DIAGNOSIS — F5105 Insomnia due to other mental disorder: Secondary | ICD-10-CM

## 2018-01-25 DIAGNOSIS — F17210 Nicotine dependence, cigarettes, uncomplicated: Secondary | ICD-10-CM

## 2018-01-25 DIAGNOSIS — F5081 Binge eating disorder: Secondary | ICD-10-CM | POA: Diagnosis not present

## 2018-01-25 DIAGNOSIS — F411 Generalized anxiety disorder: Secondary | ICD-10-CM

## 2018-01-25 DIAGNOSIS — Z566 Other physical and mental strain related to work: Secondary | ICD-10-CM

## 2018-01-25 DIAGNOSIS — Z818 Family history of other mental and behavioral disorders: Secondary | ICD-10-CM

## 2018-01-25 DIAGNOSIS — F331 Major depressive disorder, recurrent, moderate: Secondary | ICD-10-CM | POA: Diagnosis not present

## 2018-01-25 DIAGNOSIS — F99 Mental disorder, not otherwise specified: Secondary | ICD-10-CM

## 2018-01-25 DIAGNOSIS — Z811 Family history of alcohol abuse and dependence: Secondary | ICD-10-CM

## 2018-01-25 DIAGNOSIS — R450 Nervousness: Secondary | ICD-10-CM

## 2018-01-25 MED ORDER — LISDEXAMFETAMINE DIMESYLATE 30 MG PO CAPS: 30.0000 mg | ORAL_CAPSULE | Freq: Every day | ORAL | 0 refills | Status: DC

## 2018-01-25 MED ORDER — LURASIDONE HCL 20 MG PO TABS: 20.0000 mg | ORAL_TABLET | Freq: Every day | ORAL | 0 refills | Status: DC

## 2018-01-25 MED ORDER — ESZOPICLONE 3 MG PO TABS: 3.0000 mg | ORAL_TABLET | Freq: Every evening | ORAL | 0 refills | Status: DC | PRN

## 2018-01-25 MED ORDER — CITALOPRAM HYDROBROMIDE 40 MG PO TABS: 40.0000 mg | ORAL_TABLET | Freq: Every day | ORAL | 0 refills | Status: DC

## 2018-01-25 MED ORDER — LAMOTRIGINE 150 MG PO TABS: 150.0000 mg | ORAL_TABLET | Freq: Every day | ORAL | 0 refills | Status: DC

## 2018-01-25 NOTE — Progress Notes (Signed)
BH MD/PA/NP OP Progress Note  01/25/2018 10:03 AM Monica Jensen  MRN:  [id number]  Chief Complaint:  Chief Complaint    Anxiety     HPI: Pt arrived 11 min late for her scheduled 15 min appt. patient states her son came home on July 3 after spending several months in a special inpatient psychiatric unit in [place].  She is grateful that he is doing well but is very concerned as he typically has a relapse of symptoms when school starts.  She states that she is worried.  Patient is also feeling extremely overwhelmed.  There have been several changes at work and many of her colleagues have been fired due to pay cuts.  Patient's hours have been drastically cut and she is unable to make her ends meet with this current budget.  She is also worried that she will be fired.  She has started looking for other jobs but is worried that at the age of 46 he will be difficult to be hired.  She states that she is also processing her recent breakup with her longtime boyfriend and worries she will never find someone else to be with especially given her age.  She is feeling depressed and anxious.  She has low energy, low motivation and frequent crying spells.  Last week she was having some passive suicidal thoughts.  She states that those thoughts have resolved she now denies SI/HI.  Patient states that she would never kill her self.  She has been engaging in 48-hour long binging episodes.  She states that it is as though she is trying to fill a void inside of her by eating and eating and eating.  She denies any manic or hypomanic-like symptoms.  She is sleeping with the Lunesta but reports she sleeps much better when she takes [place] and Xanax together.  She states she is taking all other meds as prescribed.  Visit Diagnosis:    ICD-10-CM   1. Major depressive disorder, recurrent episode, moderate (HCC) F33.1 lamoTRIgine (LAMICTAL) 150 MG tablet    lurasidone (LATUDA) 20 MG TABS tablet    citalopram (CELEXA) 40 MG  tablet  2. Insomnia due to other mental disorder F51.05 Eszopiclone 3 MG TABS   F99   3. Binge eating disorder F50.81 lisdexamfetamine (VYVANSE) 30 MG capsule    lisdexamfetamine (VYVANSE) 30 MG capsule  4. Anxiety state F41.1 citalopram (CELEXA) 40 MG tablet      Past Psychiatric History: Anxiety: yes Bipolar Disorder: No Depression: Yes Mania: No  Psychosis: No  Schizophrenia: No  Personality Disorder: No  Hospitalization for psychiatric illness: Yes  History of Electroconvulsive Shock Therapy: No  Prior Suicide Attempts: No   Past Medical History:  Past Medical History:  Diagnosis Date  . Anemia 15 YRS AGO, NONE RECENT  . Arthritis    LOWER BACK AND HIPS  . Broken foot    BOTH FEET LEFT FOOT CAST  . Complication of anesthesia    Epinephrine"tachycardia"-dentist  . Concussion with brief LOC   . COPD (chronic obstructive pulmonary disease) (HCC)    MILD  . Depression   . GERD (gastroesophageal reflux disease)   . Headache(784.0)    migraines  . Hip pain   . History of hiatal hernia   . History of kidney stones    passed  . 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 Fears memory loss. Questions  About  Recent memory, loss related to [place].   . Hypothyroidism   . Memory loss HX OF WITH CONCUSSION  . Menopause   . Ovarian cyst   . TIA (transient ischemic attack)    SUSPECTED ABOUT 11 YEARS AGO    Past Surgical History:  Procedure Laterality Date  . ABDOMINAL SURGERY     x2(laparoscopic, 1 open), NO FALLOPIAN TUBES REMOVED WITH SURGERY  . APPENDECTOMY    . CESAREAN SECTION     X 1  . COLON SURGERY  1986   "blockage"colectomy  . COLONOSCOPY WITH PROPOFOL N/A 10/01/2013   Procedure: COLONOSCOPY WITH PROPOFOL;  Surgeon: Charolett Bumpers, MD;  Location: [facility];  Service: Endoscopy;  Laterality: N/A;  . ESOPHAGOGASTRODUODENOSCOPY (EGD) WITH PROPOFOL N/A 09/26/2016    Procedure: ESOPHAGOGASTRODUODENOSCOPY (EGD) WITH PROPOFOL;  Surgeon: Charolett Bumpers, MD;  Location: [facility];  Service: Endoscopy;  Laterality: N/A;  . HEMORROIDECTOMY    . HEMORROIDECTOMY     4x- last in Dec 2016  . MOUTH SURGERY  December 19, 2013   implant  . ORIF TOE FRACTURE Right 06/19/2014   Procedure: RIGHT FOOT OPEN REDUCTION INTERNAL FIXATION (ORIF) FIFTH METATARSAL ;  Surgeon: Loreta Ave, MD;  Location: [facility];  Service: Orthopedics;  Laterality: Right;    Family Psychiatric Hx: Family History  Problem Relation Age of Onset  . Heart disease Maternal Grandfather   . Heart disease Maternal Grandmother   . Lung cancer Maternal Grandmother   . Anxiety disorder Sister   . Depression Sister   . Alcohol abuse Sister   . Anxiety disorder Cousin   . Alcohol abuse Cousin   . Depression Cousin        committed suicide  . Suicidality Cousin     Social History:  Social History   Socioeconomic History  . Marital status: Single    Spouse name: Not on file  . Number of children: 1  . Years of education: College  . Highest education level: Not on file  Occupational History    Employer: [employer]  Social Needs  . Financial resource strain: Not on file  . Food insecurity:    Worry: Not on file    Inability: Not on file  . Transportation needs:    Medical: Not on file    Non-medical: Not on file  Tobacco Use  . Smoking status: Current Some Day Smoker    Packs/day: 0.20    Years: 33.00    Pack years: 6.60    Types: Cigarettes  . Smokeless tobacco: Never Used  . Tobacco comment: Has cut back to 2-3 a day and trying to quit  Substance and Sexual Activity  . Alcohol use: Yes    Alcohol/week: 1.2 oz    Types: 1 Glasses of wine, 1 Cans of beer per week    Comment: social- on weekends a couple of glasses of wine  . Drug use: No  . Sexual activity: Not Currently  Lifestyle  . Physical activity:    Days per week: Not on file    Minutes per  session: Not on file  . Stress: Not on file  Relationships  . Social connections:    Talks on phone: Not on file    Gets together: Not on file    Attends religious service: Not on file    Active member of club or organization: Not on file    Attends meetings of clubs or organizations: Not on file  Relationship status: Not on file  Other Topics Concern  . Not on file  Social History Narrative   Patient lives with her [name] and her son.   Patient has one child.   Patient has college education.   Patient works at [employer] (full-time).   Patient is right-handed.   Patient drinks four cups of caffeine per day.    Allergies:  Allergies  Allergen Reactions  . Penicillins Anaphylaxis    Has patient had a PCN reaction causing immediate rash, facial/tongue/throat swelling, SOB or lightheadedness with hypotension: Yes Has patient had a PCN reaction causing severe rash involving mucus membranes or skin necrosis: No Has patient had a PCN reaction that required hospitalization: No Has patient had a PCN reaction occurring within the last 10 years: No If all of the above answers are "NO", then may proceed with Cephalosporin use.   Izola Price. Cheese Other (See Comments)    Headache  . Chocolate Other (See Comments)    Headache   . Other Other (See Comments)    MSG-Headache  . Prednisone Swelling    NAUSEA, STOMACH ISSUES  . Epinephrine Palpitations    Heart racing    Metabolic Disorder Labs: No results found for: HGBA1C, MPG No results found for: PROLACTIN No results found for: CHOL, TRIG, HDL, CHOLHDL, VLDL, LDLCALC No results found for: TSH  Therapeutic Level Labs: No results found for: LITHIUM No results found for: VALPROATE No components found for:  CBMZ  Current Medications: Current Outpatient Medications  Medication Sig Dispense Refill  . ALPRAZolam (XANAX) 0.5 MG tablet Take 1 tablet (0.5 mg total) by mouth at bedtime as needed for anxiety. 20 tablet 1   . citalopram (CELEXA) 40 MG tablet Take 1 tablet (40 mg total) by mouth daily. 90 tablet 0  . Eszopiclone 3 MG TABS Take 1 tablet (3 mg total) by mouth at bedtime as needed. Take immediately before bedtime 90 tablet 0  . ibuprofen (ADVIL,MOTRIN) 200 MG tablet Take 400 mg by mouth daily as needed for mild pain.     Marland Kitchen. lamoTRIgine (LAMICTAL) 150 MG tablet Take 1 tablet (150 mg total) by mouth daily. 90 tablet 0  . levothyroxine (SYNTHROID, LEVOTHROID) 88 MCG tablet Take 88 mcg by mouth every evening.     . lisdexamfetamine (VYVANSE) 30 MG capsule Take 1 capsule (30 mg total) by mouth daily. 30 capsule 0  . lisdexamfetamine (VYVANSE) 30 MG capsule Take 1 capsule (30 mg total) by mouth daily. 30 capsule 0  . lurasidone (LATUDA) 20 MG TABS tablet Take 1 tablet (20 mg total) by mouth daily with breakfast. 90 tablet 0  . norethindrone-ethinyl estradiol (JINTELI) 1-5 MG-MCG TABS tablet Take 1 tablet by mouth every evening.    . pantoprazole (PROTONIX) 40 MG tablet Take 40 mg by mouth every evening.      No current facility-administered medications for this visit.      Musculoskeletal: Strength & Muscle Tone: within normal limits Gait & Station: normal Patient leans: N/A  Psychiatric Specialty Exam: Review of Systems  Constitutional: Negative for chills, diaphoresis and fever.  Psychiatric/Behavioral: Positive for depression. Negative for hallucinations, substance abuse and suicidal ideas. The patient is nervous/anxious. The patient does not have insomnia.     Last menstrual period 06/03/2014.There is no height or weight on file to calculate BMI.  General Appearance: Casual  Eye Contact:  Good  Speech:  Clear and Coherent and Normal Rate  Volume:  Normal  Mood:  Anxious and  Depressed  Affect:  Congruent and Tearful  Thought Process:  Goal Directed and Descriptions of Associations: Intact  Orientation:  Full (Time, Place, and Person)  Thought Content: Logical   Suicidal Thoughts:  No   Homicidal Thoughts:  No  Memory:  Immediate;   Good Recent;   Good Remote;   Good  Judgement:  Fair  Insight:  Fair  Psychomotor Activity:  Normal  Concentration:  Concentration: Good and Attention Span: Good  Recall:  Good  Fund of Knowledge: Good  Language: Good  Akathisia:  No  Handed:  Right  AIMS (if indicated): not done  Assets:  Communication Skills Desire for Improvement Housing Talents/Skills Transportation  ADL's:  Intact  Cognition: WNL  Sleep:  Fair   Screenings:  I reviewed the information below on 01/25/2018 and agree except where noted/changed Assessment and Plan: MDD-recurrent, moderate vs Bipolar II disorder; Insomnia; Bulimia Nervosa; Adjustment disorder with anxious mood      Medication management with supportive therapy. Risks/benefits and SE of the medication discussed. Pt verbalized understanding and verbal consent obtained for treatment.  Affirm with the patient that the medications are taken as ordered. Patient expressed understanding of how their medications were to be used.      Meds: Celexa 40mg  po qD for mood Increase Lamictal 150mg  po qD for mood issues. Lunesta 3mg  po qHS prn insomnia- pt does not want her sleep aid changed Vyvanse 30mg  po qD for BED Xanax 0.5mg  po qD prn anxiety- given 20 tabs (she still has 10 tabs in the bottle after her last refill)  Latuda 20mg  po qD for mood- higher doses cause TD symptoms    Labs: none   Therapy: brief supportive therapy provided. Discussed psychosocial stressors in detail.     Consultations: encouraged pt to continue therapy- her therapist is retiring next month. Pt wants to try a CBT trained therapist next   Pt reports on/off passive SI the last time was one week ago. She is at an acute low risk for suicide. She wants to live for her son and has no hx of previous suicide attempts. Patient told to call clinic if any problems occur. Patient advised to go to ER if they should develop SI/HI, side  effects, or if symptoms worsen. Has crisis numbers to call if needed. Pt verbalized understanding.   F/up in 2 months or sooner if needed    Oletta Darter, MD 01/25/2018, 10:03 AM

## 2018-02-22 ENCOUNTER — Ambulatory Visit (HOSPITAL_COMMUNITY): Payer: Self-pay | Admitting: Licensed Clinical Social Worker

## 2018-03-08 ENCOUNTER — Ambulatory Visit (HOSPITAL_COMMUNITY): Payer: 59 | Admitting: Psychiatry

## 2018-03-08 DIAGNOSIS — F17210 Nicotine dependence, cigarettes, uncomplicated: Secondary | ICD-10-CM

## 2018-03-08 DIAGNOSIS — Z818 Family history of other mental and behavioral disorders: Secondary | ICD-10-CM | POA: Diagnosis not present

## 2018-03-08 DIAGNOSIS — F4322 Adjustment disorder with anxiety: Secondary | ICD-10-CM

## 2018-03-08 DIAGNOSIS — Z811 Family history of alcohol abuse and dependence: Secondary | ICD-10-CM | POA: Diagnosis not present

## 2018-03-08 DIAGNOSIS — F331 Major depressive disorder, recurrent, moderate: Secondary | ICD-10-CM | POA: Diagnosis not present

## 2018-03-08 DIAGNOSIS — R45851 Suicidal ideations: Secondary | ICD-10-CM

## 2018-03-08 DIAGNOSIS — F502 Bulimia nervosa: Secondary | ICD-10-CM

## 2018-03-08 MED ORDER — LAMOTRIGINE 100 MG PO TABS: 100.0000 mg | ORAL_TABLET | Freq: Every day | ORAL | 0 refills | Status: DC

## 2018-03-08 NOTE — Progress Notes (Signed)
BH MD/PA/NP OP Progress Note  03/08/2018 4:46 PM Monica Jensen  MRN:  [id number]  Chief Complaint:  Chief Complaint    Depression; Anxiety; Follow-up     HPI: Time in bed crying.  She has no motivation and low energy.  She states that she feels overwhelmed and stressed.  She states that it is always one problem after another and she is tired of it.  She has been experiencing passive suicidal thoughts due to her depression.  She denies any plan or intent.  She states over the last weekend she really thought about checking herself to an inpatient psychiatric unit but decided not to.  Instead she scheduled an earlier appointment with me.  She wonders if this has anything to do with increasing the dose of the Lamictal.  She states that she started the increased dose about 4 weeks ago and her symptoms began 3 weeks ago.  Her work stress continues.  She states that she is grieving the loss of her last relationship.  She is also grieving the loss of several friendships.  Her son is doing a lot better but worry for him also continues.  She has been engaging in binging episodes and is unhappy about it.  She denies any manic or hypomanic-like symptoms.  Due to the number of medication she has failed in the past patient is requesting a referral for TMS.  She has been experiencing extreme anxiety and panic attack-like symptoms most days of the week.  She is not utilizing the Xanax during the day.  Her rationale is that she sleeps better when she takes [place] and Xanax so taking the Xanax during the day would make her tired.  Monica Jensen says she is taking all of her other medications as prescribed.  Visit Diagnosis:    ICD-10-CM   1. Major depressive disorder, recurrent episode, moderate (HCC) F33.1 lamoTRIgine (LAMICTAL) 100 MG tablet      Past Psychiatric History: Anxiety: yes Bipolar Disorder: No Depression: Yes Mania: No  Psychosis: No  Schizophrenia: No  Personality Disorder: No  Hospitalization for  psychiatric illness: Yes  History of Electroconvulsive Shock Therapy: No  Prior Suicide Attempts: No   Past Medical History:  Past Medical History:  Diagnosis Date  . Anemia 15 YRS AGO, NONE RECENT  . Arthritis    LOWER BACK AND HIPS  . Broken foot    BOTH FEET LEFT FOOT CAST  . Complication of anesthesia    Epinephrine"tachycardia"-dentist  . Concussion with brief LOC   . COPD (chronic obstructive pulmonary disease) (HCC)    MILD  . Depression   . GERD (gastroesophageal reflux disease)   . Headache(784.0)    migraines  . Hip pain   . History of hiatal hernia   . History of Jensen stones    passed  . 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 Fears memory loss. Questions  About  Recent memory, loss related to [place].   . Hypothyroidism   . Memory loss HX OF WITH CONCUSSION  . Menopause   . Ovarian cyst   . TIA (transient ischemic attack)    SUSPECTED ABOUT 11 YEARS AGO    Past Surgical History:  Procedure Laterality Date  . ABDOMINAL SURGERY     x2(laparoscopic, 1 open), NO FALLOPIAN TUBES REMOVED WITH SURGERY  . APPENDECTOMY    . CESAREAN SECTION     X 1  .  COLON SURGERY  1986   "blockage"colectomy  . COLONOSCOPY WITH PROPOFOL N/A 10/01/2013   Procedure: COLONOSCOPY WITH PROPOFOL;  Surgeon: Charolett Bumpers, MD;  Location: [facility];  Service: Endoscopy;  Laterality: N/A;  . ESOPHAGOGASTRODUODENOSCOPY (EGD) WITH PROPOFOL N/A 09/26/2016   Procedure: ESOPHAGOGASTRODUODENOSCOPY (EGD) WITH PROPOFOL;  Surgeon: Charolett Bumpers, MD;  Location: [facility];  Service: Endoscopy;  Laterality: N/A;  . HEMORROIDECTOMY    . HEMORROIDECTOMY     4x- last in Dec 2016  . MOUTH SURGERY  December 19, 2013   implant  . ORIF TOE FRACTURE Right 06/19/2014   Procedure: RIGHT FOOT OPEN REDUCTION INTERNAL FIXATION (ORIF) FIFTH METATARSAL ;  Surgeon: Loreta Ave, MD;  Location: [facility];  Service: Orthopedics;  Laterality: Right;    Family Psychiatric Hx: Family History  Problem Relation Age of Onset  . Heart disease Maternal Grandfather   . Heart disease Maternal Grandmother   . Lung cancer Maternal Grandmother   . Anxiety disorder Sister   . Depression Sister   . Alcohol abuse Sister   . Anxiety disorder Cousin   . Alcohol abuse Cousin   . Depression Cousin        committed suicide  . Suicidality Cousin     Social History:  Social History   Socioeconomic History  . Marital status: Single    Spouse name: Not on file  . Number of children: 1  . Years of education: College  . Highest education level: Not on file  Occupational History    Employer: [employer]  Social Needs  . Financial resource strain: Not on file  . Food insecurity:    Worry: Not on file    Inability: Not on file  . Transportation needs:    Medical: Not on file    Non-medical: Not on file  Tobacco Use  . Smoking status: Current Some Day Smoker    Packs/day: 0.20    Years: 33.00    Pack years: 6.60    Types: Cigarettes  . Smokeless tobacco: Never Used  . Tobacco comment: Has cut back to 2-3 a day and trying to quit  Substance and Sexual Activity  . Alcohol use: Yes    Alcohol/week: 2.0 standard drinks    Types: 1 Glasses of wine, 1 Cans of beer per week    Comment: social- on weekends a couple of glasses of wine  . Drug use: No  . Sexual activity: Not Currently  Lifestyle  . Physical activity:    Days per week: Not on file    Minutes per session: Not on file  . Stress: Not on file  Relationships  . Social connections:    Talks on phone: Not on file    Gets together: Not on file    Attends religious service: Not on file    Active member of club or organization: Not on file    Attends meetings of clubs or organizations: Not on file    Relationship status: Not on file  Other Topics Concern  . Not on file  Social History Narrative   Patient lives with  her fianceeLaure Jensen) and her son.   Patient has one child.   Patient has college education.   Patient works at [employer] (full-time).   Patient is right-handed.   Patient drinks four cups of caffeine per day.    Allergies:  Allergies  Allergen Reactions  . Penicillins Anaphylaxis    Has patient  had a PCN reaction causing immediate rash, facial/tongue/throat swelling, SOB or lightheadedness with hypotension: Yes Has patient had a PCN reaction causing severe rash involving mucus membranes or skin necrosis: No Has patient had a PCN reaction that required hospitalization: No Has patient had a PCN reaction occurring within the last 10 years: No If all of the above answers are "NO", then may proceed with Cephalosporin use.   Izola Price Other (See Comments)    Headache  . Chocolate Other (See Comments)    Headache   . Other Other (See Comments)    MSG-Headache  . Prednisone Swelling    NAUSEA, STOMACH ISSUES  . Epinephrine Palpitations    Heart racing    Metabolic Disorder Labs: No results found for: HGBA1C, MPG No results found for: PROLACTIN No results found for: CHOL, TRIG, HDL, CHOLHDL, VLDL, LDLCALC No results found for: TSH  Therapeutic Level Labs: No results found for: LITHIUM No results found for: VALPROATE No components found for:  CBMZ  Current Medications: Current Outpatient Medications  Medication Sig Dispense Refill  . ALPRAZolam (XANAX) 0.5 MG tablet Take 1 tablet (0.5 mg total) by mouth at bedtime as needed for anxiety. 20 tablet 1  . citalopram (CELEXA) 40 MG tablet Take 1 tablet (40 mg total) by mouth daily. 90 tablet 0  . Eszopiclone 3 MG TABS Take 1 tablet (3 mg total) by mouth at bedtime as needed. Take immediately before bedtime 90 tablet 0  . ibuprofen (ADVIL,MOTRIN) 200 MG tablet Take 400 mg by mouth daily as needed for mild pain.     Marland Kitchen lamoTRIgine (LAMICTAL) 100 MG tablet Take 1 tablet (100 mg total) by mouth daily. 1 tablet 0  .  levothyroxine (SYNTHROID, LEVOTHROID) 88 MCG tablet Take 88 mcg by mouth every evening.     . lisdexamfetamine (VYVANSE) 30 MG capsule Take 1 capsule (30 mg total) by mouth daily. 30 capsule 0  . lisdexamfetamine (VYVANSE) 30 MG capsule Take 1 capsule (30 mg total) by mouth daily. 30 capsule 0  . lurasidone (LATUDA) 20 MG TABS tablet Take 1 tablet (20 mg total) by mouth daily with breakfast. 90 tablet 0  . norethindrone-ethinyl estradiol (JINTELI) 1-5 MG-MCG TABS tablet Take 1 tablet by mouth every evening.    . pantoprazole (PROTONIX) 40 MG tablet Take 40 mg by mouth every evening.      No current facility-administered medications for this visit.      Musculoskeletal: Strength & Muscle Tone: within normal limits Gait & Station: normal Patient leans: N/A  Psychiatric Specialty Exam: Review of Systems  Gastrointestinal: Positive for abdominal pain and diarrhea. Negative for constipation.  Psychiatric/Behavioral: Positive for depression and suicidal ideas. The patient is nervous/anxious. The patient does not have insomnia.     Blood pressure 118/80, pulse 67, height 5\' 4"  (1.626 m), weight 115 lb (52.2 kg), last menstrual period 06/03/2014.Body mass index is 19.74 kg/m.  General Appearance: Casual  Eye Contact:  Good  Speech:  Clear and Coherent and Normal Rate  Volume:  Normal  Mood:  Anxious and Depressed  Affect:  Congruent and Tearful  Thought Process:  Goal Directed and Descriptions of Associations: Intact  Orientation:  Full (Time, Place, and Person)  Thought Content:  Logical  Suicidal Thoughts:  Yes.  without intent/plan  Homicidal Thoughts:  No  Memory:  Immediate;   Good Recent;   Good Remote;   Good  Judgement:  Good  Insight:  Good  Psychomotor Activity:  Normal  Concentration:  Concentration:  Good and Attention Span: Good  Recall:  Good  Fund of Knowledge:  Good  Language:  Good  Akathisia:  No  Handed:  Right  AIMS (if indicated):     Assets:  Communication  Skills Desire for Improvement Housing Social Support Talents/Skills Transportation  ADL's:  Intact  Cognition:  WNL  Sleep:   fair     Screenings:  I reviewed the information below on 03/08/2018 and have updated it Assessment and Plan: MDD-recurrent, moderate vs Bipolar II disorder; Insomnia; Bulimia Nervosa; Adjustment disorder with anxious mood      Medication management with supportive therapy. Risks/benefits and SE of the medication discussed. Pt verbalized understanding and verbal consent obtained for treatment.  Affirm with the patient that the medications are taken as ordered. Patient expressed understanding of how their medications were to be used.      Meds: Celexa 40mg  po qD for mood decrease Lamictal 100mg  po qD for mood issues.-Unclear if her worsening depression symptoms have anything to do with increased Lamictal so will decrease the dose back down to 100 mg Lunesta 3mg  po qHS prn insomnia- pt does not want her sleep aid changed Vyvanse 30mg  po qD for BED Xanax 0.5mg  po qD prn anxiety-advised her to take it during the daytime when she is experiencing anxiety symptoms  Latuda 20mg  po qD for mood- higher doses cause TD symptoms    Labs: none   Therapy: brief supportive therapy provided. Discussed psychosocial stressors in detail.     Consultations: encouraged pt to continue therapy- her therapist is retired in July. Pt is now working with CBT trained therapist- she had her first appt today, next appt in 3 weeks -referred for TMS eval   Pt reports on/off passive SI for the last 3 weeks. She is at an acute moderate risk for suicide. She wants to live for her son and has no hx of previous suicide attempts. Patient told to call clinic if any problems occur. Patient advised to go to ER if they should develop SI/HI, side effects, or if symptoms worsen. Has crisis numbers to call if needed. Pt verbalized understanding.   F/up in 1 week or sooner if needed-patient is aware of  the crisis plan and states that she is not ready for inpatient psychiatric care at this point in time    Oletta Darter, MD 03/08/2018, 4:46 PM

## 2018-03-15 ENCOUNTER — Encounter (HOSPITAL_COMMUNITY): Payer: Self-pay | Admitting: Psychiatry

## 2018-03-15 ENCOUNTER — Ambulatory Visit (HOSPITAL_COMMUNITY): Payer: 59 | Admitting: Psychiatry

## 2018-03-15 VITALS — BP 110/64 | HR 66 | Ht 64.0 in | Wt 118.0 lb

## 2018-03-15 DIAGNOSIS — Z811 Family history of alcohol abuse and dependence: Secondary | ICD-10-CM | POA: Diagnosis not present

## 2018-03-15 DIAGNOSIS — Z818 Family history of other mental and behavioral disorders: Secondary | ICD-10-CM | POA: Diagnosis not present

## 2018-03-15 DIAGNOSIS — F17210 Nicotine dependence, cigarettes, uncomplicated: Secondary | ICD-10-CM | POA: Diagnosis not present

## 2018-03-15 DIAGNOSIS — F419 Anxiety disorder, unspecified: Secondary | ICD-10-CM

## 2018-03-15 DIAGNOSIS — G4700 Insomnia, unspecified: Secondary | ICD-10-CM

## 2018-03-15 DIAGNOSIS — F332 Major depressive disorder, recurrent severe without psychotic features: Secondary | ICD-10-CM

## 2018-03-15 NOTE — Progress Notes (Signed)
BH MD/PA/NP OP Progress Note  03/15/2018 11:49 AM Monica Jensen  MRN:  [id number]  Chief Complaint:  Chief Complaint    Depression; Follow-up     HPI: Patient seen for 1 week follow-up.  She states that right after our visit last week she went to the beach for 3 days with her ex-husband, son and his girlfriend.  It was a refreshing visit for her.  She states she did not think about her depression at that time but it came back as soon as she returned to her home.  Over the last several days she has been feeling depressed, overwhelmingly sad, having crying spells but denies any further SI.  She also denies HI.  She has not been engaging in any binging episodes.  She denies any manic or hypomanic-like symptoms.  She is still thinking about TMS or ketamine.  Her sleep is on the poor side.  She has been taking her medications as prescribed and states she is feeling a mild improvement from our last visit.   Visit Diagnosis:  No diagnosis found.    Past Psychiatric History: Anxiety: yes Bipolar Disorder: No Depression: Yes Mania: No  Psychosis: No  Schizophrenia: No  Personality Disorder: No  Hospitalization for psychiatric illness: Yes  History of Electroconvulsive Shock Therapy: No  Prior Suicide Attempts: No   Past Medical History:  Past Medical History:  Diagnosis Date  . Anemia 15 YRS AGO, NONE RECENT  . Arthritis    LOWER BACK AND HIPS  . Broken foot    BOTH FEET LEFT FOOT CAST  . Complication of anesthesia    Epinephrine"tachycardia"-dentist  . Concussion with brief LOC   . COPD (chronic obstructive pulmonary disease) (HCC)    MILD  . Depression   . GERD (gastroesophageal reflux disease)   . Headache(784.0)    migraines  . Hip pain   . History of hiatal hernia   . History of kidney stones    passed  . 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 Fears memory loss. Questions   About  Recent memory, loss related to [place].   . Hypothyroidism   . Memory loss HX OF WITH CONCUSSION  . Menopause   . Ovarian cyst   . TIA (transient ischemic attack)    SUSPECTED ABOUT 11 YEARS AGO    Past Surgical History:  Procedure Laterality Date  . ABDOMINAL SURGERY     x2(laparoscopic, 1 open), NO FALLOPIAN TUBES REMOVED WITH SURGERY  . APPENDECTOMY    . CESAREAN SECTION     X 1  . COLON SURGERY  1986   "blockage"colectomy  . COLONOSCOPY WITH PROPOFOL N/A 10/01/2013   Procedure: COLONOSCOPY WITH PROPOFOL;  Surgeon: Charolett Bumpers, MD;  Location: [facility];  Service: Endoscopy;  Laterality: N/A;  . ESOPHAGOGASTRODUODENOSCOPY (EGD) WITH PROPOFOL N/A 09/26/2016   Procedure: ESOPHAGOGASTRODUODENOSCOPY (EGD) WITH PROPOFOL;  Surgeon: Charolett Bumpers, MD;  Location: [facility];  Service: Endoscopy;  Laterality: N/A;  . HEMORROIDECTOMY    . HEMORROIDECTOMY     4x- last in Dec 2016  . MOUTH SURGERY  December 19, 2013   implant  . ORIF TOE FRACTURE Right 06/19/2014   Procedure: RIGHT FOOT OPEN REDUCTION INTERNAL FIXATION (ORIF) FIFTH METATARSAL ;  Surgeon: Loreta Ave, MD;  Location: [facility];  Service: Orthopedics;  Laterality: Right;    Family Psychiatric Hx: Family History  Problem  Relation Age of Onset  . Heart disease Maternal Grandfather   . Heart disease Maternal Grandmother   . Lung cancer Maternal Grandmother   . Anxiety disorder Sister   . Depression Sister   . Alcohol abuse Sister   . Anxiety disorder Cousin   . Alcohol abuse Cousin   . Depression Cousin        committed suicide  . Suicidality Cousin     Social History:  Social History   Socioeconomic History  . Marital status: Single    Spouse name: Not on file  . Number of children: 1  . Years of education: College  . Highest education level: Not on file  Occupational History    Employer: [employer]  Social Needs  . Financial resource strain: Not on file  . Food  insecurity:    Worry: Not on file    Inability: Not on file  . Transportation needs:    Medical: Not on file    Non-medical: Not on file  Tobacco Use  . Smoking status: Current Some Day Smoker    Packs/day: 0.20    Years: 33.00    Pack years: 6.60    Types: Cigarettes  . Smokeless tobacco: Never Used  . Tobacco comment: Has cut back to 2-3 a day and trying to quit  Substance and Sexual Activity  . Alcohol use: Yes    Alcohol/week: 2.0 standard drinks    Types: 1 Glasses of wine, 1 Cans of beer per week    Comment: social- on weekends a couple of glasses of wine  . Drug use: No  . Sexual activity: Not Currently  Lifestyle  . Physical activity:    Days per week: Not on file    Minutes per session: Not on file  . Stress: Not on file  Relationships  . Social connections:    Talks on phone: Not on file    Gets together: Not on file    Attends religious service: Not on file    Active member of club or organization: Not on file    Attends meetings of clubs or organizations: Not on file    Relationship status: Not on file  Other Topics Concern  . Not on file  Social History Narrative   Patient lives with her fianceeLaure Kidney) and her son.   Patient has one child.   Patient has college education.   Patient works at [employer] (full-time).   Patient is right-handed.   Patient drinks four cups of caffeine per day.    Allergies:  Allergies  Allergen Reactions  . Penicillins Anaphylaxis    Has patient had a PCN reaction causing immediate rash, facial/tongue/throat swelling, SOB or lightheadedness with hypotension: Yes Has patient had a PCN reaction causing severe rash involving mucus membranes or skin necrosis: No Has patient had a PCN reaction that required hospitalization: No Has patient had a PCN reaction occurring within the last 10 years: No If all of the above answers are "NO", then may proceed with Cephalosporin use.   Izola Price Other (See Comments)     Headache  . Chocolate Other (See Comments)    Headache   . Other Other (See Comments)    MSG-Headache  . Prednisone Swelling    NAUSEA, STOMACH ISSUES  . Epinephrine Palpitations    Heart racing    Metabolic Disorder Labs: No results found for: HGBA1C, MPG No results found for: PROLACTIN No results found for: CHOL, TRIG, HDL,  CHOLHDL, VLDL, LDLCALC No results found for: TSH  Therapeutic Level Labs: No results found for: LITHIUM No results found for: VALPROATE No components found for:  CBMZ  Current Medications: Current Outpatient Medications  Medication Sig Dispense Refill  . ALPRAZolam (XANAX) 0.5 MG tablet Take 1 tablet (0.5 mg total) by mouth at bedtime as needed for anxiety. 20 tablet 1  . citalopram (CELEXA) 40 MG tablet Take 1 tablet (40 mg total) by mouth daily. 90 tablet 0  . Eszopiclone 3 MG TABS Take 1 tablet (3 mg total) by mouth at bedtime as needed. Take immediately before bedtime 90 tablet 0  . ibuprofen (ADVIL,MOTRIN) 200 MG tablet Take 400 mg by mouth daily as needed for mild pain.     Marland Kitchen lamoTRIgine (LAMICTAL) 100 MG tablet Take 1 tablet (100 mg total) by mouth daily. 1 tablet 0  . levothyroxine (SYNTHROID, LEVOTHROID) 88 MCG tablet Take 88 mcg by mouth every evening.     . lisdexamfetamine (VYVANSE) 30 MG capsule Take 1 capsule (30 mg total) by mouth daily. 30 capsule 0  . lisdexamfetamine (VYVANSE) 30 MG capsule Take 1 capsule (30 mg total) by mouth daily. 30 capsule 0  . lurasidone (LATUDA) 20 MG TABS tablet Take 1 tablet (20 mg total) by mouth daily with breakfast. 90 tablet 0  . norethindrone-ethinyl estradiol (JINTELI) 1-5 MG-MCG TABS tablet Take 1 tablet by mouth every evening.    . pantoprazole (PROTONIX) 40 MG tablet Take 40 mg by mouth every evening.      No current facility-administered medications for this visit.      Musculoskeletal: Strength & Muscle Tone: within normal limits Gait & Station: normal Patient leans: N/A  Psychiatric Specialty  Exam: Review of Systems  Constitutional: Negative for chills, diaphoresis and fever.  Psychiatric/Behavioral: Positive for depression. Negative for hallucinations and suicidal ideas. The patient is nervous/anxious and has insomnia.     Blood pressure 110/64, pulse 66, height 5\' 4"  (1.626 m), weight 118 lb (53.5 kg), last menstrual period 06/03/2014.Body mass index is 20.25 kg/m.  General Appearance: Casual  Eye Contact:  Good  Speech:  Clear and Coherent and Normal Rate  Volume:  Normal  Mood:  Depressed  Affect:  Depressed and Tearful  Thought Process:  Goal Directed and Descriptions of Associations: Intact  Orientation:  Full (Time, Place, and Person)  Thought Content:  Logical  Suicidal Thoughts:  No  Homicidal Thoughts:  No  Memory:  Immediate;   Good  Judgement:  Good  Insight:  Good  Psychomotor Activity:  Normal  Concentration:  Concentration: Good and Attention Span: Good  Recall:  Good  Fund of Knowledge:  Good  Language:  Good  Akathisia:  No  Handed:  Right  AIMS (if indicated):     Assets:  Communication Skills Desire for Improvement Housing Transportation  ADL's:  Intact  Cognition:  WNL  Sleep:   poor       Screenings:  I reviewed the information below on 03/15/2018 and have updated at Assessment and Plan: MDD-recurrent, severe without psychotic features vs Bipolar II disorder; Insomnia; Bulimia Nervosa; Adjustment disorder with anxious mood      Medication management with supportive therapy. Risks/benefits and SE of the medication discussed. Pt verbalized understanding and verbal consent obtained for treatment.  Affirm with the patient that the medications are taken as ordered. Patient expressed understanding of how their medications were to be used.      Meds: Celexa 40mg  po qD for mood Lamictal 100mg   po qD for mood issues.-Unclear if her worsening depression symptoms have anything to do with increased Lamictal so will continue at 100 mg for  now Lunesta 3mg  po qHS prn insomnia- pt does not want her sleep aid changed Vyvanse 30mg  po qD for BED Xanax 0.5mg  po qD prn anxiety-advised her to take it during the daytime when she is experiencing anxiety symptoms Latuda 20mg  po qD for mood- higher doses cause TD symptoms    Labs: none   Therapy: brief supportive therapy provided. Discussed psychosocial stressors in detail.     Consultations: encouraged pt to continue therapy- her therapist is retired in July. Pt is now working with CBT trained therapist- she had her first appt today, next appt in 2 weeks -referred for TMS eval-I encouraged her to contact the provider for more information   Pt reports she is no longer experiencing any SI.  She is at an acute moderate risk for suicide. She wants to live for her son and has no hx of previous suicide attempts. Patient told to call clinic if any problems occur. Patient advised to go to ER if they should develop SI/HI, side effects, or if symptoms worsen. Has crisis numbers to call if needed. Pt verbalized understanding.   F/up in 2 week or sooner if needed-patient is aware of the crisis plan and states that she is not ready for inpatient psychiatric care at this point in time    Oletta Darter, MD 03/15/2018, 11:49 AM

## 2018-03-29 ENCOUNTER — Encounter (HOSPITAL_COMMUNITY): Payer: Self-pay | Admitting: Psychiatry

## 2018-03-29 ENCOUNTER — Ambulatory Visit (HOSPITAL_COMMUNITY): Payer: 59 | Admitting: Psychiatry

## 2018-03-29 DIAGNOSIS — F5105 Insomnia due to other mental disorder: Secondary | ICD-10-CM | POA: Diagnosis not present

## 2018-03-29 DIAGNOSIS — F411 Generalized anxiety disorder: Secondary | ICD-10-CM | POA: Diagnosis not present

## 2018-03-29 DIAGNOSIS — F331 Major depressive disorder, recurrent, moderate: Secondary | ICD-10-CM

## 2018-03-29 DIAGNOSIS — F5081 Binge eating disorder: Secondary | ICD-10-CM | POA: Diagnosis not present

## 2018-03-29 DIAGNOSIS — F99 Mental disorder, not otherwise specified: Secondary | ICD-10-CM

## 2018-03-29 MED ORDER — ESZOPICLONE 3 MG PO TABS: 3.0000 mg | ORAL_TABLET | Freq: Every evening | ORAL | 0 refills | Status: DC | PRN

## 2018-03-29 MED ORDER — LISDEXAMFETAMINE DIMESYLATE 30 MG PO CAPS: 30.0000 mg | ORAL_CAPSULE | Freq: Every day | ORAL | 0 refills | Status: DC

## 2018-03-29 MED ORDER — CITALOPRAM HYDROBROMIDE 40 MG PO TABS: 40.0000 mg | ORAL_TABLET | Freq: Every day | ORAL | 0 refills | Status: DC

## 2018-03-29 MED ORDER — ALPRAZOLAM 0.5 MG PO TABS: 0.5000 mg | ORAL_TABLET | Freq: Every evening | ORAL | 1 refills | Status: DC | PRN

## 2018-03-29 MED ORDER — LURASIDONE HCL 20 MG PO TABS: 20.0000 mg | ORAL_TABLET | Freq: Every day | ORAL | 0 refills | Status: DC

## 2018-03-29 NOTE — Progress Notes (Signed)
BH MD/PA/NP OP Progress Note  03/29/2018 9:56 AM Monica Jensen  MRN:  [id number]  Chief Complaint:  Chief Complaint    Depression; Follow-up     HPI: Patient arrived 7 minutes late for her 15-minute appointment today.  She was seen for 2-week follow-up.  Patient states that her depression is slowly improving.  It is not as overwhelming as before and she is crying less.  She remains stressed by several factors.  She is actively looking for a new job at this time.  She denies any SI/HI.  She is not sleeping well due to her work.  Her anxiety is present but she is only taking the Xanax when she can sleep for 6-8 hours at night.  She ends up taking it about 3 times a week.  She has not yet contacted a TMS provider but states that it is still something she is considering and will look into.  She really feels that the increased dose of Lamictal significantly contributed to her depression.  As of the dose has been decreased she has been feeling better.  Monica Jensen continues to have binging episodes about 2 times a week despite Vyvanse use.   Visit Diagnosis:    ICD-10-CM   1. Anxiety state F41.1 citalopram (CELEXA) 40 MG tablet    ALPRAZolam (XANAX) 0.5 MG tablet    DISCONTINUED: ALPRAZolam (XANAX) 0.5 MG tablet  2. Major depressive disorder, recurrent episode, moderate (HCC) F33.1 citalopram (CELEXA) 40 MG tablet    lurasidone (LATUDA) 20 MG TABS tablet  3. Insomnia due to other mental disorder F51.05 Eszopiclone 3 MG TABS   F99 DISCONTINUED: Eszopiclone 3 MG TABS  4. Binge eating disorder F50.81 lisdexamfetamine (VYVANSE) 30 MG capsule    lisdexamfetamine (VYVANSE) 30 MG capsule    DISCONTINUED: lisdexamfetamine (VYVANSE) 30 MG capsule      Past Psychiatric History: Anxiety: yes Bipolar Disorder: No Depression: Yes Mania: No  Psychosis: No  Schizophrenia: No  Personality Disorder: No  Hospitalization for psychiatric illness: Yes  History of Electroconvulsive Shock Therapy: No  Prior  Suicide Attempts: No   Past Medical History:  Past Medical History:  Diagnosis Date  . Anemia 15 YRS AGO, NONE RECENT  . Arthritis    LOWER BACK AND HIPS  . Broken foot    BOTH FEET LEFT FOOT CAST  . Complication of anesthesia    Epinephrine"tachycardia"-dentist  . Concussion with brief LOC   . COPD (chronic obstructive pulmonary disease) (HCC)    MILD  . Depression   . GERD (gastroesophageal reflux disease)   . Headache(784.0)    migraines  . Hip pain   . History of hiatal hernia   . History of Jensen stones    passed  . 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 Fears memory loss. Questions  About  Recent memory, loss related to [place].   . Hypothyroidism   . Memory loss HX OF WITH CONCUSSION  . Menopause   . Ovarian cyst   . TIA (transient ischemic attack)    SUSPECTED ABOUT 11 YEARS AGO    Past Surgical History:  Procedure Laterality Date  . ABDOMINAL SURGERY     x2(laparoscopic, 1 open), NO FALLOPIAN TUBES REMOVED WITH SURGERY  . APPENDECTOMY    . CESAREAN SECTION     X 1  . COLON SURGERY  1986   "blockage"colectomy  . COLONOSCOPY WITH PROPOFOL N/A 10/01/2013  Procedure: COLONOSCOPY WITH PROPOFOL;  Surgeon: Charolett BumpersMartin K Johnson, MD;  Location: [facility];  Service: Endoscopy;  Laterality: N/A;  . ESOPHAGOGASTRODUODENOSCOPY (EGD) WITH PROPOFOL N/A 09/26/2016   Procedure: ESOPHAGOGASTRODUODENOSCOPY (EGD) WITH PROPOFOL;  Surgeon: Charolett BumpersMartin K Johnson, MD;  Location: [facility];  Service: Endoscopy;  Laterality: N/A;  . HEMORROIDECTOMY    . HEMORROIDECTOMY     4x- last in Dec 2016  . MOUTH SURGERY  December 19, 2013   implant  . ORIF TOE FRACTURE Right 06/19/2014   Procedure: RIGHT FOOT OPEN REDUCTION INTERNAL FIXATION (ORIF) FIFTH METATARSAL ;  Surgeon: Loreta Aveaniel F Murphy, MD;  Location: [facility];  Service: Orthopedics;  Laterality: Right;    Family Psychiatric  Hx: Family History  Problem Relation Age of Onset  . Heart disease Maternal Grandfather   . Heart disease Maternal Grandmother   . Lung cancer Maternal Grandmother   . Anxiety disorder Sister   . Depression Sister   . Alcohol abuse Sister   . Anxiety disorder Cousin   . Alcohol abuse Cousin   . Depression Cousin        committed suicide  . Suicidality Cousin     Social History:  Social History   Socioeconomic History  . Marital status: Single    Spouse name: Not on file  . Number of children: 1  . Years of education: College  . Highest education level: Not on file  Occupational History    Employer: [employer]  Social Needs  . Financial resource strain: Not on file  . Food insecurity:    Worry: Not on file    Inability: Not on file  . Transportation needs:    Medical: Not on file    Non-medical: Not on file  Tobacco Use  . Smoking status: Current Some Day Smoker    Packs/day: 0.20    Years: 33.00    Pack years: 6.60    Types: Cigarettes  . Smokeless tobacco: Never Used  . Tobacco comment: Has cut back to 2-3 a day and trying to quit  Substance and Sexual Activity  . Alcohol use: Yes    Alcohol/week: 2.0 standard drinks    Types: 1 Glasses of wine, 1 Cans of beer per week    Comment: social- on weekends a couple of glasses of wine  . Drug use: No  . Sexual activity: Not Currently  Lifestyle  . Physical activity:    Days per week: Not on file    Minutes per session: Not on file  . Stress: Not on file  Relationships  . Social connections:    Talks on phone: Not on file    Gets together: Not on file    Attends religious service: Not on file    Active member of club or organization: Not on file    Attends meetings of clubs or organizations: Not on file    Relationship status: Not on file  Other Topics Concern  . Not on file  Social History Narrative   Patient lives with her fianceeLaure Jensen( Monica Jensen) and her son.   Patient has one child.   Patient has  college education.   Patient works at [employer] (full-time).   Patient is right-handed.   Patient drinks four cups of caffeine per day.    Allergies:  Allergies  Allergen Reactions  . Penicillins Anaphylaxis    Has patient had a PCN reaction causing immediate rash, facial/tongue/throat swelling, SOB or lightheadedness with hypotension: Yes Has  patient had a PCN reaction causing severe rash involving mucus membranes or skin necrosis: No Has patient had a PCN reaction that required hospitalization: No Has patient had a PCN reaction occurring within the last 10 years: No If all of the above answers are "NO", then may proceed with Cephalosporin use.   Izola Price Other (See Comments)    Headache  . Chocolate Other (See Comments)    Headache   . Other Other (See Comments)    MSG-Headache  . Prednisone Swelling    NAUSEA, STOMACH ISSUES  . Epinephrine Palpitations    Heart racing    Metabolic Disorder Labs: No results found for: HGBA1C, MPG No results found for: PROLACTIN No results found for: CHOL, TRIG, HDL, CHOLHDL, VLDL, LDLCALC No results found for: TSH  Therapeutic Level Labs: No results found for: LITHIUM No results found for: VALPROATE No components found for:  CBMZ  Current Medications: Current Outpatient Medications  Medication Sig Dispense Refill  . ALPRAZolam (XANAX) 0.5 MG tablet Take 1 tablet (0.5 mg total) by mouth at bedtime as needed for anxiety. 20 tablet 1  . citalopram (CELEXA) 40 MG tablet Take 1 tablet (40 mg total) by mouth daily. 90 tablet 0  . Eszopiclone 3 MG TABS Take 1 tablet (3 mg total) by mouth at bedtime as needed. Take immediately before bedtime 90 tablet 0  . ibuprofen (ADVIL,MOTRIN) 200 MG tablet Take 400 mg by mouth daily as needed for mild pain.     Marland Kitchen lamoTRIgine (LAMICTAL) 100 MG tablet Take 1 tablet (100 mg total) by mouth daily. 1 tablet 0  . levothyroxine (SYNTHROID, LEVOTHROID) 88 MCG tablet Take 88 mcg by mouth every evening.      . lisdexamfetamine (VYVANSE) 30 MG capsule Take 1 capsule (30 mg total) by mouth daily. 30 capsule 0  . lisdexamfetamine (VYVANSE) 30 MG capsule Take 1 capsule (30 mg total) by mouth daily. 30 capsule 0  . lurasidone (LATUDA) 20 MG TABS tablet Take 1 tablet (20 mg total) by mouth daily with breakfast. 90 tablet 0  . norethindrone-ethinyl estradiol (JINTELI) 1-5 MG-MCG TABS tablet Take 1 tablet by mouth every evening.    . pantoprazole (PROTONIX) 40 MG tablet Take 40 mg by mouth every evening.      No current facility-administered medications for this visit.      Musculoskeletal: Strength & Muscle Tone: within normal limits Gait & Station: normal Patient leans: N/A  Psychiatric Specialty Exam: Review of Systems  Constitutional: Negative for chills, diaphoresis and fever.  Respiratory: Negative for cough, sputum production and shortness of breath.     Last menstrual period 06/03/2014.There is no height or weight on file to calculate BMI.  General Appearance: Fairly Groomed and She is wearing recently applied nail polish on both her feet and hands  Eye Contact:  Good  Speech:  Clear and Coherent and Normal Rate  Volume:  Normal  Mood:  Anxious and Depressed  Affect:  Congruent and She appears to be calmer than at previous visit  Thought Process:  Goal Directed and Descriptions of Associations: Intact  Orientation:  Full (Time, Place, and Person)  Thought Content:  Logical  Suicidal Thoughts:  No  Homicidal Thoughts:  No  Memory:  Immediate;   Good  Judgement:  Good  Insight:  Good  Psychomotor Activity:  Normal  Concentration:  Concentration: Good  Recall:  Good  Fund of Knowledge:  Good  Language:  Good  Akathisia:  No  Handed:  Right  AIMS (if indicated):     Assets:  Communication Skills Desire for Improvement Financial Resources/Insurance Housing Social Support Talents/Skills Transportation  ADL's:  Intact  Cognition:  WNL  Sleep:          Screenings:  I  reviewed the information below on 03/29/2018 and have updated it Assessment and Plan: MDD-recurrent, severe without psychotic features vs Bipolar II disorder; Insomnia; Bulimia Nervosa; Adjustment disorder with anxious mood      Medication management with supportive therapy. Risks/benefits and SE of the medication discussed. Pt verbalized understanding and verbal consent obtained for treatment.  Affirm with the patient that the medications are taken as ordered. Patient expressed understanding of how their medications were to be used.      Meds: Celexa 40mg  po qD for mood Lamictal 100mg  po qD for mood issues.-Unclear if her worsening depression symptoms have anything to do with increased Lamictal so will continue at 100 mg for now Lunesta 3mg  po qHS prn insomnia- pt does not want her sleep aid changed Vyvanse 30mg  po qD for BED Xanax 0.5mg  po qD prn anxiety-advised her to take it during the daytime when she is experiencing anxiety symptoms Latuda 20mg  po qD for mood- higher doses cause TD symptoms  Patient has at a low risk for pregnancy due to the fact that she is postmenopausal.  Patient is aware that these medications carry up to her iatrogenic risk and will contact us if she does become pregnant.     Labs: none   Therapy: brief supportive therapy provided. Discussed psychosocial stressors in detail.     Consultations: encouraged pt to continue therapy- her therapist is retired in July. Pt is now working with CBT trained therapist and they will be meeting once a week -referred for TMS eval-  I encouraged her to contact the provider for more information   Pt reports she is no longer experiencing any SI.  She is at an acute moderate risk for suicide. She wants to live for her son and has no hx of previous suicide attempts. Patient told to call clinic if any problems occur. Patient advised to go to ER if they should develop SI/HI, side effects, or if symptoms worsen. Has crisis numbers to call  if needed. Pt verbalized understanding.   F/up in 4 week or sooner if needed    Oletta Darter, MD 03/29/2018, 9:56 AM

## 2018-05-03 ENCOUNTER — Ambulatory Visit (HOSPITAL_COMMUNITY): Payer: 59 | Admitting: Psychiatry

## 2018-05-21 ENCOUNTER — Telehealth (HOSPITAL_COMMUNITY): Payer: Self-pay

## 2018-05-21 DIAGNOSIS — F411 Generalized anxiety disorder: Secondary | ICD-10-CM

## 2018-05-21 MED ORDER — ALPRAZOLAM 0.5 MG PO TABS: 0.5000 mg | ORAL_TABLET | Freq: Every evening | ORAL | 1 refills | Status: DC | PRN

## 2018-05-21 NOTE — Telephone Encounter (Signed)
Patient is calling for refills on her Vyvanse. Her follow up is in January and she would like it sent to [facility].

## 2018-05-22 NOTE — Telephone Encounter (Signed)
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 Patient is compliant with appointments and prescriptions and sees Dr Michae KavaAgarwal again in January. Please review and advise, thank you

## 2018-05-24 ENCOUNTER — Other Ambulatory Visit (HOSPITAL_COMMUNITY): Payer: Self-pay | Admitting: Psychiatry

## 2018-05-24 DIAGNOSIS — F5081 Binge eating disorder: Secondary | ICD-10-CM

## 2018-05-24 MED ORDER — LISDEXAMFETAMINE DIMESYLATE 30 MG PO CAPS: 30.0000 mg | ORAL_CAPSULE | Freq: Every day | ORAL | 0 refills | Status: DC

## 2018-05-24 NOTE — Progress Notes (Signed)
Refilled her vyvanse

## 2018-07-19 ENCOUNTER — Ambulatory Visit (INDEPENDENT_AMBULATORY_CARE_PROVIDER_SITE_OTHER): Payer: 59 | Admitting: Psychiatry

## 2018-07-19 ENCOUNTER — Encounter (HOSPITAL_COMMUNITY): Payer: Self-pay | Admitting: Psychiatry

## 2018-07-19 ENCOUNTER — Encounter

## 2018-07-19 VITALS — BP 104/62 | HR 78 | Ht 63.0 in | Wt 112.0 lb

## 2018-07-19 DIAGNOSIS — F5105 Insomnia due to other mental disorder: Secondary | ICD-10-CM

## 2018-07-19 DIAGNOSIS — F411 Generalized anxiety disorder: Secondary | ICD-10-CM | POA: Diagnosis not present

## 2018-07-19 DIAGNOSIS — F331 Major depressive disorder, recurrent, moderate: Secondary | ICD-10-CM

## 2018-07-19 DIAGNOSIS — F99 Mental disorder, not otherwise specified: Secondary | ICD-10-CM

## 2018-07-19 DIAGNOSIS — F5081 Binge eating disorder: Secondary | ICD-10-CM | POA: Diagnosis not present

## 2018-07-19 MED ORDER — LISDEXAMFETAMINE DIMESYLATE 30 MG PO CAPS: 30.0000 mg | ORAL_CAPSULE | Freq: Every day | ORAL | 0 refills | Status: DC

## 2018-07-19 MED ORDER — ALPRAZOLAM 0.5 MG PO TABS: 0.5000 mg | ORAL_TABLET | Freq: Every evening | ORAL | 1 refills | Status: DC | PRN

## 2018-07-19 MED ORDER — LAMOTRIGINE 100 MG PO TABS: 100.0000 mg | ORAL_TABLET | Freq: Every day | ORAL | 0 refills | Status: DC

## 2018-07-19 MED ORDER — ESZOPICLONE 3 MG PO TABS: 3.0000 mg | ORAL_TABLET | Freq: Every evening | ORAL | 0 refills | Status: DC | PRN

## 2018-07-19 MED ORDER — LURASIDONE HCL 20 MG PO TABS: 20.0000 mg | ORAL_TABLET | Freq: Every day | ORAL | 0 refills | Status: DC

## 2018-07-19 MED ORDER — CITALOPRAM HYDROBROMIDE 40 MG PO TABS: 40.0000 mg | ORAL_TABLET | Freq: Every day | ORAL | 0 refills | Status: DC
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
## 2018-08-02 ENCOUNTER — Ambulatory Visit (INDEPENDENT_AMBULATORY_CARE_PROVIDER_SITE_OTHER): Payer: 59 | Admitting: Licensed Clinical Social Worker

## 2018-08-02 DIAGNOSIS — F332 Major depressive disorder, recurrent severe without psychotic features: Secondary | ICD-10-CM

## 2018-08-02 DIAGNOSIS — F4310 Post-traumatic stress disorder, unspecified: Secondary | ICD-10-CM | POA: Diagnosis not present
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
## 2018-08-20 ENCOUNTER — Ambulatory Visit (HOSPITAL_COMMUNITY): Payer: 59 | Admitting: Licensed Clinical Social Worker

## 2018-08-30 ENCOUNTER — Telehealth (HOSPITAL_COMMUNITY): Payer: Self-pay

## 2018-08-30 ENCOUNTER — Ambulatory Visit (HOSPITAL_COMMUNITY): Payer: 59 | Admitting: Psychiatry

## 2018-08-30 MED ORDER — LAMOTRIGINE 25 MG PO TABS: ORAL_TABLET | ORAL | 0 refills | Status: DC

## 2018-08-30 MED ORDER — DIVALPROEX SODIUM ER 250 MG PO TB24: ORAL_TABLET | ORAL | 0 refills | Status: DC

## 2018-08-30 NOTE — Telephone Encounter (Signed)
Very stressed  Zombie  Not sleeping  Very depressed  Crying all the time  Binging every couple of days- each episode lasts 48 hrs  +SI without plan or attempt  Nothing helping right now   Pt not sure if she should go to hospital but doesn't think so yet  Considering IOP  Seeing a new therapist- appt Monday  Start Depakote 250mg  for 3 days then go up to 500mg   Decrease Lamictal 75mg  for 5 days then go down 50mg   Follow up with me in 1 week

## 2018-08-30 NOTE — Telephone Encounter (Signed)
Patient accidentally slept through her appointment because she could not sleep last night. She was very tearful mand would like a call back

## 2018-09-03 ENCOUNTER — Ambulatory Visit (INDEPENDENT_AMBULATORY_CARE_PROVIDER_SITE_OTHER): Payer: 59 | Admitting: Licensed Clinical Social Worker

## 2018-09-03 DIAGNOSIS — F332 Major depressive disorder, recurrent severe without psychotic features: Secondary | ICD-10-CM | POA: Diagnosis not present

## 2018-09-03 NOTE — Progress Notes (Signed)
   THERAPIST PROGRESS NOTE  Session Time: 4:10pm- 4:55pm  Participation Level: Active  Behavioral Response: CasualAlertAnxious, Depressed and ; labile mood and affact, frequenty crying spells which abruptly start and stop  Type of Therapy: Individual Therapy  Treatment Goals addressed: Coping  Interventions: CBT, Motivational Interviewing and Supportive  Summary: Monica Jensen is a 53 y.o. female who presents in person with reported increase in depressive symptoms. Client reports she recently has had and increase in suicidal thoughts, and flashbacks related to the separation. Client endorses the possibility of needing a higher level of care and discusses with clinician benefits and barriers of IOP, PHP, and residential options. Client reports she would like to be out of town and verbalizes concern of people knowing she is in treatment. Client states she will be spending the night with her ex-husband and son to ensure her own safety. Client notes increase trouble with eating and sleeping patterns and would like a residential placement which could address that and the possibility of EMDR which she has researched.   Suicidal/Homicidal: reports recent passive SI, none current.without intent/plan  Therapist Response: Clinician met with client, assessing for SI/HI/psychosis and recent changes in stressors. Clinician validated client feeling overwhelmed. Clinician inquired about clients determination to interact with her ex-fiance, whom she has identified as a source of significant stress, while feeling overwhelmed. Clinician provided client with options of 'I-statements' to remain in tune with herself in case she needed to step away, as well as a way to focus on the topics needing discussed, rather than past events. Clinician reviewed safety plan with client and provided psycho-education on levels of care available.  Plan: Return again in 2 weeks.  Diagnosis: Axis I: Major Depression, Recurrent  severe; clt reports previous eating disorder symptoms becoming problematic due to increase in depressive symptoms.     Axis II: Cluster B Traits    Olegario Messier, LCSW 09/03/2018

## 2018-09-06 ENCOUNTER — Encounter (HOSPITAL_COMMUNITY): Payer: Self-pay | Admitting: Psychiatry

## 2018-09-06 ENCOUNTER — Ambulatory Visit (INDEPENDENT_AMBULATORY_CARE_PROVIDER_SITE_OTHER): Payer: 59 | Admitting: Psychiatry

## 2018-09-06 VITALS — BP 118/76 | HR 84 | Ht 62.75 in | Wt 118.0 lb

## 2018-09-06 DIAGNOSIS — F502 Bulimia nervosa: Secondary | ICD-10-CM

## 2018-09-06 DIAGNOSIS — F332 Major depressive disorder, recurrent severe without psychotic features: Secondary | ICD-10-CM | POA: Diagnosis not present

## 2018-09-06 NOTE — Progress Notes (Signed)
[facility] MD/PA/NP OP Progress Note  09/06/2018 1:03 PM Monica Jensen  MRN:  [id number]  Chief Complaint:  Chief Complaint    Depression; Anxiety     HPI: Monica Jensen has been feeling that her depression symptoms are getting worse.  She called me last week to tell me that she was feeling extremely depressed, crying all the time, having intense suicidal thoughts.  She was not sleeping well and was very fatigued.  At that point I recommended we start Depakote and cross taper off of Lamictal.  Since then she has followed up with her therapist.  Monica Jensen notes that the break-up with her ex-fianc was very hard on her.  That being combined with her sons mental health issues and feeling overwhelmed with work took a toll on her.  This past Monday she met with her ex and read him an 21 page letter that she wrote.  She feels that it brought her the closure that she is needed.  Since then her depression is ongoing but slightly better, she has not cried until today and she is no longer experiencing suicidal thoughts.  She has decided that she would benefit from going to a treatment center and has looked up several places around the country.  Monica Jensen reports that she went to 1 in her early 71s and spent 1 month there.  It was helpful.  Monica Jensen continues to have binging episodes.   Visit Diagnosis:    ICD-10-CM   1. Severe episode of recurrent major depressive disorder, without psychotic features ([place]) F33.2   2. Bulimia nervosa F50.2       Past Psychiatric History: Anxiety: yes Bipolar Disorder: No Depression: Yes Mania: No  Psychosis: No  Schizophrenia: No  Personality Disorder: No  Hospitalization for psychiatric illness: Yes  History of Electroconvulsive Shock Therapy: No  Prior Suicide Attempts: No   Past Medical History:  Past Medical History:  Diagnosis Date  . Anemia 15 YRS AGO, NONE RECENT  . Arthritis    LOWER BACK AND HIPS  . Broken foot    BOTH FEET LEFT FOOT CAST  . Complication of  anesthesia    Epinephrine"tachycardia"-dentist  . Concussion with brief LOC   . COPD (chronic obstructive pulmonary disease) (HCC)    MILD  . Depression   . GERD (gastroesophageal reflux disease)   . Headache(784.0)    migraines  . Hip pain   . History of hiatal hernia   . History of kidney stones    passed  . 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 Fears memory loss. Questions  About  Recent memory, loss related to [place].   . Hypothyroidism   . Memory loss HX OF WITH CONCUSSION  . Menopause   . Ovarian cyst   . TIA (transient ischemic attack)    SUSPECTED ABOUT 11 YEARS AGO    Past Surgical History:  Procedure Laterality Date  . ABDOMINAL SURGERY     x2(laparoscopic, 1 open), NO FALLOPIAN TUBES REMOVED WITH SURGERY  . APPENDECTOMY    . CESAREAN SECTION     X 1  . COLON SURGERY  1986   "blockage"colectomy  . COLONOSCOPY WITH PROPOFOL N/A 10/01/2013   Procedure: COLONOSCOPY WITH PROPOFOL;  Surgeon: Garlan Fair, MD;  Location: [facility];  Service: Endoscopy;  Laterality: N/A;  . ESOPHAGOGASTRODUODENOSCOPY (EGD) WITH PROPOFOL N/A 09/26/2016   Procedure: ESOPHAGOGASTRODUODENOSCOPY (EGD) WITH PROPOFOL;  Surgeon: Ursula Alert  Wynetta Emery, MD;  Location: [facility];  Service: Endoscopy;  Laterality: N/A;  . HEMORROIDECTOMY    . HEMORROIDECTOMY     4x- last in Dec 2016  . MOUTH SURGERY  December 19, 2013   implant  . ORIF TOE FRACTURE Right 06/19/2014   Procedure: RIGHT FOOT OPEN REDUCTION INTERNAL FIXATION (ORIF) FIFTH METATARSAL ;  Surgeon: Ninetta Lights, MD;  Location: [facility];  Service: Orthopedics;  Laterality: Right;    Family Psychiatric Hx: Family History  Problem Relation Age of Onset  . Heart disease Maternal Grandfather   . Heart disease Maternal Grandmother   . Lung cancer Maternal Grandmother   . Anxiety disorder Sister   . Depression Sister   .  Alcohol abuse Sister   . Anxiety disorder Cousin   . Alcohol abuse Cousin   . Depression Cousin        committed suicide  . Suicidality Cousin     Social History:  Social History   Socioeconomic History  . Marital status: Single    Spouse name: Not on file  . Number of children: 1  . Years of education: College  . Highest education level: Not on file  Occupational History    Employer: [employer]  Social Needs  . Financial resource strain: Not on file  . Food insecurity:    Worry: Not on file    Inability: Not on file  . Transportation needs:    Medical: Not on file    Non-medical: Not on file  Tobacco Use  . Smoking status: Current Some Day Smoker    Packs/day: 0.20    Years: 33.00    Pack years: 6.60    Types: Cigarettes  . Smokeless tobacco: Never Used  . Tobacco comment: Has cut back to 2-3 a day and trying to quit  Substance and Sexual Activity  . Alcohol use: Yes    Alcohol/week: 2.0 standard drinks    Types: 1 Glasses of wine, 1 Cans of beer per week    Comment: social- on weekends a couple of glasses of wine  . Drug use: No  . Sexual activity: Not Currently  Lifestyle  . Physical activity:    Days per week: Not on file    Minutes per session: Not on file  . Stress: Not on file  Relationships  . Social connections:    Talks on phone: Not on file    Gets together: Not on file    Attends religious service: Not on file    Active member of club or organization: Not on file    Attends meetings of clubs or organizations: Not on file    Relationship status: Not on file  Other Topics Concern  . Not on file  Social History Narrative   Patient lives with her fianceeDema Severin) and her son.   Patient has one child.   Patient has college education.   Patient works at [employer] (full-time).   Patient is right-handed.   Patient drinks four cups of caffeine per day.    Allergies:  Allergies  Allergen Reactions  . Penicillins Anaphylaxis    Has  patient had a PCN reaction causing immediate rash, facial/tongue/throat swelling, SOB or lightheadedness with hypotension: Yes Has patient had a PCN reaction causing severe rash involving mucus membranes or skin necrosis: No Has patient had a PCN reaction that required hospitalization: No Has patient had a PCN reaction occurring within the last 10 years: No If  all of the above answers are "NO", then may proceed with Cephalosporin use.   Elsie Amis Other (See Comments)    Headache  . Chocolate Other (See Comments)    Headache   . Other Other (See Comments)    MSG-Headache  . Prednisone Swelling    NAUSEA, STOMACH ISSUES  . Epinephrine Palpitations    Heart racing    Metabolic Disorder Labs: No results found for: HGBA1C, MPG No results found for: PROLACTIN No results found for: CHOL, TRIG, HDL, CHOLHDL, VLDL, LDLCALC No results found for: TSH  Therapeutic Level Labs: No results found for: LITHIUM No results found for: VALPROATE No components found for:  CBMZ  Current Medications: Current Outpatient Medications  Medication Sig Dispense Refill  . ALPRAZolam (XANAX) 0.5 MG tablet Take 1 tablet (0.5 mg total) by mouth at bedtime as needed for anxiety or sleep. 20 tablet 1  . citalopram (CELEXA) 40 MG tablet Take 1 tablet (40 mg total) by mouth daily. 90 tablet 0  . divalproex (DEPAKOTE ER) 250 MG 24 hr tablet Take 1 tablet (250 mg total) by mouth daily for 5 days, THEN 2 tablets (500 mg total) daily for 30 days. 50 tablet 0  . Eszopiclone 3 MG TABS Take 1 tablet (3 mg total) by mouth at bedtime as needed. Take immediately before bedtime 90 tablet 0  . ibuprofen (ADVIL,MOTRIN) 200 MG tablet Take 400 mg by mouth daily as needed for mild pain.     Marland Kitchen lamoTRIgine (LAMICTAL) 25 MG tablet Take 3 tablets (75 mg total) by mouth daily for 5 days, THEN 2 tablets (50 mg total) daily for 5 days, THEN 1 tablet (25 mg total) daily for 5 days. 30 tablet 0  . levothyroxine (SYNTHROID, LEVOTHROID) 88  MCG tablet Take 88 mcg by mouth every evening.     . lisdexamfetamine (VYVANSE) 30 MG capsule Take 1 capsule (30 mg total) by mouth daily. 30 capsule 0  . lisdexamfetamine (VYVANSE) 30 MG capsule Take 1 capsule (30 mg total) by mouth daily. 30 capsule 0  . lurasidone (LATUDA) 20 MG TABS tablet Take 1 tablet (20 mg total) by mouth daily with breakfast. 90 tablet 0  . norethindrone-ethinyl estradiol (JINTELI) 1-5 MG-MCG TABS tablet Take 1 tablet by mouth every evening.    . pantoprazole (PROTONIX) 40 MG tablet Take 40 mg by mouth every evening.      No current facility-administered medications for this visit.      Musculoskeletal: Strength & Muscle Tone: within normal limits Gait & Station: normal Patient leans: N/A   Psychiatric Specialty Exam: Review of Systems  Constitutional: Negative for chills, diaphoresis and fever.  Psychiatric/Behavioral: Positive for depression. Negative for suicidal ideas. The patient is nervous/anxious and has insomnia.     Blood pressure 118/76, pulse 84, height 5' 2.75" (1.594 m), weight 118 lb (53.5 kg), last menstrual period 06/03/2014, SpO2 95 %.Body mass index is 21.07 kg/m.  General Appearance: Casual  Eye Contact:  Good  Speech:  Clear and Coherent and Normal Rate  Volume:  Normal  Mood:  Anxious and Depressed  Affect:  Congruent  Thought Process:  Goal Directed and Descriptions of Associations: Intact  Orientation:  Full (Time, Place, and Person)  Thought Content:  Logical  Suicidal Thoughts:  No  Homicidal Thoughts:  No  Memory:  Immediate;   Good  Judgement:  Good  Insight:  Good  Psychomotor Activity:  Normal  Concentration:  Concentration: Good  Recall:  Good  Fund of Knowledge:  Good  Language:  Good  Akathisia:  No  Handed:  Right  AIMS (if indicated):     Assets:  Communication Skills Desire for Improvement Financial Resources/Insurance Housing Talents/Skills Transportation Vocational/Educational  ADL's:  Intact   Cognition:  WNL  Sleep:   slightly improved over last few days       Screenings:    I reviewed the information below on 09/06/2018 and have updated it Assessment and Plan: MDD-recurrent, severe without psychotic features vs Bipolar II disorder; Insomnia; Bulimia Nervosa; Adjustment disorder with anxious mood  Status of current symptoms: Severe depression, ongoing binging      Medication management with supportive therapy. Risks/benefits and SE of the medication discussed. Pt verbalized understanding and verbal consent obtained for treatment.  Affirm with the patient that the medications are taken as ordered. Patient expressed understanding of how their medications were to be used.      Meds: Celexa 57m po qD for mood Continue to taper off Lamictal 551mx 5 days then 2568mor 5 days then stop Increase Depakote 500m30m qHS for mood Lunesta 3mg 60mqHS prn insomnia- pt does not want her sleep aid changed Vyvanse 30mg 27mD for BED Xanax 0.5mg po58m prn anxiety-advised her to take it during the daytime when she is experiencing anxiety symptoms Latuda 20mg po62mfor mood- higher doses cause TD symptoms   Patient has at a low risk for pregnancy due to the fact that she is postmenopausal.  Patient is aware that these medications have a teratogenic risk and will contact us if [place] become pregnant.   -Patient reports in the past she attended a treatment center for anorexia and depression.  She is again looking for a treatment center focusing on trauma and depression symptoms.  She is identified a couple of places in various states and is looking into cost and insurance.  She feels that going to an inpatient psychiatric unit would be more detrimental to her.   Labs: none   Therapy: brief supportive therapy provided. Discussed psychosocial stressors in detail.     Consultations:  working with a therapist and seeing her about every 2 weeks -referred for TMS evalIcardt due to trauma  history she may not be an appropriate candidate -Patient is willing to consider ECT if needed.   Patient is endorsing SI without plan or intent that have improved over the last 3 days.  She is at an acute moderate risk for suicide. She wants to live for her son and has no hx of previous suicide attempts. Patient told to call clinic if any problems occur. Patient advised to go to ER if they should develop SI/HI, side effects, or if symptoms worsen. Has crisis numbers to call if needed. Pt verbalized understanding.   F/up in 1week or sooner if needed    Manessa Buley ACharlcie Cradle7/2020, 1:03 PM

## 2018-09-13 ENCOUNTER — Encounter (HOSPITAL_COMMUNITY): Payer: Self-pay | Admitting: Psychiatry

## 2018-09-13 ENCOUNTER — Ambulatory Visit (INDEPENDENT_AMBULATORY_CARE_PROVIDER_SITE_OTHER): Payer: 59 | Admitting: Psychiatry

## 2018-09-13 VITALS — BP 161/92 | HR 95 | Ht 63.5 in | Wt 105.0 lb

## 2018-09-13 DIAGNOSIS — F331 Major depressive disorder, recurrent, moderate: Secondary | ICD-10-CM

## 2018-09-13 DIAGNOSIS — F5081 Binge eating disorder: Secondary | ICD-10-CM

## 2018-09-13 DIAGNOSIS — F411 Generalized anxiety disorder: Secondary | ICD-10-CM

## 2018-09-13 DIAGNOSIS — F5105 Insomnia due to other mental disorder: Secondary | ICD-10-CM | POA: Diagnosis not present

## 2018-09-13 DIAGNOSIS — F99 Mental disorder, not otherwise specified: Secondary | ICD-10-CM

## 2018-09-13 MED ORDER — LISDEXAMFETAMINE DIMESYLATE 40 MG PO CAPS: 40.0000 mg | ORAL_CAPSULE | Freq: Every day | ORAL | 0 refills | Status: DC

## 2018-09-13 NOTE — Progress Notes (Signed)
[facility] MD/PA/NP OP Progress Note  09/13/2018 3:48 PM Monica Jensen  MRN:  [id number]  Chief Complaint:  Chief Complaint    Depression     HPI: Maranatha shares that her depression seems to have improved a little bit since we last met 1 week ago.  She believes that this is due in part to getting some closure from her ex.  Today she tells me that she is not crying all the time and she has not experienced any suicidal thoughts since about 1 week ago.  She has been cross tapering off the Lamictal and has increased the Depakote to 500 mg.  She reports that she has tolerated all of that well.  She is concerned because she continues to binge.  She had an episode where she binged for 3 days and yesterday she purged.  It does not seem to be improving.  She is sleeping well with the combination of Depakote, Lunesta and Xanax.  She states that the sleep is very deep and she has missed her alarm a couple of times.  I advised her to taper off her nighttime dose of Xanax over the course of 1 week to see if that improves.  She is still considering residential treatment centers.  Her son is starting [place] tomorrow and she wants to wait to see how he handles that before making any decisions.  She notes that she has been grinding her teeth a lot and wonders if it has to do with Latuda.   Visit Diagnosis:    ICD-10-CM   1. Major depressive disorder, recurrent episode, moderate (HCC) F33.1   2. Binge eating disorder F50.81 lisdexamfetamine (VYVANSE) 40 MG capsule  3. Anxiety state F41.1   4. Insomnia due to other mental disorder F51.05    F99       Past Psychiatric History: Anxiety: yes Bipolar Disorder: No Depression: Yes Mania: No  Psychosis: No  Schizophrenia: No  Personality Disorder: No  Hospitalization for psychiatric illness: Yes  History of Electroconvulsive Shock Therapy: No  Prior Suicide Attempts: No   Past Medical History:  Past Medical History:  Diagnosis Date  . Anemia 15 YRS AGO, NONE  RECENT  . Arthritis    LOWER BACK AND HIPS  . Broken foot    BOTH FEET LEFT FOOT CAST  . Complication of anesthesia    Epinephrine"tachycardia"-dentist  . Concussion with brief LOC   . COPD (chronic obstructive pulmonary disease) (HCC)    MILD  . Depression   . GERD (gastroesophageal reflux disease)   . Headache(784.0)    migraines  . Hip pain   . History of hiatal hernia   . History of kidney stones    passed  . 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 Fears memory loss. Questions  About  Recent memory, loss related to [place].   . Hypothyroidism   . Memory loss HX OF WITH CONCUSSION  . Menopause   . Ovarian cyst   . TIA (transient ischemic attack)    SUSPECTED ABOUT 11 YEARS AGO    Past Surgical History:  Procedure Laterality Date  . ABDOMINAL SURGERY     x2(laparoscopic, 1 open), NO FALLOPIAN TUBES REMOVED WITH SURGERY  . APPENDECTOMY    . CESAREAN SECTION     X 1  . COLON SURGERY  1986   "blockage"colectomy  . COLONOSCOPY WITH PROPOFOL N/A 10/01/2013   Procedure: COLONOSCOPY  WITH PROPOFOL;  Surgeon: Garlan Fair, MD;  Location: [facility];  Service: Endoscopy;  Laterality: N/A;  . ESOPHAGOGASTRODUODENOSCOPY (EGD) WITH PROPOFOL N/A 09/26/2016   Procedure: ESOPHAGOGASTRODUODENOSCOPY (EGD) WITH PROPOFOL;  Surgeon: Garlan Fair, MD;  Location: [facility];  Service: Endoscopy;  Laterality: N/A;  . HEMORROIDECTOMY    . HEMORROIDECTOMY     4x- last in Dec 2016  . MOUTH SURGERY  December 19, 2013   implant  . ORIF TOE FRACTURE Right 06/19/2014   Procedure: RIGHT FOOT OPEN REDUCTION INTERNAL FIXATION (ORIF) FIFTH METATARSAL ;  Surgeon: Ninetta Lights, MD;  Location: [facility];  Service: Orthopedics;  Laterality: Right;    Family Psychiatric Hx: Family History  Problem Relation Age of Onset  . Heart disease Maternal Grandfather   . Heart disease Maternal  Grandmother   . Lung cancer Maternal Grandmother   . Anxiety disorder Sister   . Depression Sister   . Alcohol abuse Sister   . Anxiety disorder Cousin   . Alcohol abuse Cousin   . Depression Cousin        committed suicide  . Suicidality Cousin     Social History:  Social History   Socioeconomic History  . Marital status: Single    Spouse name: Not on file  . Number of children: 1  . Years of education: College  . Highest education level: Not on file  Occupational History    Employer: [employer]  Social Needs  . Financial resource strain: Not on file  . Food insecurity:    Worry: Not on file    Inability: Not on file  . Transportation needs:    Medical: Not on file    Non-medical: Not on file  Tobacco Use  . Smoking status: Current Some Day Smoker    Packs/day: 0.20    Years: 33.00    Pack years: 6.60    Types: Cigarettes  . Smokeless tobacco: Never Used  . Tobacco comment: Has cut back to 2-3 a day and trying to quit  Substance and Sexual Activity  . Alcohol use: Yes    Alcohol/week: 2.0 standard drinks    Types: 1 Glasses of wine, 1 Cans of beer per week    Comment: social- on weekends a couple of glasses of wine  . Drug use: No  . Sexual activity: Not Currently  Lifestyle  . Physical activity:    Days per week: Not on file    Minutes per session: Not on file  . Stress: Not on file  Relationships  . Social connections:    Talks on phone: Not on file    Gets together: Not on file    Attends religious service: Not on file    Active member of club or organization: Not on file    Attends meetings of clubs or organizations: Not on file    Relationship status: Not on file  Other Topics Concern  . Not on file  Social History Narrative   Patient lives with her fianceeDema Severin) and her son.   Patient has one child.   Patient has college education.   Patient works at [employer] (full-time).   Patient is right-handed.   Patient drinks four cups  of caffeine per day.    Allergies:  Allergies  Allergen Reactions  . Penicillins Anaphylaxis    Has patient had a PCN reaction causing immediate rash, facial/tongue/throat swelling, SOB or lightheadedness with hypotension: Yes Has patient had  a PCN reaction causing severe rash involving mucus membranes or skin necrosis: No Has patient had a PCN reaction that required hospitalization: No Has patient had a PCN reaction occurring within the last 10 years: No If all of the above answers are "NO", then may proceed with Cephalosporin use.   Elsie Amis Other (See Comments)    Headache  . Chocolate Other (See Comments)    Headache   . Other Other (See Comments)    MSG-Headache  . Prednisone Swelling    NAUSEA, STOMACH ISSUES  . Epinephrine Palpitations    Heart racing    Metabolic Disorder Labs: No results found for: HGBA1C, MPG No results found for: PROLACTIN No results found for: CHOL, TRIG, HDL, CHOLHDL, VLDL, LDLCALC No results found for: TSH  Therapeutic Level Labs: No results found for: LITHIUM No results found for: VALPROATE No components found for:  CBMZ  Current Medications: Current Outpatient Medications  Medication Sig Dispense Refill  . ALPRAZolam (XANAX) 0.5 MG tablet Take 1 tablet (0.5 mg total) by mouth at bedtime as needed for anxiety or sleep. 20 tablet 1  . citalopram (CELEXA) 40 MG tablet Take 1 tablet (40 mg total) by mouth daily. 90 tablet 0  . divalproex (DEPAKOTE ER) 250 MG 24 hr tablet Take 1 tablet (250 mg total) by mouth daily for 5 days, THEN 2 tablets (500 mg total) daily for 30 days. 50 tablet 0  . Eszopiclone 3 MG TABS Take 1 tablet (3 mg total) by mouth at bedtime as needed. Take immediately before bedtime 90 tablet 0  . ibuprofen (ADVIL,MOTRIN) 200 MG tablet Take 400 mg by mouth daily as needed for mild pain.     Marland Kitchen levothyroxine (SYNTHROID, LEVOTHROID) 88 MCG tablet Take 88 mcg by mouth every evening.     . lisdexamfetamine (VYVANSE) 40 MG capsule  Take 1 capsule (40 mg total) by mouth daily. 30 capsule 0  . lurasidone (LATUDA) 20 MG TABS tablet Take 1 tablet (20 mg total) by mouth daily with breakfast. 90 tablet 0  . norethindrone-ethinyl estradiol (JINTELI) 1-5 MG-MCG TABS tablet Take 1 tablet by mouth every evening.    . pantoprazole (PROTONIX) 40 MG tablet Take 40 mg by mouth every evening.      No current facility-administered medications for this visit.      Musculoskeletal: Strength & Muscle Tone: within normal limits Gait & Station: normal Patient leans: N/A  Psychiatric Specialty Exam: Review of Systems  Constitutional: Negative for chills, diaphoresis and fever.  Respiratory: Negative for cough, sputum production and shortness of breath.     Blood pressure (!) 161/92, pulse 95, height 5' 3.5" (1.613 m), weight 105 lb (47.6 kg), last menstrual period 06/03/2014.Body mass index is 18.31 kg/m.  General Appearance: Casual  Eye Contact:  Good  Speech:  Clear and Coherent and Normal Rate  Volume:  Normal  Mood:  Depressed  Affect:  Full Range  Thought Process:  Goal Directed and Descriptions of Associations: Intact  Orientation:  Full (Time, Place, and Person)  Thought Content:  Logical  Suicidal Thoughts:  No  Homicidal Thoughts:  No  Memory:  Immediate;   Good  Judgement:  Good  Insight:  Good  Psychomotor Activity:  Normal  Concentration:  Concentration: Good  Recall:  Good  Fund of Knowledge:  Good  Language:  Good  Akathisia:  No  Handed:  Right  AIMS (if indicated):     Assets:  Communication Skills Desire for Improvement Financial Resources/Insurance Housing Resilience  Social Support Heritage manager  ADL's:  Intact  Cognition:  WNL  Sleep:   good         Screenings:   I reviewed the information below on 09/13/2018 and have updated it Assessment and Plan: MDD-recurrent, severe without psychotic features vs Bipolar II disorder; Insomnia; Bulimia  Nervosa; Adjustment disorder with anxious mood  Status of current symptoms: depression is improving, ongoing binging     Medication management with supportive therapy. Risks/benefits and SE of the medication discussed. Pt verbalized understanding and verbal consent obtained for treatment.  Affirm with the patient that the medications are taken as ordered. Patient expressed understanding of how their medications were to be used.      Meds: Celexa 52m po qD for mood Continue to taper off Lamictal 246mfor 5 days then stop Depakote 50022mo qHS for mood Lunesta 3mg78m qHS prn insomnia- pt does not want her sleep aid changed Vyvanse 30mg61mqD for BED Xanax 0.5mg p7mD prn anxiety-advised her to take it during the daytime when she is experiencing anxiety symptoms Latuda 20mg p47m for mood- higher doses cause TD symptoms   Patient has at a low risk for pregnancy due to the fact that she is postmenopausal.  Patient is aware that these medications have a teratogenic risk and will contact us if [place] does become pregnant.   -Patient reports in the past she attended a treatment center for anorexia and depression.  She is again looking for a treatment center focusing on trauma and depression symptoms.  She is identified a couple of places [place] and is looking into cost and insurance.  She feels that going to an inpatient psychiatric unit would be more detrimental to her.   Labs: none   Therapy: brief supportive therapy provided. Discussed psychosocial stressors in detail.     Consultations:  working with a therapist and seeing her about every 2 weeks -[place] due to trauma history she may not be an appropriate candidate -Patient is willing to consider ECT if needed.   Patient is endorsing SI without plan or intent that have improved over the last 5 days.  She is at an acute moderate risk for suicide. She wants to live for her son and has no hx of previous suicide  attempts. Patient told to call clinic if any problems occur. Patient advised to go to ER if they should develop SI/HI, side effects, or if symptoms worsen. Has crisis numbers to call if needed. Pt verbalized understanding.   F/up in 3 week or sooner if needed     Charlcie Cradle5/2020, 3:48 PM

## 2018-09-18 ENCOUNTER — Telehealth (HOSPITAL_COMMUNITY): Payer: Self-pay

## 2018-09-18 ENCOUNTER — Ambulatory Visit (INDEPENDENT_AMBULATORY_CARE_PROVIDER_SITE_OTHER): Payer: 59 | Admitting: Licensed Clinical Social Worker

## 2018-09-18 DIAGNOSIS — F331 Major depressive disorder, recurrent, moderate: Secondary | ICD-10-CM

## 2018-09-18 NOTE — Telephone Encounter (Signed)
Patient was here today for therapy, she cam in and said that once she increased to 2 Depakote today she started having swelling in her hands and nausea really bad. Please review and advise, thank you

## 2018-09-18 NOTE — Progress Notes (Signed)
   THERAPIST PROGRESS NOTE  Session Time: 3:05pm-3:55pm  Participation Level: Active  Behavioral Response: CasualAlertAnxious, Depressed and fluctuating based on topic discussed  Type of Therapy: Individual Therapy  Treatment Goals addressed: Coping  Interventions: CBT, Motivational Interviewing and Supportive  Summary: Monica Jensen is a 53 y.o. female who presents face to face with major depressive disorder. Client endorses some relief after receiving closure after speaking with her ex. Client notes she is still debating a residential placement to address trauma and eating disorder and was receptive to referral to Alvis Lemmings, LCSW, a local therapist specializing in eating disorders. Client processed with clinician working on re-identifying herself now and her anxiety related to 'starting over' at her age. Client reports she does plan to try and re-engage in hobbies which she previously enjoyed, including playing the piano, reading, and completing puzzles. Client denies active SI/HI/psychosis and inquired about brainspotting and [place] and treatments for depression and trauma.  Suicidal/Homicidal: Nowithout intent/plan  Therapist Response: Clinician met with client face to face, assessing for SI/HI/pscyhosis and overall level of functioning. Clinician inquired about changes in recent stressors. Clinician actively listened, providing reflective statements and validating client feelings related to finding her authentic self. Clinician presented the topic of Mindfulness as a daily practice to address symptoms of depression and anxiety. Clinician and client processed moments where mindfulness could be added into client day, including when on walks with her dog. Clinician provided education on the importance of addressing eating disorder symptosm with a professional trained in that area and offered referral for local provider.  Plan: Return again in 2-3 weeks, pending engagement with H. Kitchen,  LCSW  Diagnosis: Axis I: Major Depression, Recurrent severe    Axis II: Borderline Personality Dis.    Olegario Messier, LCSW 09/18/2018

## 2018-09-20 NOTE — Telephone Encounter (Signed)
She should decrease to 1 tab a day and monitor for improvement. If not improvement after 4-5 days then stop med and call us

## 2018-09-25 NOTE — Telephone Encounter (Signed)
Okay, I called the patient and let her know, she was agreeable to the plan.

## 2018-10-04 ENCOUNTER — Ambulatory Visit (INDEPENDENT_AMBULATORY_CARE_PROVIDER_SITE_OTHER): Payer: 59 | Admitting: Psychiatry

## 2018-10-04 ENCOUNTER — Other Ambulatory Visit: Payer: Self-pay

## 2018-10-04 DIAGNOSIS — F331 Major depressive disorder, recurrent, moderate: Secondary | ICD-10-CM | POA: Diagnosis not present

## 2018-10-04 DIAGNOSIS — F5081 Binge eating disorder: Secondary | ICD-10-CM | POA: Diagnosis not present

## 2018-10-04 DIAGNOSIS — F5105 Insomnia due to other mental disorder: Secondary | ICD-10-CM

## 2018-10-04 DIAGNOSIS — F99 Mental disorder, not otherwise specified: Secondary | ICD-10-CM

## 2018-10-04 DIAGNOSIS — F411 Generalized anxiety disorder: Secondary | ICD-10-CM

## 2018-10-04 MED ORDER — ALPRAZOLAM 0.5 MG PO TABS: 0.5000 mg | ORAL_TABLET | Freq: Every evening | ORAL | 1 refills | Status: DC | PRN

## 2018-10-04 MED ORDER — CITALOPRAM HYDROBROMIDE 40 MG PO TABS: 40.0000 mg | ORAL_TABLET | Freq: Every day | ORAL | 0 refills | Status: DC

## 2018-10-04 MED ORDER — LURASIDONE HCL 20 MG PO TABS: 20.0000 mg | ORAL_TABLET | Freq: Every day | ORAL | 0 refills | Status: DC

## 2018-10-04 MED ORDER — ESZOPICLONE 3 MG PO TABS: 3.0000 mg | ORAL_TABLET | Freq: Every evening | ORAL | 0 refills | Status: DC | PRN

## 2018-10-04 MED ORDER — LISDEXAMFETAMINE DIMESYLATE 40 MG PO CAPS: 40.0000 mg | ORAL_CAPSULE | Freq: Every day | ORAL | 0 refills | Status: DC

## 2018-10-04 MED ORDER — DIVALPROEX SODIUM ER 500 MG PO TB24: 500.0000 mg | ORAL_TABLET | Freq: Every day | ORAL | 1 refills | Status: DC

## 2018-10-04 NOTE — Progress Notes (Signed)
Virtual Visit via Telephone Note  I connected with Monica Jensen on 10/04/18 at  8:15 AM EDT by telephone and verified that I am speaking with the correct person using two identifiers.   I discussed the limitations, risks, security and privacy concerns of performing an evaluation and management service by telephone and the availability of in person appointments. I also discussed with the patient that there may be a patient responsible charge related to this service. The patient expressed understanding and agreed to proceed.   History of Present Illness: I spoke with Monica Jensen on the phone today.  She tells me that she is having some depression.  It is not as concerning has it was several months ago.  She is very worried that she could have a worsening of symptoms due to following the safety precautions of isolating during the coronavirus pandemic.  She states that her sleep is fair although she is sleeping much later than she usually does.  She believes this is due to being at home more.  She denies any manic or hypomanic-like symptoms.  She denies any SI/HI.  Overall she said that things have been going well but about 3 days ago she saw her ex boyfriend girlfriend at their old home.  This caused her to mange for 3 days.  She also admits that she purged.  For this reason she is thinking about going to a residential treatment center.  She [facility] but was told that they are closed now due to the pandemic.  She really wants to go to address her eating disorder.  We discussed the pros and cons of going to a residential treatment center at this time.  Due to restrictions on gatherings and social distancing she may not experience the same quality of care as she would at another time.  Patient states that she did not consider that earlier.  Her anxiety is manageable.  Her work is calm down which has been good for her.  She tells me that she had a gynecological problem around the same time that  she started the increased dose of Depakote.  She has been in menopause for 6 years but suddenly developed..  Along with swelling and nausea.  At this point she is uncertain if that reaction was due to increasing the Depakote or her hormones.  She is willing to try an increase in the Depakote again and will monitor for side effects.  Patient also tells me that she was referred to [facility] for eating disorder therapy.  She has contacted the therapist several times but has no response yet.  She understands that the current situation is affecting peoples schedules and will wait until next week to find someone else.   Observations/Objective: [name] is calm, pleasant and cooperative on the phone.  Her speech is spontaneous and clear.  She has a normal rate, tone and volume.  Mood is depressed and affect is full range.  Thought processes are goal directed and intact.  Thought content is logical.  She denies suicidal and homicidal ideations.  She does not appear to be responding to internal stimuli.  Concentration, attention and memory are good.  Fund of knowledge and language use are good.  Insight is good and judgment is fair.  I am unable to comment on General appearance, eye contact or psychomotor activity as I was unable to physically see the patient.  Assessment and Plan: MDD-recurrent, severe without psychotic features versus bipolar 2 disorder;  bulimia nervosa; insomnia  Increase Depakote to 500 mg p.o. nightly for mood.  Patient will monitor for side effects and call.   Continue : Celexa 40 mg p.o. daily for mood Lunesta 3 mg p.o. nightly as needed insomnia Vyvanse 30 mg p.o. daily for binge eating disorder Xanax 0.5 mg p.o. daily as needed anxiety Latuda 20 mg p.o. daily for mood  Follow Up Instructions:  Follow-up on April 30 at 9 AM    I discussed the assessment and treatment plan with the patient. The patient was provided an opportunity to ask questions and all were answered. The  patient agreed with the plan and demonstrated an understanding of the instructions.   The patient was advised to call back or seek an in-person evaluation if the symptoms worsen or if the condition fails to improve as anticipated.  I provided 30 minutes of non-face-to-face time during this encounter.   Oletta Darter, MD

## 2018-10-10 ENCOUNTER — Ambulatory Visit (HOSPITAL_COMMUNITY): Payer: 59 | Admitting: Licensed Clinical Social Worker

## 2018-11-01 ENCOUNTER — Telehealth (HOSPITAL_COMMUNITY): Payer: Self-pay | Admitting: Psychiatry

## 2018-11-08 ENCOUNTER — Ambulatory Visit (HOSPITAL_COMMUNITY): Payer: 59 | Admitting: Psychiatry

## 2019-01-31 ENCOUNTER — Other Ambulatory Visit: Payer: Self-pay

## 2019-01-31 ENCOUNTER — Encounter (HOSPITAL_COMMUNITY): Payer: Self-pay | Admitting: Psychiatry

## 2019-01-31 ENCOUNTER — Ambulatory Visit (INDEPENDENT_AMBULATORY_CARE_PROVIDER_SITE_OTHER): Payer: 59 | Admitting: Psychiatry

## 2019-01-31 DIAGNOSIS — F502 Bulimia nervosa: Secondary | ICD-10-CM

## 2019-01-31 DIAGNOSIS — F4310 Post-traumatic stress disorder, unspecified: Secondary | ICD-10-CM

## 2019-01-31 DIAGNOSIS — F5105 Insomnia due to other mental disorder: Secondary | ICD-10-CM | POA: Diagnosis not present

## 2019-01-31 DIAGNOSIS — F99 Mental disorder, not otherwise specified: Secondary | ICD-10-CM

## 2019-01-31 DIAGNOSIS — F332 Major depressive disorder, recurrent severe without psychotic features: Secondary | ICD-10-CM

## 2019-01-31 MED ORDER — MIRTAZAPINE 30 MG PO TABS: 30.0000 mg | ORAL_TABLET | Freq: Every day | ORAL | 1 refills | Status: DC

## 2019-01-31 MED ORDER — VIIBRYD 40 MG PO TABS: 40.0000 mg | ORAL_TABLET | Freq: Every day | ORAL | 1 refills | Status: DC

## 2019-01-31 NOTE — Progress Notes (Signed)
Virtual Visit via Telephone Note  I connected with Monica Jensen on 01/31/19 at  2:45 PM EDT by telephone and verified that I am speaking with the correct person using two identifiers.  Location: Patient: home Provider: office   I discussed the limitations, risks, security and privacy concerns of performing an evaluation and management service by telephone and the availability of in person appointments. I also discussed with the patient that there may be a patient responsible charge related to this service. The patient expressed understanding and agreed to proceed.   History of Present Illness: "I am hanging in there". Pt did 7 weeks at a residential treatment program for eating disorder and trauma units in [name]. It was intense. She could have stayed longer. She was in a bad place when she left the program. From there she went to a PHP program in [name] for 2 weeks. Pt has been home for 2 weeks and regrets pushing to come back home. She was doing well in [name] and is unhappy that she has to isolate at home. It was so good to be in a supportive and nurturing environment.  "To come back to this CRAP has been hard. I have been struggling a lot since I came back home.  Everything in [name] is a trauma trigger". Pt states the isolation is the hardest part to deal with because she needs human contact. Pt went back to work one week and has a IT consultantnew supervisor who is very supportive. unfortunately her job completely changed while she was gone. She is overwhelmed. Since coming home pt has had a few episodes of binging. Pt is aware that she is able to come back together quicker. Pt is working with 2 therapist and one is going to write her out of work for 2 weeks. Pt wants to move to [name] and has loved being there for 35 yrs. Pt feels that [place] is no longer her home. She feels so out of sorts. Pt states trazodone is not sleeping even with Trazodone. It makes her anxiety worse when she can't sleep.  Her therapist told her restart Lunesta last week. Pt has taken it several times since then. Pt states meds were helping in treatment but now anymore that she is home.    Observations/Objective: I spoke with Monica Jensen on the phone.  Pt was cooperative.  Pt was engaged in the conversation and answered questions appropriately.  Speech was clear and coherent with normal rate, tone and volume.  Mood is depressed and anxious, affect is congruent and she was crying thru out the session. Thought processes are coherent and circumstantial. Thought content is with ruminations.  Pt denies SI/HI.   Pt denies auditory and visual hallucinations and did not appear to be responding to internal stimuli.  Memory and concentration are good.  Fund of knowledge and use of language are average.  Insight and judgment are fair.  I am unable to comment on psychomotor activity, general appearance, hygiene, or eye contact as I was unable to physically see the patient on the phone.  Vital signs not available since interview conducted virtually.    Assessment and Plan: MDD- recurrent, severe without psychotic features vs Bipolar II d/o; Bulimia Nervosa; Insomnia; PTSD; r/o Borderline PD  D/c Depakote 500mg  po qHS D/c Celexa 40mg  qD D/c Lunesta 3mg  qhs prn D/c Vyvanse 30mg  po qD D/c Xanax 0.5mg  po qD prn anxiety D/c Latuda 20mg  po qD  all above were [facility] in [name]  She  is now taking Viibryd 40mg  Trazodone 100mg  qHS prn- ineffective Suggested she try Pristiq if Viibrdy was ineffective by the PHP.   Trial with Remeron was good but then d/c for unknown reasons.  ---- Plan--  Continue Viibryd 40mg  po qD Restart Remeron 30mg  po qHS D/c Lunesta D/c Trazodone  Pt is working with 2 therapist who she has virtual visits 3x/week- Production assistant, radio for eating disorder and Serita Butcher for CBT/DBT  Pt will have her records from [name] sent here  Follow Up Instructions: In 4 weeks or sooner if needed   I  discussed the assessment and treatment plan with the patient. The patient was provided an opportunity to ask questions and all were answered. The patient agreed with the plan and demonstrated an understanding of the instructions.   The patient was advised to call back or seek an in-person evaluation if the symptoms worsen or if the condition fails to improve as anticipated.  I provided 65 minutes of non-face-to-face time during this encounter.   Charlcie Cradle, MD

## 2019-03-07 ENCOUNTER — Encounter (HOSPITAL_COMMUNITY): Payer: Self-pay | Admitting: Psychiatry

## 2019-03-07 ENCOUNTER — Ambulatory Visit (INDEPENDENT_AMBULATORY_CARE_PROVIDER_SITE_OTHER): Payer: 59 | Admitting: Psychiatry

## 2019-03-07 ENCOUNTER — Other Ambulatory Visit: Payer: Self-pay

## 2019-03-07 DIAGNOSIS — F4310 Post-traumatic stress disorder, unspecified: Secondary | ICD-10-CM

## 2019-03-07 DIAGNOSIS — F332 Major depressive disorder, recurrent severe without psychotic features: Secondary | ICD-10-CM | POA: Diagnosis not present

## 2019-03-07 DIAGNOSIS — F99 Mental disorder, not otherwise specified: Secondary | ICD-10-CM

## 2019-03-07 DIAGNOSIS — F502 Bulimia nervosa: Secondary | ICD-10-CM

## 2019-03-07 DIAGNOSIS — F5105 Insomnia due to other mental disorder: Secondary | ICD-10-CM

## 2019-03-07 MED ORDER — MIRTAZAPINE 15 MG PO TABS: 22.5000 mg | ORAL_TABLET | Freq: Every day | ORAL | 0 refills | Status: DC

## 2019-03-07 MED ORDER — VIIBRYD 40 MG PO TABS: 40.0000 mg | ORAL_TABLET | Freq: Every day | ORAL | 0 refills | Status: DC

## 2019-03-07 NOTE — Progress Notes (Signed)
Virtual Visit via Video Note  I connected with Rhys Martini on 03/07/19 at  7:30 AM EDT by a video enabled telemedicine application and verified that I am speaking with the correct person using two identifiers.  Location: Patient: home Provider: office   I discussed the limitations of evaluation and management by telemedicine and the availability of in person appointments. The patient expressed understanding and agreed to proceed.  History of Present Illness: Pt reports that her depression continues to worsen. She is overwhelmed by work and does not know if she will be able to learn the new information needed for her new position. She has called out several days this week. Pt is dealing with her son's ongoing mental health issues. She is isolating at home and fearful that she will see her ex-fiance and his new girlfriend every time she leaves the house. Her HV is very high. Pt states she is crying all the time. She has been having some on/off SI without plan or intent. It scares her because she worries that in a moment of weakness she might actually attempt to kill herself. Ian is not sleeping well. She stopped the 30mg  of Remeron because it was overly sedating. Tenzin feels the isolation is severely contributing to her depression. At times she feels "defeated and hopeless". She wishes she was back in [place] but she does not have any FMLA left. She does have sick time and short term disability. Lacee has been binging and purging recently.    Observations/Objective: I spoke with Rhys Martini on a video visit .  Pt was cooperative.  Pt was engaged in the conversation and answered questions appropriately.  Speech was clear and coherent with normal rate, tone and volume.  Mood is depressed and anxious, affect is congruent. Mersades cried thru out the visit. Thought processes are coherent and circumstantial.  Thought content is with ruminations. Brynlie is endorsing SI without plan or intent. Pt denies  HI.   Pt denies auditory and visual hallucinations and did not appear to be responding to internal stimuli.  Memory and concentration are good.  Fund of knowledge and use of language are average.  Insight and judgment are fair.  I am unable to comment on psychomotor activity, general appearance, hygiene, or eye contact as I was unable to physically see the patient on the phone.  Vital signs not available since interview conducted virtually.    Assessment and Plan: MDD- recurrent, severe without psychotic features vs Bipolar 2 d/o; Bulimia Nervosa; PTSD; Insomnia; r/o Borderline PD  Change dose of Remeron to 22.5mg  po qHS  Viibryd 40mg  qD   Pt reports hx of 7 concussions thru out her life.  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 Given information for [facility] Her therapist made pt aware of an in person IOP at [facility] in [place].  Pt is going to look into all options.   Follow Up Instructions: In 1 weeks or sooner if needed   I discussed the assessment and treatment plan with the patient. The patient was provided an opportunity to ask questions and all were answered. The patient agreed with the plan and demonstrated an understanding of the instructions.   The patient was advised to call back or seek an in-person evaluation if the symptoms worsen or if the condition fails to improve as anticipated.  I provided 35 minutes of non-face-to-face time during this encounter.   Charlcie Cradle, MD

## 2019-03-14 ENCOUNTER — Other Ambulatory Visit: Payer: Self-pay

## 2019-03-14 ENCOUNTER — Encounter (HOSPITAL_COMMUNITY): Payer: Self-pay | Admitting: Psychiatry

## 2019-03-14 ENCOUNTER — Ambulatory Visit (INDEPENDENT_AMBULATORY_CARE_PROVIDER_SITE_OTHER): Payer: 59 | Admitting: Psychiatry

## 2019-03-14 DIAGNOSIS — F5105 Insomnia due to other mental disorder: Secondary | ICD-10-CM

## 2019-03-14 DIAGNOSIS — F332 Major depressive disorder, recurrent severe without psychotic features: Secondary | ICD-10-CM

## 2019-03-14 DIAGNOSIS — F5081 Binge eating disorder: Secondary | ICD-10-CM | POA: Diagnosis not present

## 2019-03-14 DIAGNOSIS — F4310 Post-traumatic stress disorder, unspecified: Secondary | ICD-10-CM | POA: Diagnosis not present

## 2019-03-14 DIAGNOSIS — F99 Mental disorder, not otherwise specified: Secondary | ICD-10-CM

## 2019-03-14 MED ORDER — VIIBRYD 40 MG PO TABS: 40.0000 mg | ORAL_TABLET | Freq: Every day | ORAL | 0 refills | Status: DC

## 2019-03-14 MED ORDER — MIRTAZAPINE 15 MG PO TABS: 22.5000 mg | ORAL_TABLET | Freq: Every day | ORAL | 0 refills | Status: DC

## 2019-03-14 NOTE — Progress Notes (Signed)
Virtual Visit via Video Note  I connected with Monica Jensen on 03/14/19 at 12:00 PM EDT by a video enabled telemedicine application and verified that I am speaking with the correct person using two identifiers.   Location: Patient: home Provider: office   I discussed the limitations, risks, security and privacy concerns of performing an evaluation and management service by telephone and the availability of in person appointments. I also discussed with the patient that there may be a patient responsible charge related to this service. The patient expressed understanding and agreed to proceed.   History of Present Illness: Pt reports she is a little better this week. Last week was scary because she was so depressed and having SI. It is disheartening because she has so much treatment. Pt is unable to do the IOP due to her work. She has 4 months of short term disability but no FMLA and only 30 hrs of sick time. If things get really bad she will take another leave. Pt has not heard from ECT referral. Her therapist is again recommending [place]. Therapist suggested she talk to the psychiatrist there because she has done both [place] and ECT. This week her depression has "eased up a little" this week. Her sleep is good but with 22.5mg  she has a hard time waking up. She had some good days. This weekend she spent a lot of time with friends. She does not feel her friends now understand her mental illness and can not empathetize. Monica Jensen visited a friend she made in [place]. She continues to avoid her ex as much as possible. Monica Jensen did not think about binging at all over the weekend with her friend from [place]. A few times she was able to overcome the desire to binge. Monica Jensen wants to learn how to use her coping skills when having a bad time. Pt denies any manic or hypomanic like symptoms. Pt denies SI/HI.     Observations/Objective:   Last menstrual period 06/03/2014.There is no height or weight on file to calculate BMI.   General Appearance: Casual  Eye Contact:  Good  Speech:  Clear and Coherent and Normal Rate  Volume:  Normal  Mood:  Anxious and Depressed  Affect:  Congruent and calmer and brighter than last week  Thought Process:  Goal Directed and Descriptions of Associations: Intact  Orientation:  Full (Time, Place, and Person)  Thought Content:  Rumination  Suicidal Thoughts:  No  Homicidal Thoughts:  No  Memory:  Immediate;   Good  Judgement:  Intact  Insight:  Present  Psychomotor Activity:  Normal  Concentration:  Concentration: Good  Recall:  Good  Fund of Knowledge:  Good  Language:  Good  Akathisia:  No  Handed:  Right  AIMS (if indicated):     Assets:  Communication Skills Desire for Improvement Financial Resources/Insurance Housing Transportation Vocational/Educational  ADL's:  Intact  Cognition:  WNL  Sleep:          Assessment and Plan: MDD- recurrent, severe without psychotic features vs Bipolar II d/o; Bulimia Nervosa; PTSD; Insomnia; Borderline PD  Status of current symptoms: mild improvement in depression  Meds: Remeron 22.5mg  po qHS  Viibryd 40mg  po qD  Referred for ECT Given info for [facility]  Follow Up Instructions: In 4 weeks or sooner if needed   I discussed the assessment and treatment plan with the patient. The patient was provided an opportunity to ask questions and all were answered. The patient agreed with the plan and demonstrated  an understanding of the instructions.   The patient was advised to call back or seek an in-person evaluation if the symptoms worsen or if the condition fails to improve as anticipated.  I provided 35 minutes of non-face-to-face time during this encounter.   Oletta DarterSalina Tonette Koehne, MD

## 2019-04-11 ENCOUNTER — Other Ambulatory Visit: Payer: Self-pay

## 2019-04-11 ENCOUNTER — Ambulatory Visit (INDEPENDENT_AMBULATORY_CARE_PROVIDER_SITE_OTHER): Payer: 59 | Admitting: Psychiatry

## 2019-04-11 ENCOUNTER — Encounter: Payer: Self-pay | Admitting: Registered"

## 2019-04-11 ENCOUNTER — Encounter (HOSPITAL_COMMUNITY): Payer: Self-pay | Admitting: Psychiatry

## 2019-04-11 ENCOUNTER — Encounter: Payer: Managed Care, Other (non HMO) | Attending: Internal Medicine | Admitting: Registered"

## 2019-04-11 DIAGNOSIS — F5002 Anorexia nervosa, binge eating/purging type: Secondary | ICD-10-CM | POA: Insufficient documentation

## 2019-04-11 DIAGNOSIS — F5105 Insomnia due to other mental disorder: Secondary | ICD-10-CM | POA: Diagnosis not present

## 2019-04-11 DIAGNOSIS — F332 Major depressive disorder, recurrent severe without psychotic features: Secondary | ICD-10-CM | POA: Diagnosis not present

## 2019-04-11 DIAGNOSIS — F99 Mental disorder, not otherwise specified: Secondary | ICD-10-CM

## 2019-04-11 DIAGNOSIS — F4310 Post-traumatic stress disorder, unspecified: Secondary | ICD-10-CM | POA: Diagnosis not present

## 2019-04-11 DIAGNOSIS — F502 Bulimia nervosa: Secondary | ICD-10-CM | POA: Diagnosis not present

## 2019-04-11 MED ORDER — VIIBRYD 40 MG PO TABS: 40.0000 mg | ORAL_TABLET | Freq: Every day | ORAL | 1 refills | Status: DC

## 2019-04-11 MED ORDER — MIRTAZAPINE 15 MG PO TABS: 22.5000 mg | ORAL_TABLET | Freq: Every day | ORAL | 1 refills | Status: DC

## 2019-04-11 NOTE — Patient Instructions (Signed)
-   Aim to have Ensure daily for breakfast.

## 2019-04-11 NOTE — Progress Notes (Signed)
Virtual Visit via Video Note  I connected with Monica Jensen on 04/11/19 at  8:30 AM EDT by a video enabled telemedicine application and verified that I am speaking with the correct person using two identifiers.  Location: Patient: home Provider: office   I discussed the limitations of evaluation and management by telemedicine and the availability of in person appointments. The patient expressed understanding and agreed to proceed.  History of Present Illness: I am tired of being trapped by my depression and anxiety. She has contacted something at [employer] to look into getting her masters. She also will be starting TMS with Dr. Nolen Mu at [facility] on Tuesday. Pt states she went thru all the details of her history so that Dr. Nolen Mu is aware. Monica Jensen's depression is really bad and last week she was having SI. Pt cries a lot. She never knows what her mood is going to be like. This causing her anxiety. Her work is very frustrated. Her new position at work is not going well. 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 Pt will be starting an eating support group next week. They meet once a week. Her binging has been really bad. She still meeting with her therapist. 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 Her PTSD is ongoing. She continuously runs into her ex and his new girlfriend. Her intrusive memories are daily and her HV is high. Mostly Monica Jensen feels angry about all this. She has been sleeping more than usual. She is very tired at night and falls asleep and wakes up refreshed. Monica Jensen has not been feeling SI this week. She is hopeful about next week. Monica Jensen is looking for new jobs and has applied   Observations/Objective:  Last menstrual period 06/03/2014.There is no height or weight on file to calculate BMI.  General Appearance: Casual and Neat  Eye Contact:  Good  Speech:  Clear and Coherent and Normal Rate  Volume:  Normal  Mood:  Anxious and Depressed   Affect:  Congruent and but calmer and brighter than previous visits  Thought Process:  Goal Directed, Linear and Descriptions of Associations: Intact  Orientation:  Full (Time, Place, and Person)  Thought Content:  Logical  Suicidal Thoughts:  No  Homicidal Thoughts:  No  Memory:  Immediate;   Good Recent;   Good  Judgement:  Fair  Insight:  Fair  Psychomotor Activity:  Normal  Concentration:  Concentration: Good and Attention Span: Good  Recall:  Good  Fund of Knowledge:  Good  Language:  Good  Akathisia:  No  Handed:  Right  AIMS (if indicated):     Assets:  Communication Skills Desire for Improvement Financial Resources/Insurance Housing Resilience Social Support Talents/Skills Transportation Vocational/Educational  ADL's:  Intact  Cognition:  WNL  Sleep:        Assessment and Plan: MDD-recurrent, severe without psychotic features versus bipolar 2 disorder; bulimia nervosa; PTSD; insomnia; borderline personality disorder  Status of current symptoms: Ongoing depression, anxiety and PTSD  Continue: Remeron 22.5 mg p.o. nightly for mood and PTSD  Viibryd 40 mg p.o. daily for mood  Patient will be starting TMS treatments next week.  She continues to meet with her therapist.  She is joining an eating disorder support group next week as well.   Follow Up Instructions: In one month or sooner if needed   I discussed the assessment and treatment plan with the patient. The patient was provided an opportunity to ask  questions and all were answered. The patient agreed with the plan and demonstrated an understanding of the instructions.   The patient was advised to call back or seek an in-person evaluation if the symptoms worsen or if the condition fails to improve as anticipated.  I provided 30 minutes of non-face-to-face time during this encounter.   Monica Cradle, MD

## 2019-04-11 NOTE — Progress Notes (Signed)
Appointment start time: 3:20  Appointment end time: 4:35  Patient was seen on 04/11/2019 for nutrition counseling pertaining to disordered eating  Primary care provider: Josetta Huddle, MD Therapist: Alvis Lemmings  ROI:  Any other medical team members: none   Assessment  Reports history of diverticolsis, hiatal hernia, hypothyroidism, and intestinal surgery (age 53).   Pt arrives stating she sees therapist once/week. Currently works for [employer] in [place]. Plans to start [place] treatment next week. Reports trauma is connected to being in [place]. Likes to run 6 miles/day (about an hour/day). Reports binge-eating and purging episodes about 2-3 times/week. States she was in a cycle of binge-eating for 3 days, purging, running for hours and restricting to compensate.   States she was having Ensure smoothie with banana while in treatment center as well as sub sandwiches. States she had binge-purge episode last night due to seeing ex-fiance's girlfriend at a restaurant last night.   Reports having a hard time sitting down to eat especially when home alone. Not sure if she feels like she's not worth feeding self or taking care of self. Reports family (mom and sisters) is not support system. Lives at home with 60 year old son.    Eating history: Length of time: developed at age 29 Previous treatments: Residential center (7 weeks), PHP (3 weeks), 8 residential centers Goals for RD meetings: improve relationship with food  Weight history:  Highest weight: 125   Lowest weight: 68 Most consistent weight: 110-112 What would you like to weigh: N/A How has weight changed in the past year: fluctuating 100-120  Medical Information:  Changes in hair, skin, nails since ED started: not recently Chewing/swallowing difficulties: no Reflux or heartburn: yes, always been a huge problem Trouble with teeth: yes, has had alot dental problems  LMP without the use of hormones: N/A,  menopause since age 41   Constipation, diarrhea: yes diarrhea due to IBS Dizziness/lightheadedness: no Headaches/body aches: yes headaches since being home from residential treatment. Used to have migraines during childhood Heart racing/chest pain: not outside of panic attacks Mood: not good Sleep: getting better, sleeps 8 hrs/night Focus/concentration: terrible Cold intolerance: no Vision changes: no  Mental health diagnosis: AN, binge-purge type  Dietary assessment: A typical day consists of 1 meals and 0 snacks  Safe foods include: eggs, Ensure, chickpeas, bananas, bars Avoided foods include: fried foods   Allergies: chocolate, cheese 24 hour recall:  B: skipped S: L: skipped S: D: chicken skewers + Ahi tuna  S: binge episode   What Methods Do You Use To Control Your Weight (Compensatory behaviors)?           Restricting (calories, fat, carbs)  SIV  Exercise (running)  Binge  Estimated energy intake:  kcal  Estimated energy needs: 2000-2200 kcal 225-248 g CHO 150-165 g pro 56-61 g fat  Nutrition Diagnosis: NB-1.5 Disordered eating pattern As related to meal skipping.  As evidenced by dietary recall.  Intervention/Goals: Pt was educated and counseled on the ED treatment team and discussed ways to increase nourishment. Pt was in agreement with goals listed.  Goals: - Aim to have Ensure daily for breakfast.   Monitoring and Evaluation: Patient will follow up in 1 week.

## 2019-04-17 ENCOUNTER — Ambulatory Visit: Payer: Self-pay | Admitting: Registered"

## 2019-05-16 ENCOUNTER — Ambulatory Visit (INDEPENDENT_AMBULATORY_CARE_PROVIDER_SITE_OTHER): Payer: 59 | Admitting: Psychiatry

## 2019-05-16 ENCOUNTER — Encounter (HOSPITAL_COMMUNITY): Payer: Self-pay | Admitting: Psychiatry

## 2019-05-16 ENCOUNTER — Other Ambulatory Visit: Payer: Self-pay

## 2019-05-16 DIAGNOSIS — F4310 Post-traumatic stress disorder, unspecified: Secondary | ICD-10-CM | POA: Diagnosis not present

## 2019-05-16 DIAGNOSIS — F332 Major depressive disorder, recurrent severe without psychotic features: Secondary | ICD-10-CM

## 2019-05-16 DIAGNOSIS — F5105 Insomnia due to other mental disorder: Secondary | ICD-10-CM | POA: Diagnosis not present

## 2019-05-16 DIAGNOSIS — F99 Mental disorder, not otherwise specified: Secondary | ICD-10-CM

## 2019-05-16 MED ORDER — MIRTAZAPINE 15 MG PO TABS: 22.5000 mg | ORAL_TABLET | Freq: Every day | ORAL | 2 refills | Status: DC

## 2019-05-16 MED ORDER — VIIBRYD 40 MG PO TABS: 40.0000 mg | ORAL_TABLET | Freq: Every day | ORAL | 2 refills | Status: DC

## 2019-05-16 NOTE — Progress Notes (Signed)
Virtual Visit via Video Note  I connected with Monica Jensen on 05/16/19 at  4:00 PM EST by a video enabled telemedicine application and verified that I am speaking with the correct person using two identifiers.  Location: Patient: home Provider: office   I discussed the limitations of evaluation and management by telemedicine and the availability of in person appointments. The patient expressed understanding and agreed to proceed.  History of Present Illness: Pt reports she has completed 1/2 of her TMS treatments. It will end somewhere around Thanksgiving. She states at first it seemed to help a lot but now she is back to feeling depressed. The weeks she does not have her son are very hard. The 2 biggest triggers are her job and the fear of seeing her ex. She is applying for jobs but has not found anything yet. She reports having so much anger with her ex and his new girlfriend. Monica Jensen wants to move but can't seem to as of yet. She keeps trying to get better even though she wants to give up sometimes. She denies SI/HI. She denies any manic or hypomanic like symptoms. Monica Jensen has ongoing binging episodes.    Observations/Objective:  Last menstrual period 06/03/2014.There is no height or weight on file to calculate BMI.  General Appearance: Casual  Eye Contact:  Good  Speech:  Clear and Coherent and Normal Rate  Volume:  Normal  Mood:  Anxious and Depressed  Affect:  Congruent and Tearful  Thought Process:  Coherent and Descriptions of Associations: Circumstantial  Orientation:  Full (Time, Place, and Person)  Thought Content:  Rumination  Suicidal Thoughts:  No  Homicidal Thoughts:  No  Memory:  Immediate;   Good  Judgement:  Fair  Insight:  Good  Psychomotor Activity:  Normal  Concentration:  Concentration: Good and Attention Span: Good  Recall:  Good  Fund of Knowledge:  Good  Language:  Good  Akathisia:  No  Handed:  Right  AIMS (if indicated):     Assets:  Communication  Skills Desire for Improvement Financial Resources/Insurance Housing Physical Health Resilience Talents/Skills Transportation Vocational/Educational  ADL's:  Intact  Cognition:  WNL  Sleep:        Assessment and Plan: MDD-recurrent, severe without psychotic features versus bipolar 2 disorder; bulimia nervosa; PTSD; insomnia; borderline personality disorder Status of current symptoms: Ongoing depression, anxiety, binging episodes and PTSD  Continue Remeron 22.5 mg p.o. nightly for mood and PTSD  Continue Viibryd 40 mg p.o. daily for mood  TMS treatments will end near Thanksgiving  Discussed looking at stressors that she can control and looking for solutions  Encouraged to continue therapy  Follow Up Instructions: In 2-3 months or sooner if needed   I discussed the assessment and treatment plan with the patient. The patient was provided an opportunity to ask questions and all were answered. The patient agreed with the plan and demonstrated an understanding of the instructions.   The patient was advised to call back or seek an in-person evaluation if the symptoms worsen or if the condition fails to improve as anticipated.  I provided 35 minutes of non-face-to-face time during this encounter.   Charlcie Cradle, MD

## 2019-06-17 ENCOUNTER — Other Ambulatory Visit (HOSPITAL_COMMUNITY): Payer: Self-pay | Admitting: Psychiatry

## 2019-06-17 DIAGNOSIS — F99 Mental disorder, not otherwise specified: Secondary | ICD-10-CM

## 2019-06-17 DIAGNOSIS — F411 Generalized anxiety disorder: Secondary | ICD-10-CM

## 2019-06-17 DIAGNOSIS — F5105 Insomnia due to other mental disorder: Secondary | ICD-10-CM

## 2019-07-04 ENCOUNTER — Ambulatory Visit (INDEPENDENT_AMBULATORY_CARE_PROVIDER_SITE_OTHER): Payer: 59 | Admitting: Psychiatry

## 2019-07-04 ENCOUNTER — Other Ambulatory Visit: Payer: Self-pay

## 2019-07-04 ENCOUNTER — Encounter (HOSPITAL_COMMUNITY): Payer: Self-pay | Admitting: Psychiatry

## 2019-07-04 DIAGNOSIS — F99 Mental disorder, not otherwise specified: Secondary | ICD-10-CM

## 2019-07-04 DIAGNOSIS — F332 Major depressive disorder, recurrent severe without psychotic features: Secondary | ICD-10-CM

## 2019-07-04 DIAGNOSIS — F4310 Post-traumatic stress disorder, unspecified: Secondary | ICD-10-CM

## 2019-07-04 DIAGNOSIS — F5105 Insomnia due to other mental disorder: Secondary | ICD-10-CM

## 2019-07-04 MED ORDER — MIRTAZAPINE 15 MG PO TABS: 22.5000 mg | ORAL_TABLET | Freq: Every day | ORAL | 0 refills | Status: DC

## 2019-07-04 MED ORDER — VIIBRYD 40 MG PO TABS: 40.0000 mg | ORAL_TABLET | Freq: Every day | ORAL | 0 refills | Status: DC

## 2019-07-04 NOTE — Progress Notes (Signed)
Virtual Visit via Telephone Note  I connected with Monica Jensen  on 07/04/19 at  9:00 AM EST by telephone and verified that I am speaking with the correct person using two identifiers.  Location: Patient: home Provider: office   I discussed the limitations, risks, security and privacy concerns of performing an evaluation and management service by telephone and the availability of in person appointments. I also discussed with the patient that there may be a patient responsible charge related to this service. The patient expressed understanding and agreed to proceed.   History of Present Illness: Monica Jensen reports she is doing well. She was given a performance plan and felt it was unfair. She contacted HR and then decided to resign last Friday. Monica Jensen is going to work in Jan but will get paid till the end of Feb. She feels so much relief and is really happy! Monica Jensen plans to apply for a master's and thinks she might have a good job lead with the [employer]. 3 weeks ago she was feeling very depressed but noticed she was started to feel better gradually. At first she thought the [place] didn't work but now feels it worked but the effect was slow to start. Monica Jensen feels happy "and it's weird". It has been so long and she is a little scared that it will not last. Despite having multiple stressors regarding her home she is dealing with it well. She has not cried or had a panic attack in 2 weeks. Her sleep is good and her energy is fair. Monica Jensen is sleeping a lot and feels like she is catching up on rest. At times she feels mentally and emotionally drained. She denies manic or hypomanic like symptoms. Monica Jensen has been keeping busy with work and doing Christmas shopping. Prior to quitting she was so busy she didn't have time to think about binging and purging. Since quitting she has not wanted to. Monica Jensen does admit she doesn't know what is going to happen now that she has 10 days off. She has been  restricting a little. She denies SI/HI. In regards to PTSD she is dealing with it. She has been avoiding FB because she was seeing her ex and girlfriend all over. She still gets mad when she see's them but it is not bothering her as much. Her son is doing well.     Observations/Objective: I spoke with Monica Jensen on the phone.  Pt was calm, pleasant and cooperative.  Pt was engaged in the conversation and answered questions appropriately.  Speech was clear and coherent with normal rate, tone and volume.  Mood is euthymic and affect is full. Thought processes are coherent, goal oriented and intact.  Thought content is logical.  Pt denies SI/HI.   Pt denies auditory and visual hallucinations and did not appear to be responding to internal stimuli.  Memory and concentration are good.  Fund of knowledge and use of language are average.  Insight and judgment are fair.  I am unable to comment on psychomotor activity, general appearance, hygiene, or eye contact as I was unable to physically see the patient on the phone.  Vital signs not available since interview conducted virtually.     Assessment and Plan: MDD-recurrent, severe without psychotic features vs Bipolar 2 d/o; Bulimia Nervosa; PTSD; Insomnia; Borderline PD  Status of current symptoms: improved overall  Remeron 22.5mg  po qHS   Viibryd 40mg  po qD  TMS completed in Nov 2020  She is engaging in therapy  Follow Up Instructions: In 8 weeks or sooner if needed   I discussed the assessment and treatment plan with the patient. The patient was provided an opportunity to ask questions and all were answered. The patient agreed with the plan and demonstrated an understanding of the instructions.   The patient was advised to call back or seek an in-person evaluation if the symptoms worsen or if the condition fails to improve as anticipated.  I provided 25 minutes of non-face-to-face time during this encounter.   Oletta Darter, MD

## 2019-08-29 ENCOUNTER — Encounter (HOSPITAL_COMMUNITY): Payer: Self-pay | Admitting: Psychiatry

## 2019-08-29 ENCOUNTER — Ambulatory Visit (INDEPENDENT_AMBULATORY_CARE_PROVIDER_SITE_OTHER): Payer: 59 | Admitting: Psychiatry

## 2019-08-29 ENCOUNTER — Other Ambulatory Visit: Payer: Self-pay

## 2019-08-29 DIAGNOSIS — F5105 Insomnia due to other mental disorder: Secondary | ICD-10-CM

## 2019-08-29 DIAGNOSIS — F502 Bulimia nervosa: Secondary | ICD-10-CM | POA: Diagnosis not present

## 2019-08-29 DIAGNOSIS — F332 Major depressive disorder, recurrent severe without psychotic features: Secondary | ICD-10-CM | POA: Diagnosis not present

## 2019-08-29 DIAGNOSIS — F4310 Post-traumatic stress disorder, unspecified: Secondary | ICD-10-CM

## 2019-08-29 DIAGNOSIS — F99 Mental disorder, not otherwise specified: Secondary | ICD-10-CM

## 2019-08-29 MED ORDER — MIRTAZAPINE 15 MG PO TABS: 22.5000 mg | ORAL_TABLET | Freq: Every day | ORAL | 0 refills | Status: DC

## 2019-08-29 MED ORDER — VIIBRYD 40 MG PO TABS: 40.0000 mg | ORAL_TABLET | Freq: Every day | ORAL | 0 refills | Status: DC

## 2019-08-29 NOTE — Progress Notes (Signed)
Virtual Visit via Video Note  I connected with Monica Jensen on 08/29/19 at  9:30 AM EST by phone. We attempted a video enabled telemedicine application but the Internet connection was unstable. I verified that I am speaking with the correct person using two identifiers.  Location: Patient: home Provider: office   I discussed the limitations of evaluation and management by telemedicine and the availability of in person appointments. The patient expressed understanding and agreed to proceed.  History of Present Illness: Pt reports she has good and bad days. She is still triggered by seeing her ex and his current girlfriend. She feels betrayed by him and her friends. Monica Jensen does not feel she can't trust them with her feelings. Each time she see's her ex or his g/f she feels depressed. Last week she had about 2-3 days where she was very depressed, unmotivated, crying and spent all day in bed. This is significantly decreased from several months ago. Her concentration is poor but she does not want to restart Vyvanse as it increases her anxiety. Monica Jensen denies SI/HI. She is tired of all of this and wishes she could move to [place]. Her binging episodes are occurring 2-3x/week and are due to depression. She is very happy that she quit her last job and it's toxic environment. She is scared because she is still looking for a job. Monica Jensen is active during the day. She is motivated to find work and get into a grad school program. Monica Jensen is engaging in daily positive coping skills.    Observations/Objective: General Appearance: unable to assess  Eye Contact:  unable to assess  Speech:  Clear and Coherent and Normal Rate  Volume:  Normal  Mood:  Anxious and Depressed  Affect:  Congruent and Tearful  Thought Process:  Coherent and Descriptions of Associations: Circumstantial  Orientation:  Full (Time, Place, and Person)  Thought Content:  Rumination  Suicidal Thoughts:  No  Homicidal Thoughts:  No  Memory:   Immediate;   Good  Judgement:  Fair  Insight:  Fair  Psychomotor Activity:  unable to assess  Concentration:  Concentration: Good  Recall:  Good  Fund of Knowledge:  Good  Language:  Good  Akathisia:  unable to assess  Handed:  Right  AIMS (if indicated):     Assets:  Communication Skills Desire for Improvement Housing Talents/Skills Transportation Vocational/Educational  ADL's:  Unable to assess  Cognition:  WNL  Sleep:       I have reviewed the information below on 08/29/19 and have updated it Assessment and Plan: MDD-recurrent, severe without psychotic features vs Bipolar 2 d/o; Bulimia Nervosa; PTSD; Insomnia; Borderline PD   Status of current symptoms: mild improvement in depression, ongoing binging episodes   Remeron 22.5mg  po qHS    Viibryd 40mg  po qD   TMS completed in Nov 2020   She is engaging in therapy  Monica Jensen is asking about use of [place] mushroom for treatment of poor concentration. I will have to research more on this supplement to provide any recommendations to Monica Jensen.    Follow Up Instructions: In 8 weeks or sooner if needed   I discussed the assessment and treatment plan with the patient. The patient was provided an opportunity to ask questions and all were answered. The patient agreed with the plan and demonstrated an understanding of the instructions.   The patient was advised to call back or seek an in-person evaluation if the symptoms worsen or if the condition fails to improve  as anticipated.  I provided 35 minutes of non-face-to-face time during this encounter.   Oletta Darter, MD

## 2019-10-24 ENCOUNTER — Other Ambulatory Visit: Payer: Self-pay

## 2019-10-24 ENCOUNTER — Encounter (HOSPITAL_COMMUNITY): Payer: Self-pay | Admitting: Psychiatry

## 2019-10-24 ENCOUNTER — Ambulatory Visit (INDEPENDENT_AMBULATORY_CARE_PROVIDER_SITE_OTHER): Payer: BLUE CROSS/BLUE SHIELD | Admitting: Psychiatry

## 2019-10-24 DIAGNOSIS — F5105 Insomnia due to other mental disorder: Secondary | ICD-10-CM | POA: Diagnosis not present

## 2019-10-24 DIAGNOSIS — F4310 Post-traumatic stress disorder, unspecified: Secondary | ICD-10-CM

## 2019-10-24 DIAGNOSIS — F332 Major depressive disorder, recurrent severe without psychotic features: Secondary | ICD-10-CM | POA: Diagnosis not present

## 2019-10-24 DIAGNOSIS — F99 Mental disorder, not otherwise specified: Secondary | ICD-10-CM

## 2019-10-24 MED ORDER — MIRTAZAPINE 15 MG PO TABS: 22.5000 mg | ORAL_TABLET | Freq: Every day | ORAL | 0 refills | Status: DC

## 2019-10-24 MED ORDER — VIIBRYD 40 MG PO TABS: 40.0000 mg | ORAL_TABLET | Freq: Every day | ORAL | 0 refills | Status: DC

## 2019-10-24 MED ORDER — ESZOPICLONE 3 MG PO TABS: 3.0000 mg | ORAL_TABLET | Freq: Every evening | ORAL | 0 refills | Status: DC | PRN

## 2019-10-24 NOTE — Progress Notes (Signed)
Virtual Visit via Telephone Note  I connected with Monica Jensen on 10/24/19 at  2:30 PM EDT by telephone and verified that I am speaking with the correct person using two identifiers.  Location: Patient: home Provider: office   I discussed the limitations, risks, security and privacy concerns of performing an evaluation and management service by telephone and the availability of in person appointments. I also discussed with the patient that there may be a patient responsible charge related to this service. The patient expressed understanding and agreed to proceed.   History of Present Illness: "Things have improved since we last spoke". She has been very busy applying for graduate school, applying for [name] and jobs. Monica Jensen feels like a recent beach trip with her best friend helped to "reset" her mind state. Her desire to move out to [name] remains high. Monica Jensen denies SI/HI. She is excited about the future. At times she gets scared about how she will handle going back to school. Monica Jensen started [employer] for concentration about one month ago. It seems to be helping.  When she is having a bad day she will take an additional tab of Viibryd. Coming in contact with her ex causes her to feel very depressed and binge for 2-3 days. Lately her sleep is not good. She is thinking about taking Lunesta but she doesn't want to get hooked back on sleep aids.     Observations/Objective:  General Appearance: unable to assess  Eye Contact:  unable to assess  Speech:  Clear and Coherent and Normal Rate  Volume:  Normal  Mood:  Depressed  Affect:  Full Range  Thought Process:  Coherent and Descriptions of Associations: Circumstantial  Orientation:  Full (Time, Place, and Person)  Thought Content:  Logical  Suicidal Thoughts:  No  Homicidal Thoughts:  No  Memory:  Immediate;   Good  Judgement:  Good  Insight:  Good  Psychomotor Activity: unable to assess  Concentration:  Concentration: Good   Recall:  Good  Fund of Knowledge:  Good  Language:  Good  Akathisia:  unable to assess  Handed:  Right  AIMS (if indicated):     Assets:  Communication Skills Desire for Improvement Financial Resources/Insurance Housing Talents/Skills Transportation Vocational/Educational  ADL's:  unable to assess  Cognition:  WNL  Sleep:         I reviewed the information below on 10/24/19 and have updated it Assessment and Plan: MDD-recurrent, severe without psychotic features vs Bipolar 2 d/o; Bulimia Nervosa; PTSD; Insomnia; Borderline PD   Status of current symptoms:stable   Remeron 22.5mg  po qHS    Viibryd 40mg  po qD  Start Lunesta 3mg  po qHS prn insomnia-10 tabs/month with no refills   TMS completed in Nov 2020   She is engaging in therapy    Follow Up Instructions: In 2-3 months or sooner if needed   I discussed the assessment and treatment plan with the patient. The patient was provided an opportunity to ask questions and all were answered. The patient agreed with the plan and demonstrated an understanding of the instructions.   The patient was advised to call back or seek an in-person evaluation if the symptoms worsen or if the condition fails to improve as anticipated.  I provided 25 minutes of non-face-to-face time during this encounter.   , MD

## 2019-10-28 ENCOUNTER — Telehealth (HOSPITAL_COMMUNITY): Payer: Self-pay

## 2019-10-28 NOTE — Telephone Encounter (Signed)
Prior authorization sent for Viibryd 40mg  tablets through CoverMyMeds and approved. Effective 10/28/2019-10/26/2022.  KEY: [facility]  Pharmacy called and updated on approval.

## 2019-12-19 ENCOUNTER — Other Ambulatory Visit: Payer: Self-pay

## 2019-12-19 ENCOUNTER — Encounter (HOSPITAL_COMMUNITY): Payer: Self-pay | Admitting: Psychiatry

## 2019-12-19 ENCOUNTER — Ambulatory Visit (INDEPENDENT_AMBULATORY_CARE_PROVIDER_SITE_OTHER): Payer: BLUE CROSS/BLUE SHIELD | Admitting: Psychiatry

## 2019-12-19 DIAGNOSIS — F99 Mental disorder, not otherwise specified: Secondary | ICD-10-CM

## 2019-12-19 DIAGNOSIS — F5105 Insomnia due to other mental disorder: Secondary | ICD-10-CM | POA: Diagnosis not present

## 2019-12-19 DIAGNOSIS — F332 Major depressive disorder, recurrent severe without psychotic features: Secondary | ICD-10-CM

## 2019-12-19 DIAGNOSIS — F4310 Post-traumatic stress disorder, unspecified: Secondary | ICD-10-CM

## 2019-12-19 MED ORDER — MIRTAZAPINE 15 MG PO TABS: 22.5000 mg | ORAL_TABLET | Freq: Every day | ORAL | 0 refills | Status: DC

## 2019-12-19 MED ORDER — ESZOPICLONE 3 MG PO TABS: 3.0000 mg | ORAL_TABLET | Freq: Every evening | ORAL | 1 refills | Status: DC | PRN

## 2019-12-19 MED ORDER — VIIBRYD 40 MG PO TABS: 40.0000 mg | ORAL_TABLET | Freq: Every day | ORAL | 0 refills | Status: DC

## 2019-12-19 NOTE — Progress Notes (Signed)
Virtual Visit via Video Note  I connected with Monica Jensen on 12/19/19 at 10:00 AM EDT by a phone application. We attempted to do a  video enabled telemedicine application but were unsuccessful and opted to continue by phone. I verified that I am speaking with the correct person using two identifiers.  Location: Patient: home Provider: office   I discussed the limitations of evaluation and management by telemedicine and the availability of in person appointments. The patient expressed understanding and agreed to proceed.  History of Present Illness: Monica Jensen states the last 2 weeks have been stressful. Prior to that she was doing well. She is actively working on her [employer] and getting her son's stuff ready for college. She has met a new man who lives in [place]. They met for the first time recently. They spent several days together. After he went back home to [facility] and she was overwhelmed with emotions. It is the first time she has had feelings for anyone since her ex. A few days after he left she found out that a huge group of her friends went to the beach with her ex fiance and his girlfriend. She was hurt and depressed. It caused her to have SI and she was scared to stay alone. Monica Jensen stayed with her ex-husband. Right the trip was over she texted a very dear friend. Monica Jensen wanted to meet in person but her friend refused and they exchanged a few more texts. Now Monica Jensen is very upset and feels she is replaceable and that her old friends no longer care about her. It has brought up feelings up rejection, abandonment and poor self esteem. She is working on it focusing on developing those friendships. Monica Jensen is discussing it in therapy. Her mind is racing and she is not sleeping. The Monica Jensen is helping and she is getting about 7 hrs/sleep. Today she denies SI/HI. Due to the stress her appetite has gone down. She has not had any binging episodes for the last 2 weeks. Despite all the stress she is going out  and interacting with her new friends. Yesterday she started feeling like she was coming out of her negative feelings.    Observations/Objective:  General Appearance: unable to assess  Eye Contact:  unable to assess  Speech:  Clear and Coherent and Normal Rate  Volume:  Normal  Mood:  Anxious and Depressed  Affect:  Congruent and Tearful  Thought Process:  Coherent and Descriptions of Associations: Circumstantial  Orientation:  Full (Time, Place, and Person)  Thought Content:  Rumination  Suicidal Thoughts:  No  Homicidal Thoughts:  No  Memory:  Immediate;   Good  Judgement:  Fair  Insight:  Good  Psychomotor Activity: unable to assess  Concentration:  Concentration: Good  Recall:  Good  Fund of Knowledge:  Good  Language:  Good  Akathisia:  unable to assess  Handed:  Right  AIMS (if indicated):     Assets:  Communication Skills Desire for Monica Jensen Talents/Skills Transportation Vocational/Educational  ADL's:  unable to assess  Cognition:  WNL  Sleep:        I reviewed the information below on 12/19/2019 and have updated it. Assessment and Plan: MDD-recurrent, severe without psychotic features vs Bipolar 2 d/o; Bulimia Nervosa; PTSD; Insomnia; Borderline PD   Status of current symptoms: worsening depression   Remeron 22.85m po qHS    Viibryd 457mpo qD   Lunesta 50m29mo qHS prn insomnia-10 tabs/month with 1 refill  [place] completed in Nov 2020   She is engaging in therapy    Follow Up Instructions: In 6-10 weeks or sooner if needed.   I discussed the assessment and treatment plan with the patient. The patient was provided an opportunity to ask questions and all were answered. The patient agreed with the plan and demonstrated an understanding of the instructions.   The patient was advised to call back or seek an in-person evaluation if the symptoms worsen or if the condition fails to improve as anticipated.  I provided 30  minutes of non-face-to-face time during this encounter.   Charlcie Cradle, MD

## 2020-02-27 ENCOUNTER — Telehealth (HOSPITAL_COMMUNITY): Payer: BLUE CROSS/BLUE SHIELD | Admitting: Psychiatry

## 2020-03-05 ENCOUNTER — Telehealth (HOSPITAL_COMMUNITY): Payer: BLUE CROSS/BLUE SHIELD | Admitting: Psychiatry

## 2020-03-05 ENCOUNTER — Other Ambulatory Visit: Payer: Self-pay

## 2020-03-05 DIAGNOSIS — F332 Major depressive disorder, recurrent severe without psychotic features: Secondary | ICD-10-CM

## 2020-03-05 DIAGNOSIS — F410 Panic disorder [episodic paroxysmal anxiety] without agoraphobia: Secondary | ICD-10-CM

## 2020-03-05 DIAGNOSIS — F430 Acute stress reaction: Secondary | ICD-10-CM

## 2020-03-05 DIAGNOSIS — F99 Mental disorder, not otherwise specified: Secondary | ICD-10-CM

## 2020-03-05 DIAGNOSIS — F4310 Post-traumatic stress disorder, unspecified: Secondary | ICD-10-CM

## 2020-03-05 DIAGNOSIS — F5105 Insomnia due to other mental disorder: Secondary | ICD-10-CM

## 2020-03-05 NOTE — Progress Notes (Unsigned)
Virtual Visit via Telephone Note  I connected with Monica Jensen on 03/05/20 at 11:15 AM EDT by telephone and verified that I am speaking with the correct person using two identifiers.  Location: Patient: home Provider: office   I discussed the limitations, risks, security and privacy concerns of performing an evaluation and management service by telephone and the availability of in person appointments. I also discussed with the patient that there may be a patient responsible charge related to this service. The patient expressed understanding and agreed to proceed.   History of Present Illness: Monica Jensen has been feeling emotional because of a number of stressors. Her started college 2 weeks ago. Her sister was hospitalized for heart failure. Monica Jensen started grad school that same week. It was a lot for 1 week. Grad school has been hard. Last week was hard because she was overwhelmed by the assignments and work she has to do. She has a lot of anxiety and some panic attacks. Monica Jensen is thinking of dropping a class but was told she can not. Still she is excited and wants to continue. Her advisor encouraged Monica Jensen to seek out school accommodations. Monica Jensen still has days where she is depressed and cries a lot. Lately she has been so busy that her anxiety is worse than her depression. She denies SI/HI. Regardless she feels like she is getting better and moving forward but her anxiety is triggered on a near daily basis. At times it feels paralyzing. She always tries to use coping skills when anxious and sometimes it works and other times it does not. Monica Jensen is still having some issues with PTSD. She went to visit a guy she was dating and experienced nightmares and HV. She is still thinking seriously about moving out of [place]. Monica Jensen is still not sleeping well. Her son told her she sometimes wakes up screaming. She does not remember her dreams. Monica Jensen ran out of Alfonso Patten a few weeks ago and prior to that she  was taking it a randomly but did take it for almost 1 week ago after seeing the guy seeing was dating. The Viibryd is working well and she wants to continue it. She also wants to continue Lunesta for sleep.    Observations/Objective:  General Appearance: unable to assess  Eye Contact:  unable to assess  Speech:  Clear and Coherent and Normal Rate  Volume:  Normal  Mood:  Anxious  Affect:  Congruent  Thought Process:  Coherent and Descriptions of Associations: Circumstantial  Orientation:  Full (Time, Place, and Person)  Thought Content:  Logical  Suicidal Thoughts:  No  Homicidal Thoughts:  No  Memory:  {BHH MEMORY:22881}  Judgement:  {Judgement (PAA):22694}  Insight:  {Insight (PAA):22695}  Psychomotor Activity: unable to assess  Concentration:  {Concentration:21399}  Recall:  {BHH GOOD/FAIR/POOR:22877}  Fund of Knowledge:  {BHH GOOD/FAIR/POOR:22877}  Language:  {BHH GOOD/FAIR/POOR:22877}  Akathisia:  unable to assess  Handed:  {Handed:22697}  AIMS (if indicated):     Assets:  {Assets (PAA):22698}  ADL's:  unable to assess  Cognition:  {chl bhh cognition:304700322}  Sleep:        I reviewed the information below on 03/05/20 and have updated it. Assessment and Plan:  MDD-recurrent, severe without psychotic features vs Bipolar 2 d/o; Bulimia Nervosa; PTSD; Insomnia; Borderline PD   Status of current symptoms: feeling overwhelmed and anxious   Remeron 22.5mg  po qHS    Viibryd 40mg  po qD   Lunesta 3mg  po qHS prn insomnia-10 tabs/month with 1  refill  Start trial of Propanolol 10mg  po qD prn anxiety   TMS completed in Nov 2020   She is engaging in therapy    Follow Up Instructions: In 4-8 weeks or sooner if needed   I discussed the assessment and treatment plan with the patient. The patient was provided an opportunity to ask questions and all were answered. The patient agreed with the plan and demonstrated an understanding of the instructions.   The patient was  advised to call back or seek an in-person evaluation if the symptoms worsen or if the condition fails to improve as anticipated.  I provided 35 minutes of non-face-to-face time during this encounter.   Dec 2020, MD

## 2020-03-10 ENCOUNTER — Telehealth (HOSPITAL_COMMUNITY): Payer: Self-pay | Admitting: *Deleted

## 2020-03-10 NOTE — Telephone Encounter (Signed)
Patient called and requested refill of Lunesta. She also reported that she had increased anxiety and wanted to start the Propanolol that you had discussed at her last visit. Please review and advise.

## 2020-03-12 MED ORDER — VIIBRYD 40 MG PO TABS: 40.0000 mg | ORAL_TABLET | Freq: Every day | ORAL | 0 refills | Status: DC

## 2020-03-12 MED ORDER — PROPRANOLOL HCL 10 MG PO TABS: 10.0000 mg | ORAL_TABLET | Freq: Every day | ORAL | 1 refills | Status: DC | PRN

## 2020-03-12 MED ORDER — ESZOPICLONE 3 MG PO TABS: 3.0000 mg | ORAL_TABLET | Freq: Every evening | ORAL | 1 refills | Status: DC | PRN

## 2020-03-12 MED ORDER — MIRTAZAPINE 15 MG PO TABS: 22.5000 mg | ORAL_TABLET | Freq: Every day | ORAL | 0 refills | Status: DC

## 2020-03-12 NOTE — Telephone Encounter (Signed)
done

## 2020-03-25 ENCOUNTER — Encounter (HOSPITAL_COMMUNITY): Payer: Self-pay

## 2020-04-23 ENCOUNTER — Telehealth (HOSPITAL_COMMUNITY): Payer: BLUE CROSS/BLUE SHIELD | Admitting: Psychiatry

## 2020-05-04 ENCOUNTER — Other Ambulatory Visit (HOSPITAL_COMMUNITY): Payer: Self-pay | Admitting: Psychiatry

## 2020-05-04 DIAGNOSIS — F410 Panic disorder [episodic paroxysmal anxiety] without agoraphobia: Secondary | ICD-10-CM

## 2020-05-04 DIAGNOSIS — F430 Acute stress reaction: Secondary | ICD-10-CM

## 2020-05-07 ENCOUNTER — Telehealth (INDEPENDENT_AMBULATORY_CARE_PROVIDER_SITE_OTHER): Payer: BLUE CROSS/BLUE SHIELD | Admitting: Psychiatry

## 2020-05-07 ENCOUNTER — Other Ambulatory Visit: Payer: Self-pay

## 2020-05-07 DIAGNOSIS — F4310 Post-traumatic stress disorder, unspecified: Secondary | ICD-10-CM

## 2020-05-07 DIAGNOSIS — F5105 Insomnia due to other mental disorder: Secondary | ICD-10-CM

## 2020-05-07 DIAGNOSIS — F410 Panic disorder [episodic paroxysmal anxiety] without agoraphobia: Secondary | ICD-10-CM

## 2020-05-07 DIAGNOSIS — F99 Mental disorder, not otherwise specified: Secondary | ICD-10-CM

## 2020-05-07 DIAGNOSIS — F332 Major depressive disorder, recurrent severe without psychotic features: Secondary | ICD-10-CM

## 2020-05-07 DIAGNOSIS — F430 Acute stress reaction: Secondary | ICD-10-CM

## 2020-05-07 MED ORDER — ESZOPICLONE 3 MG PO TABS: 3.0000 mg | ORAL_TABLET | Freq: Every evening | ORAL | 3 refills | Status: DC | PRN

## 2020-05-07 MED ORDER — VIIBRYD 40 MG PO TABS: 40.0000 mg | ORAL_TABLET | Freq: Every day | ORAL | 0 refills | Status: DC

## 2020-05-07 MED ORDER — MIRTAZAPINE 15 MG PO TABS: 22.5000 mg | ORAL_TABLET | Freq: Every day | ORAL | 0 refills | Status: DC

## 2020-05-07 NOTE — Progress Notes (Signed)
Virtual Visit via Video Note  I connected with Monica Jensen on 05/07/20 at  8:30 AM EDT by a video enabled telemedicine application and verified that I am speaking with the correct person using two identifiers.  Location: Patient: home Provider: office   I discussed the limitations of evaluation and management by telemedicine and the availability of in person appointments. The patient expressed understanding and agreed to proceed.  History of Present Illness: Anxiety and panic attacks are high due to stress related to school. The work load is overwhelming. Per discussions with her classmates it the same for them. She is rarely taking Propranolol. She has been so busy she has no time for depression. Due to the never ending work load she is not sleeping as well when she does sleep. She takes Lunesta 2-3x/week.  PTSD- trigger because last Sunday new girlfriend bday and 1st time he spent the night with new girlfriend. So busy doesn't have time to think about it much and recovery time is much quicker.    Observations/Objective: Psychiatric Specialty Exam:   Last menstrual period 06/03/2014.There is no height or weight on file to calculate BMI.  General Appearance: Casual  Eye Contact:  Good  Speech:  Clear and Coherent and Normal Rate  Volume:  Normal  Mood:  Anxious  Affect:  Full Range  Thought Process:  Coherent and Descriptions of Associations: Circumstantial  Orientation:  Full (Time, Place, and Person)  Thought Content:  Logical  Suicidal Thoughts:  No  Homicidal Thoughts:  No  Memory:  Immediate;   Good  Judgement:  Good  Insight:  Good  Psychomotor Activity:  Normal  Concentration:  Concentration: Good  Recall:  Good  Fund of Knowledge:  Good  Language:  Good  Akathisia:  No  Handed:  Right  AIMS (if indicated):     Assets:  Communication Skills Desire for Improvement Financial Resources/Insurance Housing Resilience Social  Support Talents/Skills Transportation Vocational/Educational  ADL's:  Intact  Cognition:  WNL  Sleep:        Assessment and Plan: 1. Panic attack as reaction to stress - Vilazodone HCl (VIIBRYD) 40 MG TABS; Take 1 tablet (40 mg total) by mouth daily.  Dispense: 90 tablet; Refill: 0  2. PTSD (post-traumatic stress disorder) - mirtazapine (REMERON) 15 MG tablet; Take 1.5 tablets (22.5 mg total) by mouth at bedtime.  Dispense: 135 tablet; Refill: 0  3. Insomnia due to other mental disorder - Eszopiclone (ESZOPICLONE) 3 MG TABS; Take 1 tablet (3 mg total) by mouth at bedtime as needed. Take immediately before bedtime  Dispense: 15 tablet; Refill: 3 - mirtazapine (REMERON) 15 MG tablet; Take 1.5 tablets (22.5 mg total) by mouth at bedtime.  Dispense: 135 tablet; Refill: 0  4. Severe episode of recurrent major depressive disorder, without psychotic features (HCC) - mirtazapine (REMERON) 15 MG tablet; Take 1.5 tablets (22.5 mg total) by mouth at bedtime.  Dispense: 135 tablet; Refill: 0 - Vilazodone HCl (VIIBRYD) 40 MG TABS; Take 1 tablet (40 mg total) by mouth daily.  Dispense: 90 tablet; Refill: 0 - pt rarely takes Propranolol   5. Bulimia Nervosa   Completed TMS in 2020  She is engaging in therapy  Follow Up Instructions: In 3 months or sooner if needed- due to insurance not covering visits    I discussed the assessment and treatment plan with the patient. The patient was provided an opportunity to ask questions and all were answered. The patient agreed with the plan and demonstrated an  understanding of the instructions.   The patient was advised to call back or seek an in-person evaluation if the symptoms worsen or if the condition fails to improve as anticipated.  I provided 22 minutes of non-face-to-face time during this encounter.   Oletta Darter, MD

## 2020-07-15 ENCOUNTER — Other Ambulatory Visit: Payer: Self-pay | Admitting: Internal Medicine

## 2020-07-15 ENCOUNTER — Ambulatory Visit
Admission: RE | Admit: 2020-07-15 | Discharge: 2020-07-15 | Disposition: A | Discharge status: Home / Self Care | Payer: 59 | Source: Ambulatory Visit | Attending: Internal Medicine | Admitting: Internal Medicine

## 2020-07-15 DIAGNOSIS — Z720 Tobacco use: Secondary | ICD-10-CM

## 2020-07-30 ENCOUNTER — Other Ambulatory Visit: Payer: Self-pay

## 2020-07-30 ENCOUNTER — Telehealth (INDEPENDENT_AMBULATORY_CARE_PROVIDER_SITE_OTHER): Payer: 59 | Admitting: Psychiatry

## 2020-07-30 DIAGNOSIS — F99 Mental disorder, not otherwise specified: Secondary | ICD-10-CM

## 2020-07-30 DIAGNOSIS — F332 Major depressive disorder, recurrent severe without psychotic features: Secondary | ICD-10-CM | POA: Diagnosis not present

## 2020-07-30 DIAGNOSIS — F410 Panic disorder [episodic paroxysmal anxiety] without agoraphobia: Secondary | ICD-10-CM

## 2020-07-30 DIAGNOSIS — F430 Acute stress reaction: Secondary | ICD-10-CM

## 2020-07-30 DIAGNOSIS — F5105 Insomnia due to other mental disorder: Secondary | ICD-10-CM

## 2020-07-30 MED ORDER — VIIBRYD 40 MG PO TABS: 40.0000 mg | ORAL_TABLET | Freq: Every day | ORAL | 0 refills | Status: DC

## 2020-07-30 MED ORDER — PROPRANOLOL HCL 10 MG PO TABS: 10.0000 mg | ORAL_TABLET | Freq: Every day | ORAL | 1 refills | Status: DC | PRN

## 2020-07-30 MED ORDER — ESZOPICLONE 3 MG PO TABS: 3.0000 mg | ORAL_TABLET | Freq: Every evening | ORAL | 3 refills | Status: DC | PRN

## 2020-07-30 NOTE — Progress Notes (Signed)
Virtual Visit via Video Note  I connected with Monica Jensen on 07/30/20 at  9:00 AM EST by a video enabled telemedicine application and verified that I am speaking with the correct person using two identifiers.  Location: Patient: home Provider: office   I discussed the limitations of evaluation and management by telemedicine and the availability of in person appointments. The patient expressed understanding and agreed to proceed.  History of Present Illness: Monica Jensen has had a really tough 2 months. Her sister died suddenly of myocardial infarct the day after Thanksgiving. Her family and extended family had all been together for Thanksgiving. She spent the rest of the break handling her sister's affairs. Monica Jensen is grieving and some days are worse than other. Monica Jensen feels a lot of guilt for the boundaries she had to maintain with her. She is also very upset that her ex has not reached out to offer condolences. It makes her very angry and sad that he is behaving this way. School restarted 1 week ago. School remains very stressful and overwhelming. She was burned out last semester and Monica Jensen feels she is starting the semester off burned out. Monica Jensen has not been to focus because of all that is going on. Monica Jensen is having more bad days than good. She is having a lot of crying spells daily. She is questioning whether she should be in school and whether she can handle this. Monica Jensen is having a lot of self doubt right now. She doesn't know what else she would do if she did quit school. She is thinking of reaching to her advisor to see if the university offers any therapy services. She also planning on reaching out to her therapist soon. They have had to cancel several appointments for various reasons. Over the Christmas break she rarely took [place] but slept well. Since new year she has had to restart it because her worry keeps her up. Monica Jensen has stopped taking Remeron because it was making her groggy.  Propranolol has helped to calm her anxiety without making her tired. She denies SI/HI. Monica Jensen admits she is binging and purging more since her sister died.    Observations/Objective: Psychiatric Specialty Exam: ROS  Last menstrual period 06/03/2014.There is no height or weight on file to calculate BMI.  General Appearance: Casual and Neat  Eye Contact:  Good  Speech:  Clear and Coherent and Normal Rate  Volume:  Normal  Mood:  Depressed  Affect:  Congruent, Depressed and Tearful  Thought Process:  Coherent and Descriptions of Associations: Circumstantial  Orientation:  Full (Time, Place, and Person)  Thought Content:  Logical  Suicidal Thoughts:  No  Homicidal Thoughts:  No  Memory:  Immediate;   Good  Judgement:  Good  Insight:  Good  Psychomotor Activity:  Normal  Concentration:  Concentration: Fair  Recall:  Good  Fund of Knowledge:  Good  Language:  Good  Akathisia:  No  Handed:  Right  AIMS (if indicated):     Assets:  Communication Skills Desire for Improvement Financial Resources/Insurance Housing Resilience Social Support Talents/Skills Transportation Vocational/Educational  ADL's:  Intact  Cognition:  WNL  Sleep:        Assessment and Plan:  1. Insomnia due to other mental disorder - Eszopiclone (ESZOPICLONE) 3 MG TABS; Take 1 tablet (3 mg total) by mouth at bedtime as needed. Take immediately before bedtime  Dispense: 15 tablet; Refill: 3  2. Panic attack as reaction to stress - propranolol (INDERAL) 10 MG tablet; Take  1 tablet (10 mg total) by mouth daily as needed (anxiety).  Dispense: 30 tablet; Refill: 1 - Vilazodone HCl (VIIBRYD) 40 MG TABS; Take 1 tablet (40 mg total) by mouth daily.  Dispense: 90 tablet; Refill: 0  3. Severe episode of recurrent major depressive disorder, without psychotic features (HCC) - Vilazodone HCl (VIIBRYD) 40 MG TABS; Take 1 tablet (40 mg total) by mouth daily.  Dispense: 90 tablet; Refill: 0   D/c Remeron due to SE of  grogginess  Encouraged to restart therapy and offered options for grief therapy thru hospice or her university  Offered supportive therapy  Follow Up Instructions: In 4-6 weeks or sooner if needed    I discussed the assessment and treatment plan with the patient. The patient was provided an opportunity to ask questions and all were answered. The patient agreed with the plan and demonstrated an understanding of the instructions.   The patient was advised to call back or seek an in-person evaluation if the symptoms worsen or if the condition fails to improve as anticipated.  I provided 35 minutes of non-face-to-face time during this encounter.   Oletta Darter, MD

## 2020-08-27 ENCOUNTER — Other Ambulatory Visit: Payer: Self-pay

## 2020-08-27 ENCOUNTER — Telehealth (INDEPENDENT_AMBULATORY_CARE_PROVIDER_SITE_OTHER): Payer: 59 | Admitting: Psychiatry

## 2020-08-27 DIAGNOSIS — F99 Mental disorder, not otherwise specified: Secondary | ICD-10-CM

## 2020-08-27 DIAGNOSIS — F5105 Insomnia due to other mental disorder: Secondary | ICD-10-CM | POA: Diagnosis not present

## 2020-08-27 DIAGNOSIS — F502 Bulimia nervosa, unspecified: Secondary | ICD-10-CM

## 2020-08-27 DIAGNOSIS — F332 Major depressive disorder, recurrent severe without psychotic features: Secondary | ICD-10-CM

## 2020-08-27 DIAGNOSIS — F430 Acute stress reaction: Secondary | ICD-10-CM

## 2020-08-27 DIAGNOSIS — F410 Panic disorder [episodic paroxysmal anxiety] without agoraphobia: Secondary | ICD-10-CM | POA: Diagnosis not present

## 2020-08-27 MED ORDER — VIIBRYD 40 MG PO TABS: 40.0000 mg | ORAL_TABLET | Freq: Every day | ORAL | 0 refills | Status: DC

## 2020-08-27 MED ORDER — BUPROPION HCL ER (XL) 150 MG PO TB24: 150.0000 mg | ORAL_TABLET | ORAL | 2 refills | Status: DC

## 2020-08-27 MED ORDER — ESZOPICLONE 3 MG PO TABS: 3.0000 mg | ORAL_TABLET | Freq: Every evening | ORAL | 3 refills | Status: DC | PRN

## 2020-08-27 MED ORDER — PROPRANOLOL HCL 10 MG PO TABS: 10.0000 mg | ORAL_TABLET | Freq: Every day | ORAL | 0 refills | Status: DC | PRN

## 2020-08-27 NOTE — Progress Notes (Signed)
Monica Jensen is a 55 y.o. female in treatment for depression and displays the following risk factors for Suicide:  Demographic factors:  Divorced or widowed, Caucasian and Living alone Current Mental Status: Suicidal ideation and No plan to harm self or others Loss Factors: Loss of significant relationship Historical Factors: Family history of mental illness or substance abuse Risk Reduction Factors: Sense of responsibility to family, Positive social support, Positive therapeutic relationship and Positive coping skills or problem solving skills  CLINICAL FACTORS:  Severe Anxiety and/or Agitation Depression:   Anhedonia Insomnia Severe  COGNITIVE FEATURES THAT CONTRIBUTE TO RISK: Polarized thinking    SUICIDE RISK:  Moderate:  Frequent suicidal ideation with limited intensity, and duration, some specificity in terms of plans, no associated intent, good self-control, limited dysphoria/symptomatology, some risk factors present, and identifiable protective factors, including available and accessible social support.  Mental Status: see psych note  PLAN OF CARE: medication management Reviewed crisis plan Follow up in 1 week Engaging in individual and grief therapy every other week   Oletta Darter, MD 08/27/2020, 5:31 PM

## 2020-08-27 NOTE — Progress Notes (Signed)
Virtual Visit via Video Note  I connected with Monica Jensen on 08/27/20 at  2:00 PM EST by a video enabled telemedicine application and verified that I am speaking with the correct person using two identifiers.  Location: Patient: home Provider: office   I discussed the limitations of evaluation and management by telemedicine and the availability of in person appointments. The patient expressed understanding and agreed to proceed.  History of Present Illness: [name] shares that over the last one month her depression has significantly worse. She is spending many days in bed and is unable to make herself get up and do anything. As a result she is behind in school which is worsening her anxiety. She is having racing thoughts and inability to relax. She is having significant SI without plan or intent. She stated she was envious that her sister was dead. [name] has started grief therapy and is engaging in bi-weekly therapy. She denies HI.    Observations/Objective: Psychiatric Specialty Exam: ROS  Last menstrual period 06/03/2014.There is no height or weight on file to calculate BMI.  General Appearance: Casual and Neat  Eye Contact:  Good  Speech:  Clear and Coherent and Normal Rate  Volume:  Normal  Mood:  Anxious and Depressed  Affect:  Congruent, Depressed and Tearful  Thought Process:  Goal Directed, Linear and Descriptions of Associations: Intact  Orientation:  Full (Time, Place, and Person)  Thought Content:  Logical  Suicidal Thoughts:  No  Homicidal Thoughts:  No  Memory:  Immediate;   Good  Judgement:  Good  Insight:  Good  Psychomotor Activity:  Normal  Concentration:  Concentration: Good  Recall:  Good  Fund of Knowledge:  Good  Language:  Good  Akathisia:  No  Handed:  Right  AIMS (if indicated):     Assets:  Communication Skills Desire for Improvement Financial Resources/Insurance Housing Resilience Social  Support Talents/Skills Transportation Vocational/Educational  ADL's:  Intact  Cognition:  WNL  Sleep:        Assessment and Plan: 1. Insomnia due to other mental disorder - Eszopiclone (ESZOPICLONE) 3 MG TABS; Take 1 tablet (3 mg total) by mouth at bedtime as needed. Take immediately before bedtime  Dispense: 15 tablet; Refill: 3  2. Panic attack as reaction to stress - propranolol (INDERAL) 10 MG tablet; Take 1 tablet (10 mg total) by mouth daily as needed (anxiety).  Dispense: 90 tablet; Refill: 0 - Vilazodone HCl (VIIBRYD) 40 MG TABS; Take 1 tablet (40 mg total) by mouth daily.  Dispense: 90 tablet; Refill: 0  3. Severe episode of recurrent major depressive disorder, without psychotic features (HCC) - Start buPROPion (WELLBUTRIN XL) 150 MG 24 hr tablet; Take 1 tablet (150 mg total) by mouth every morning.  Dispense: 30 tablet; Refill: 2 - Vilazodone HCl (VIIBRYD) 40 MG TABS; Take 1 tablet (40 mg total) by mouth daily.  Dispense: 90 tablet; Refill: 0  4. Bulimia nervosa  Pt is engaging in grief therapy and individual therapy- she alternates between the 2 each week  Refused voluntar  inpatient psych admission. Pt does not meet criteria for IVC at this time. We reviewed the crisis plan and pt verbalized understanding.     Follow Up Instructions: In 1 week or sooner if needed   I discussed the assessment and treatment plan with the patient. The patient was provided an opportunity to ask questions and all were answered. The patient agreed with the plan and demonstrated an understanding of the instructions.  The patient was advised to call back or seek an in-person evaluation if the symptoms worsen or if the condition fails to improve as anticipated.   Oletta Darter, MD

## 2020-09-03 ENCOUNTER — Telehealth (INDEPENDENT_AMBULATORY_CARE_PROVIDER_SITE_OTHER): Payer: 59 | Admitting: Psychiatry

## 2020-09-03 ENCOUNTER — Other Ambulatory Visit: Payer: Self-pay

## 2020-09-03 DIAGNOSIS — F430 Acute stress reaction: Secondary | ICD-10-CM

## 2020-09-03 DIAGNOSIS — F332 Major depressive disorder, recurrent severe without psychotic features: Secondary | ICD-10-CM | POA: Diagnosis not present

## 2020-09-03 DIAGNOSIS — F5105 Insomnia due to other mental disorder: Secondary | ICD-10-CM

## 2020-09-03 DIAGNOSIS — F410 Panic disorder [episodic paroxysmal anxiety] without agoraphobia: Secondary | ICD-10-CM | POA: Diagnosis not present

## 2020-09-03 DIAGNOSIS — F99 Mental disorder, not otherwise specified: Secondary | ICD-10-CM | POA: Diagnosis not present

## 2020-09-03 NOTE — Progress Notes (Signed)
Virtual Visit via Video Note  I connected with Monica Jensen on 09/03/20 at 11:15 AM EST by a video enabled telemedicine application and verified that I am speaking with the correct person using two identifiers.  Location: Patient: home Provider: office   I discussed the limitations of evaluation and management by telemedicine and the availability of in person appointments. The patient expressed understanding and agreed to proceed.  History of Present Illness: [name] shares that her depression is mildly improving. She is still down and crying daily but it is not as intense. Her sleep is better as well.  Her anxiety is also improving and she is having less panic attacks. [name] denies any SI over the last 8 days. She denies HI. [name] is tolerating the Wellbutrin and wants to continue it.    Observations/Objective: Psychiatric Specialty Exam: ROS  Last menstrual period 06/03/2014.There is no height or weight on file to calculate BMI.  General Appearance: Casual  Eye Contact:  Good  Speech:  Clear and Coherent and Normal Rate  Volume:  Normal  Mood:  Depressed  Affect:  Congruent and Full Range  Thought Process:  Goal Directed, Linear and Descriptions of Associations: Intact  Orientation:  Full (Time, Place, and Person)  Thought Content:  Logical  Suicidal Thoughts:  No  Homicidal Thoughts:  No  Memory:  Immediate;   Good  Judgement:  Good  Insight:  Good  Psychomotor Activity:  Normal  Concentration:  Concentration: Good  Recall:  Good  Fund of Knowledge:  Good  Language:  Good  Akathisia:  No  Handed:  Right  AIMS (if indicated):     Assets:  Communication Skills Desire for Improvement Financial Resources/Insurance Housing Resilience Social Support Talents/Skills Transportation Vocational/Educational  ADL's:  Intact  Cognition:  WNL  Sleep:        Assessment and Plan: 1. Severe episode of recurrent major depressive disorder, without psychotic features (HCC) -  continue Viibryd 40mg  qD -Continue Wellbutrin XL 150mg  qD  2. Insomnia due to other mental disorder -continue Lunesta 3mg  po qHS prn  3. Panic attack as reaction to stress - continue Propranolol 10mg  po qD prn anxiety  - no refills today as pt got scripts for 90 days last week  - encouraged to continue individual and grief therapy  - talked about the importance of self care.    Follow Up Instructions: In 2-3 weeks or sooner if needed   I discussed the assessment and treatment plan with the patient. The patient was provided an opportunity to ask questions and all were answered. The patient agreed with the plan and demonstrated an understanding of the instructions.   The patient was advised to call back or seek an in-person evaluation if the symptoms worsen or if the condition fails to improve as anticipated.   , MD

## 2020-09-14 ENCOUNTER — Telehealth (HOSPITAL_COMMUNITY): Payer: Self-pay

## 2020-09-14 NOTE — Telephone Encounter (Signed)
Received fax from [facility] pharmacy stating that Viibryd 40mg  has been rejected and requires a PA. Do you want me to do a PA or do you want to switch her to Wellbutrin SR that doesn't require a pa. Please review and advise. Thank you

## 2020-09-17 NOTE — Telephone Encounter (Signed)
Yes please do PA for Viibryd

## 2020-09-18 ENCOUNTER — Other Ambulatory Visit (HOSPITAL_COMMUNITY): Payer: Self-pay

## 2020-09-18 NOTE — Telephone Encounter (Signed)
SUBMITTED PA FOR PATIENT'S VIIBRYD 40MG . WAITING FOR DETERMINATION

## 2020-09-22 ENCOUNTER — Telehealth (HOSPITAL_COMMUNITY): Payer: Self-pay

## 2020-09-22 NOTE — Telephone Encounter (Signed)
Received form for additional information from [employer]. Faxed form back and now waiting for determination on PA for patient's Viibryd 40mg  tablet

## 2020-09-23 ENCOUNTER — Telehealth (HOSPITAL_COMMUNITY): Payer: Self-pay

## 2020-09-23 NOTE — Telephone Encounter (Signed)
Received fax from [employer] I did a PA FOR PATIENT'S VIIBRYD 40MG  AND IT GOT DENIED. They sent me a form for additional information. I filled it out and faxed it back. Received a fax from them today (Wednesday, 3/16) that they have STILL DENIED COVERAGE OR PAYMENT ON PATIENT'S 4/16

## 2020-09-24 ENCOUNTER — Telehealth (HOSPITAL_COMMUNITY): Payer: Self-pay | Admitting: *Deleted

## 2020-09-24 ENCOUNTER — Other Ambulatory Visit: Payer: Self-pay

## 2020-09-24 ENCOUNTER — Telehealth (INDEPENDENT_AMBULATORY_CARE_PROVIDER_SITE_OTHER): Payer: 59 | Admitting: Psychiatry

## 2020-09-24 DIAGNOSIS — F332 Major depressive disorder, recurrent severe without psychotic features: Secondary | ICD-10-CM | POA: Diagnosis not present

## 2020-09-24 DIAGNOSIS — F430 Acute stress reaction: Secondary | ICD-10-CM

## 2020-09-24 DIAGNOSIS — F410 Panic disorder [episodic paroxysmal anxiety] without agoraphobia: Secondary | ICD-10-CM | POA: Diagnosis not present

## 2020-09-24 DIAGNOSIS — F502 Bulimia nervosa: Secondary | ICD-10-CM

## 2020-09-24 DIAGNOSIS — F4310 Post-traumatic stress disorder, unspecified: Secondary | ICD-10-CM | POA: Diagnosis not present

## 2020-09-24 MED ORDER — PROPRANOLOL HCL 10 MG PO TABS: 10.0000 mg | ORAL_TABLET | Freq: Two times a day (BID) | ORAL | 0 refills | Status: DC

## 2020-09-24 NOTE — Telephone Encounter (Signed)
Lets try to the PA paperwork again. Please see my note for today. If needed I will do a peer to peer review. Jolinda is requesting to be updated as the process goes along.

## 2020-09-24 NOTE — Progress Notes (Signed)
Virtual Visit via Telephone Note  I connected with [name] on 09/24/20 at  9:30 AM EDT by telephone and verified that I am speaking with the correct person using two identifiers.  Location: Patient: home Provider: office   I discussed the limitations, risks, security and privacy concerns of performing an evaluation and management service by telephone and the availability of in person appointments. I also discussed with the patient that there may be a patient responsible charge related to this service. The patient expressed understanding and agreed to proceed.   History of Present Illness: Encarnacion continues to feel detached and feels like she is observing her life from afar. She is depressed and has cries multiple times a day. She is emotionally sensitive. Auriella had a good time vacationing with friends last week but overall is endorsing anhedonia. She is endorsing hopelessness. Makaylee denies SI/HI but is endorsing passive thoughts of death. Her sleep is variable due to her school work but she would classify it as ok. She takes [place] a few times a month and it is effective. Her energy and concentration are poor and it is related to overwhelming school work. It has been difficult to adjust to her son being away in college and her sister's death. She is having binging episodes that lasts 4-6 days a week. Her PTSD is overall manageable. She has had a few random nightmares but they are not as bad as a few months ago. She is still being triggered by things around her. It causes high anxiety. The Wellbutrin is helping. She notes some small improvement in anxiety. Winston recently went to her PCP and gynecologist and her BP was elevated. Emilie feels she has a good response to [place] and would like to continue.    Observations/Objective:  General Appearance: unable to assess  Eye Contact:  unable to assess  Speech:  Clear and Coherent and Normal Rate  Volume:  Normal  Mood:  Anxious and  Depressed  Affect:  Congruent  Thought Process:  Coherent and Descriptions of Associations: Circumstantial  Orientation:  Full (Time, Place, and Person)  Thought Content:  Rumination  Suicidal Thoughts:  No  Homicidal Thoughts:  No  Memory:  Immediate;   Good  Judgement:  Good  Insight:  Good  Psychomotor Activity: unable to assess  Concentration:  Concentration: Good  Recall:  Good  Fund of Knowledge:  Good  Language:  Good  Akathisia:  unable to assess  Handed:  Right  AIMS (if indicated):     Assets:  Communication Skills Desire for Improvement Financial Resources/Insurance Housing Resilience Social Support Talents/Skills Transportation Vocational/Educational  ADL's:  unable to assess  Cognition:  WNL  Sleep:         Assessment and Plan: 1. Panic attack as reaction to stress  2. Severe episode of recurrent major depressive disorder, without psychotic features (HCC)  3. PTSD (post-traumatic stress disorder)  4. Bulimia nervosa  - continue Wellbutrin - will try to obtain PA again for Viibyrd - Lun[place] prn  Insurance has denied coverage of Viibryd despite her being treatment resistant depression to Remeron, Prozac, Celexa, Lexapro, Lithium, Abilify, Effexor- made her SI, Lamitcal, Buspar, Trintellix. She has also engaged in TMS which was only moderately helpful.   Wellbutrin seems to be increasing her BP so will start Propranolol 10mg  po BID (no longer PRN). Pt will check her BP at least 2x/week.   Brenetta continues to engage in therapy- it is helping but it would be more effective  in person. She continues to engage in grief and individual therapy.  Follow Up Instructions: In 4-6 weeks or sooner if needed   I discussed the assessment and treatment plan with the patient. The patient was provided an opportunity to ask questions and all were answered. The patient agreed with the plan and demonstrated an understanding of the instructions.   The patient was advised  to call back or seek an in-person evaluation if the symptoms worsen or if the condition fails to improve as anticipated.  I provided 30 minutes of non-face-to-face time during this encounter.   Oletta Darter, MD

## 2020-09-24 NOTE — Telephone Encounter (Signed)
Pa for Viibryd resubmitted via CoverMyMeds. Key [id number].

## 2020-10-01 ENCOUNTER — Telehealth (INDEPENDENT_AMBULATORY_CARE_PROVIDER_SITE_OTHER): Payer: 59 | Admitting: Psychiatry

## 2020-10-01 ENCOUNTER — Telehealth (HOSPITAL_COMMUNITY): Payer: Self-pay | Admitting: *Deleted

## 2020-10-01 ENCOUNTER — Other Ambulatory Visit: Payer: Self-pay

## 2020-10-01 DIAGNOSIS — F332 Major depressive disorder, recurrent severe without psychotic features: Secondary | ICD-10-CM | POA: Diagnosis not present

## 2020-10-01 DIAGNOSIS — F411 Generalized anxiety disorder: Secondary | ICD-10-CM | POA: Diagnosis not present

## 2020-10-01 DIAGNOSIS — F502 Bulimia nervosa: Secondary | ICD-10-CM | POA: Diagnosis not present

## 2020-10-01 DIAGNOSIS — F4310 Post-traumatic stress disorder, unspecified: Secondary | ICD-10-CM

## 2020-10-01 DIAGNOSIS — F99 Mental disorder, not otherwise specified: Secondary | ICD-10-CM

## 2020-10-01 DIAGNOSIS — F5105 Insomnia due to other mental disorder: Secondary | ICD-10-CM

## 2020-10-01 MED ORDER — BUPROPION HCL ER (XL) 150 MG PO TB24
150.0000 mg | ORAL_TABLET | ORAL | 2 refills | Status: DC
Start: 2020-10-01 — End: 2020-10-29

## 2020-10-01 NOTE — Telephone Encounter (Signed)
Yes Monica Jensen has failed Remeron, Prozac, Celexa, Lexapro, Lithium, Abilify, Effexor- made her SI, Lamitcal, Buspar, Trintellix. She has also engaged in TMS which was only moderately helpful.  She has failed SSRI (Prozac, Celexa, Lexapro) and SNRI (Effexor). This should be sufficient evidence for approval.

## 2020-10-01 NOTE — Progress Notes (Signed)
Virtual Visit via Telephone Note  I connected with [name] on 10/01/20 at  9:30 AM EDT by telephone and verified that I am speaking with the correct person using two identifiers.  Location: Patient: home Provider: office   I discussed the limitations, risks, security and privacy concerns of performing an evaluation and management service by telephone and the availability of in person appointments. I also discussed with the patient that there may be a patient responsible charge related to this service. The patient expressed understanding and agreed to proceed.   History of Present Illness: Monica Jensen is reporting an improvement in anxiety. She is feeling calmer. Annaly has been taking Propanolol BID and it seems to be helping with anxiety and controlling her BP. Over the last one week Greenleigh as checked her BP twice and they were both normal. Her depression continues and she still cries daily about her sister's death. Lilie denies SI/HI. As of yet Viibryd has not been approved. Her binging episodes continue but over the last one week the frequency decreased a little. She is sleeping ok with the time she has.    Observations/Objective:  General Appearance: unable to assess  Eye Contact:  unable to assess  Speech:  Clear and Coherent and Normal Rate  Volume:  Normal  Mood:  Anxious and Depressed  Affect:  Full Range  Thought Process:  Goal Directed, Linear and Descriptions of Associations: Intact  Orientation:  Full (Time, Place, and Person)  Thought Content:  Logical  Suicidal Thoughts:  No  Homicidal Thoughts:  No  Memory:  Immediate;   Good  Judgement:  Good  Insight:  Good  Psychomotor Activity: unable to assess  Concentration:  Concentration: Good  Recall:  Good  Fund of Knowledge:  Good  Language:  Good  Akathisia:  unable to assess  Handed:  Right  AIMS (if indicated):     Assets:  Communication Skills Desire for Improvement Financial  Resources/Insurance Housing Resilience Social Support Talents/Skills Transportation Vocational/Educational  ADL's:  unable to assess  Cognition:  WNL  Sleep:         Assessment and Plan: 1. Severe episode of recurrent major depressive disorder, without psychotic features (HCC)  2. PTSD (post-traumatic stress disorder)  3. GAD (generalized anxiety disorder)  4. Bulimia nervosa  5. Insomnia due to other mental disorder   Continue  - Wellbutrin XL -Take Propranolol BID for BP and anxiety - Lunest prn - continue appeal for Viibryd- now waiting on review from insurance company  She continues to engage in therapy  Follow Up Instructions: In 6 weeks or sooner if needed   I discussed the assessment and treatment plan with the patient. The patient was provided an opportunity to ask questions and all were answered. The patient agreed with the plan and demonstrated an understanding of the instructions.   The patient was advised to call back or seek an in-person evaluation if the symptoms worsen or if the condition fails to improve as anticipated.  I provided 19 minutes of non-face-to-face time during this encounter.   Oletta Darter, MD

## 2020-10-01 NOTE — Telephone Encounter (Signed)
Appeal for PA on Viibryd has been denied due to not having documentation that pt has tried and failed, with intolerance, and qualifying medical reason for generic serotonin and norepinephrine reuptake inhibitors. Although it is noted that pt tried and failed Celexa. [employer] [phone]. Do you want [place] to submit another appeal? Also we don't have enough of the Viibryd samples to accommodate dosage of 40 mg. Writer has reached out to rep but has not received a call back.

## 2020-10-02 ENCOUNTER — Telehealth (HOSPITAL_COMMUNITY): Payer: Self-pay | Admitting: *Deleted

## 2020-10-02 NOTE — Telephone Encounter (Signed)
I will f/u on that. Did not do PA or original appeal so wasn't familiar with her case. Will do.

## 2020-10-02 NOTE — Telephone Encounter (Signed)
Pa for Viibryd resubmitted through [employer]. Awaiting response.

## 2020-10-06 ENCOUNTER — Telehealth (HOSPITAL_COMMUNITY): Payer: Self-pay | Admitting: *Deleted

## 2020-10-06 NOTE — Telephone Encounter (Signed)
Late entry for 10/02/20 1200: Pt came by office to pick up available samples of Viibryd 10mg /20mg  starter packs. 3 cards given.

## 2020-10-06 NOTE — Telephone Encounter (Signed)
PA for Viibryd 40mg  resubmitted by this . Fax received approving medication through 10/01/21. PA # 10/03/21 per [employer]. Pt aware.

## 2020-10-18 ENCOUNTER — Other Ambulatory Visit (HOSPITAL_COMMUNITY): Payer: Self-pay | Admitting: Psychiatry

## 2020-10-18 DIAGNOSIS — F332 Major depressive disorder, recurrent severe without psychotic features: Secondary | ICD-10-CM

## 2020-11-12 ENCOUNTER — Telehealth (INDEPENDENT_AMBULATORY_CARE_PROVIDER_SITE_OTHER): Payer: 59 | Admitting: Psychiatry

## 2020-11-12 ENCOUNTER — Other Ambulatory Visit: Payer: Self-pay

## 2020-11-12 DIAGNOSIS — F5105 Insomnia due to other mental disorder: Secondary | ICD-10-CM | POA: Diagnosis not present

## 2020-11-12 DIAGNOSIS — F99 Mental disorder, not otherwise specified: Secondary | ICD-10-CM | POA: Diagnosis not present

## 2020-11-12 DIAGNOSIS — F430 Acute stress reaction: Secondary | ICD-10-CM

## 2020-11-12 DIAGNOSIS — F332 Major depressive disorder, recurrent severe without psychotic features: Secondary | ICD-10-CM

## 2020-11-12 DIAGNOSIS — F410 Panic disorder [episodic paroxysmal anxiety] without agoraphobia: Secondary | ICD-10-CM

## 2020-11-12 MED ORDER — BUPROPION HCL ER (XL) 150 MG PO TB24: ORAL_TABLET | ORAL | 0 refills | Status: DC

## 2020-11-12 MED ORDER — VIIBRYD 40 MG PO TABS: 40.0000 mg | ORAL_TABLET | Freq: Every day | ORAL | 0 refills | Status: DC

## 2020-11-12 MED ORDER — PROPRANOLOL HCL 10 MG PO TABS: 10.0000 mg | ORAL_TABLET | Freq: Two times a day (BID) | ORAL | 0 refills | Status: DC

## 2020-11-12 MED ORDER — ESZOPICLONE 3 MG PO TABS: 3.0000 mg | ORAL_TABLET | Freq: Every evening | ORAL | 2 refills | Status: DC | PRN

## 2020-11-12 NOTE — Progress Notes (Signed)
Virtual Visit via Video Note  I connected with Monica Jensen on 11/12/20 at  9:30 AM EDT by a video enabled telemedicine application and verified that I am speaking with the correct person using two identifiers.  Location: Patient: home Provider: office   I discussed the limitations of evaluation and management by telemedicine and the availability of in person appointments. The patient expressed understanding and agreed to proceed.  History of Present Illness: Monica Jensen shares that she is doing ok. She is taking it day by day. She has been feeling stronger and stronger every day. She finished her 1st semester and thinks she may have gotten all A's. She has 3 weeks off. Monica Jensen is leaving later today to go the beach with friends. She is also going to [place] after getting back. She really wants to leave Monica Jensen eventually. Her son is coming home for the summer and she is really excited about it. A lot of good things are happening.  The combination of Wellbutrin and Propranolol are working to manage her anxiety so that she can be productive. She was not having panic attacks due to the amount of stress she was having during finals. Her depression is slowly improving over the last 1 month. In the last 2 weeks she has only felt depressed 4 days. Monica Jensen is going thru the phase of healing. She has made some realizations about relationships. Monica Jensen is happy to be moving forward. Even though her social circle has shrunk she is happy that she has trust worthy friends now. Monica Jensen continues to engage in grief counseling and it really helping. Her sleep is good. Her energy is fair. She is still binging about 3x/week. Monica Jensen is happy to be getting away because she does not engage in binging while around others. She denies SI/HI. Her PTSD is slowly improving. She has had a few nights of nightmares in the last few weeks. She is still encountering triggers which leads to intrusive memories. The good thing is she  recovering quicker. She is taking the Viibryd as it was approved by insurance. Her BP has been stable.     Observations/Objective: Psychiatric Specialty Exam: ROS  Last menstrual period 06/03/2014.There is no height or weight on file to calculate BMI.  General Appearance: Casual  Eye Contact:  Good  Speech:  Clear and Coherent and Normal Rate  Volume:  Normal  Mood:  Euthymic  Affect:  Full Range  Thought Process:  Goal Directed, Linear and Descriptions of Associations: Circumstantial  Orientation:  Full (Time, Place, and Person)  Thought Content:  Logical  Suicidal Thoughts:  No  Homicidal Thoughts:  No  Memory:  Immediate;   Good  Judgement:  Good  Insight:  Good  Psychomotor Activity:  Normal  Concentration:  Concentration: Good  Recall:  Good  Fund of Knowledge:  Good  Language:  Good  Akathisia:  No  Handed:  Right  AIMS (if indicated):     Assets:  Communication Skills Desire for Improvement Financial Resources/Insurance Housing Resilience Social Support Talents/Skills Transportation Vocational/Educational  ADL's:  Intact  Cognition:  WNL  Sleep:        Assessment and Plan: Depression screen [facility] 2/9 09/24/2020 09/03/2020 08/27/2020  Decreased Interest 3 1 3   Down, Depressed, Hopeless 3 3 3   PHQ - 2 Score 6 4 6   Altered sleeping 1 - 3  Tired, decreased energy 3 - 3  Change in appetite 3 - 3  Feeling bad or failure about yourself  3 - 3  Trouble concentrating 3 - 3  Moving slowly or fidgety/restless 0 - 0  Suicidal thoughts 3 - 3  PHQ-9 Score 22 - 24  Difficult doing work/chores Very difficult - Extremely dIfficult   Flowsheet Row Video Visit from 09/24/2020 in [facility] Video Visit from 09/03/2020 in [facility] Video Visit from 08/27/2020 in [facility]  C-SSRS RISK CATEGORY Error: Q3, 4, or 5 should not be populated when Q2 is No Error:  Q3, 4, or 5 should not be populated when Q2 is No Error: Q7 should not be populated when Q6 is No     1. Insomnia due to other mental disorder - Eszopiclone (ESZOPICLONE) 3 MG TABS; Take 1 tablet (3 mg total) by mouth at bedtime as needed. Take immediately before bedtime  Dispense: 15 tablet; Refill: 2  2. Panic attack as reaction to stress - propranolol (INDERAL) 10 MG tablet; Take 1 tablet (10 mg total) by mouth 2 (two) times daily.  Dispense: 180 tablet; Refill: 0 - Vilazodone HCl (VIIBRYD) 40 MG TABS; Take 1 tablet (40 mg total) by mouth daily.  Dispense: 90 tablet; Refill: 0  3. Severe episode of recurrent major depressive disorder, without psychotic features (HCC) - Vilazodone HCl (VIIBRYD) 40 MG TABS; Take 1 tablet (40 mg total) by mouth daily.  Dispense: 90 tablet; Refill: 0 - buPROPion (WELLBUTRIN XL) 150 MG 24 hr tablet; TAKE 1 TABLET(150 MG) BY MOUTH EVERY MORNING  Dispense: 90 tablet; Refill: 0    Follow Up Instructions: In 2-3 months or sooner if needed   I discussed the assessment and treatment plan with the patient. The patient was provided an opportunity to ask questions and all were answered. The patient agreed with the plan and demonstrated an understanding of the instructions.   The patient was advised to call back or seek an in-person evaluation if the symptoms worsen or if the condition fails to improve as anticipated.    Oletta Darter, MD

## 2020-12-31 ENCOUNTER — Telehealth (HOSPITAL_COMMUNITY): Payer: Self-pay

## 2020-12-31 NOTE — Telephone Encounter (Signed)
Patient called requesting a refill on her Viibryd 40mg . I returned her call but she didn't pick up so I LVM to inform her that her provider had sent in a 90 day supply on 5/5 to the requested pharmacy so she shouldn't be out until August. She has a followup appointment scheduled for 7/7.  I also called the pharmacy to verify that.   PHARMACY IS FILLING IT AND PT WILL BE NOTIFIED WHEN IT'S READY TO BE PICKED UP

## 2021-01-14 ENCOUNTER — Other Ambulatory Visit: Payer: Self-pay

## 2021-01-14 ENCOUNTER — Telehealth (INDEPENDENT_AMBULATORY_CARE_PROVIDER_SITE_OTHER): Payer: 59 | Admitting: Psychiatry

## 2021-01-14 DIAGNOSIS — F430 Acute stress reaction: Secondary | ICD-10-CM

## 2021-01-14 DIAGNOSIS — F5105 Insomnia due to other mental disorder: Secondary | ICD-10-CM | POA: Diagnosis not present

## 2021-01-14 DIAGNOSIS — F410 Panic disorder [episodic paroxysmal anxiety] without agoraphobia: Secondary | ICD-10-CM | POA: Diagnosis not present

## 2021-01-14 DIAGNOSIS — F4310 Post-traumatic stress disorder, unspecified: Secondary | ICD-10-CM

## 2021-01-14 DIAGNOSIS — F502 Bulimia nervosa: Secondary | ICD-10-CM

## 2021-01-14 DIAGNOSIS — F411 Generalized anxiety disorder: Secondary | ICD-10-CM

## 2021-01-14 DIAGNOSIS — F99 Mental disorder, not otherwise specified: Secondary | ICD-10-CM

## 2021-01-14 DIAGNOSIS — F332 Major depressive disorder, recurrent severe without psychotic features: Secondary | ICD-10-CM

## 2021-01-14 MED ORDER — REXULTI 0.5 MG PO TABS: 0.5000 mg | ORAL_TABLET | Freq: Every day | ORAL | 0 refills | Status: DC

## 2021-01-14 MED ORDER — VILAZODONE HCL 40 MG PO TABS: 40.0000 mg | ORAL_TABLET | Freq: Every day | ORAL | 0 refills | Status: DC

## 2021-01-14 MED ORDER — PROPRANOLOL HCL 10 MG PO TABS: 10.0000 mg | ORAL_TABLET | Freq: Two times a day (BID) | ORAL | 0 refills | Status: DC

## 2021-01-14 MED ORDER — BUPROPION HCL ER (XL) 150 MG PO TB24
ORAL_TABLET | ORAL | 0 refills | Status: DC
Start: 2021-01-14 — End: 2021-02-18

## 2021-01-14 MED ORDER — ESZOPICLONE 3 MG PO TABS: 3.0000 mg | ORAL_TABLET | Freq: Every evening | ORAL | 2 refills | Status: DC | PRN

## 2021-01-14 NOTE — Progress Notes (Signed)
Virtual Visit via Telephone Note  I connected with Monica Jensen on 01/14/21 at 10:00 AM EDT by telephone and verified that I am speaking with the correct person using two identifiers.  Location: Patient: home Provider: office   I discussed the limitations, risks, security and privacy concerns of performing an evaluation and management service by telephone and the availability of in person appointments. I also discussed with the patient that there may be a patient responsible charge related to this service. The patient expressed understanding and agreed to proceed.   History of Present Illness: "It has not been the best time. I have really been struggling recently". She had oral surgery last week and she is in a lot pain. There is a lot going on.  She continues to grieve the loss of her sister. She is engaging in grief therapy with hospice. Monica Jensen got a letter from her best friend and it was shocking. It causes to her throw up and cry. Her son became worried and asked if he should call an ambulance. Her son told her she was just lying there without moving and Monica Jensen does not remember 1.5 hrs during that time. The next week she was out with friends and she suddenly started crying and her friends had to pull her out of the restaurant. Monica Jensen does not remember any of it. Her friends were so worried that they were going to take her to the ED but Monica Jensen came to and started interacting with them again. Both episodes have scared her and she doesn't know what to do. Her depression is worse due the stress of losing her best friend. Monica Jensen is taking 2 intense classes. She is doing ok for the most part. Monica Jensen is studying most days. She has no motivation or energy and is spending most of her time in bed. She is sleeping a lot. Each night she wakes up "soaking wet and in a panic". She does not know why but is usually able to fall back asleep. It could be nightmares but she is not sure. Monica Jensen has ongoing anxiety  with worry and ruminations. Her appetite is poor. She was binging before the oral surgery but since has not due to pain. She has on/off SI without plan or intent several times a week. She feels so stuck and really want move out of [place]. Monica Jensen's mom does not want to move and Monica Jensen feels obligated to take care of her mom. She has been talking about her issues in therapy. Monica Jensen is having stress induced panic attacks several times a week. At times Monica Jensen feels hopeless about getting better.   Observations/Objective:  General Appearance: unable to assess  Eye Contact:  unable to assess  Speech:  Clear and Coherent and Normal Rate  Volume:  Normal  Mood:  Anxious and Depressed  Affect:  Depressed and Tearful  Thought Process:  Goal Directed, Linear, and Descriptions of Associations: Intact  Orientation:  Full (Time, Place, and Person)  Thought Content:  Logical  Suicidal Thoughts:  Yes.  without intent/plan  Homicidal Thoughts:  No  Memory:  Immediate;   Good  Judgement:  Good  Insight:  Good  Psychomotor Activity: unable to assess  Concentration:  Concentration: Good  Recall:  Good  Fund of Knowledge:  Good  Language:  Good  Akathisia:  unable to assess  Handed:  unable to assess  AIMS (if indicated):     Assets:  Communication Skills Desire for Improvement Financial Resources/Insurance Housing Resilience Social Support  Talents/Skills Transportation Vocational/Educational  ADL's:  unable to assess  Cognition:  WNL  Sleep:        Assessment and Plan: Depression screen [facility] 2/9 01/14/2021 11/12/2020 09/24/2020 09/03/2020 08/27/2020  Decreased Interest 1 0 3 1 3   Down, Depressed, Hopeless 3 1 3 3 3   PHQ - 2 Score 4 1 6 4 6   Altered sleeping 3 0 1 - 3  Tired, decreased energy 3 0 3 - 3  Change in appetite 3 2 3  - 3  Feeling bad or failure about yourself  3 1 3  - 3  Trouble concentrating 0 0 3 - 3  Moving slowly or fidgety/restless 1 0 0 - 0  Suicidal thoughts 3 0 3 - 3  PHQ-9  Score 20 4 22  - 24  Difficult doing work/chores Extremely dIfficult Somewhat difficult Very difficult - Extremely dIfficult    Flowsheet Row Video Visit from 01/14/2021 in [facility] Video Visit from 11/12/2020 in [facility] Video Visit from 09[facility]24/2020 in [facility]  C-SSRS RISK CATEGORY Error: Q7 should not be populated when Q6 is No Error: Q3, 4, or 5 should not be populated when Q2 is No Error: Q3, 4, or 5 should not be populated when Q2 is No      R/o dissociative episodes  She has talked to her therapy about doing an IOP but the therapist is concerned about cost. Her therapist is referring her to someone who does EMDR.   -start Rexulti 0.5mg  qD for MDD  1. Severe episode of recurrent major depressive disorder, without psychotic features (HCC) - buPROPion (WELLBUTRIN XL) 150 MG 24 hr tablet; TAKE 1 TABLET(150 MG) BY MOUTH EVERY MORNING  Dispense: 90 tablet; Refill: 0 - Vilazodone HCl (VIIBRYD) 40 MG TABS; Take 1 tablet (40 mg total) by mouth daily.  Dispense: 90 tablet; Refill: 0 - Brexpiprazole (REXULTI) 0.5 MG TABS; Take 1 tablet (0.5 mg total) by mouth daily.  Dispense: 90 tablet; Refill: 0  2. Insomnia due to other mental disorder - eszopiclone 3 MG TABS; Take 1 tablet (3 mg total) by mouth at bedtime as needed. Take immediately before bedtime  Dispense: 15 tablet; Refill: 2-- rarely takes it  3. Panic attack as reaction to stress - propranolol (INDERAL) 10 MG tablet; Take 1 tablet (10 mg total) by mouth 2 (two) times daily.  Dispense: 180 tablet; Refill: 0 - Vilazodone HCl (VIIBRYD) 40 MG TABS; Take 1 tablet (40 mg total) by mouth daily.  Dispense: 90 tablet; Refill: 0  4. Bulimia nervosa  5. PTSD (post-traumatic stress disorder)  6. GAD (generalized anxiety disorder)     Follow Up Instructions: In 1-2 months or sooner if needed   I discussed the  assessment and treatment plan with the patient. The patient was provided an opportunity to ask questions and all were answered. The patient agreed with the plan and demonstrated an understanding of the instructions.   The patient was advised to call back or seek an in-person evaluation if the symptoms worsen or if the condition fails to improve as anticipated.  I provided 29 minutes of non-face-to-face time during this encounter.   , MD

## 2021-01-22 ENCOUNTER — Telehealth (HOSPITAL_COMMUNITY): Payer: Self-pay | Admitting: *Deleted

## 2021-01-22 NOTE — Telephone Encounter (Signed)
Appeal for Rexulti 0.5 mg submitted to CoverMyMeds. Awaiting determination.

## 2021-01-25 ENCOUNTER — Telehealth (HOSPITAL_COMMUNITY): Payer: Self-pay | Admitting: *Deleted

## 2021-01-25 NOTE — Telephone Encounter (Signed)
PA for Rexulti 0.5 mg approved from 01/25/21 thru 01/24/22 by [employer].

## 2021-02-18 ENCOUNTER — Telehealth (INDEPENDENT_AMBULATORY_CARE_PROVIDER_SITE_OTHER): Payer: 59 | Admitting: Psychiatry

## 2021-02-18 ENCOUNTER — Other Ambulatory Visit: Payer: Self-pay

## 2021-02-18 ENCOUNTER — Encounter (HOSPITAL_COMMUNITY): Payer: Self-pay | Admitting: Psychiatry

## 2021-02-18 DIAGNOSIS — F410 Panic disorder [episodic paroxysmal anxiety] without agoraphobia: Secondary | ICD-10-CM | POA: Diagnosis not present

## 2021-02-18 DIAGNOSIS — F5105 Insomnia due to other mental disorder: Secondary | ICD-10-CM | POA: Diagnosis not present

## 2021-02-18 DIAGNOSIS — F17200 Nicotine dependence, unspecified, uncomplicated: Secondary | ICD-10-CM

## 2021-02-18 DIAGNOSIS — F430 Acute stress reaction: Secondary | ICD-10-CM

## 2021-02-18 DIAGNOSIS — F332 Major depressive disorder, recurrent severe without psychotic features: Secondary | ICD-10-CM | POA: Diagnosis not present

## 2021-02-18 DIAGNOSIS — F99 Mental disorder, not otherwise specified: Secondary | ICD-10-CM

## 2021-02-18 MED ORDER — VILAZODONE HCL 40 MG PO TABS
40.0000 mg | ORAL_TABLET | Freq: Every day | ORAL | 0 refills | Status: DC
Start: 2021-02-18 — End: 2021-04-15

## 2021-02-18 MED ORDER — BUPROPION HCL ER (XL) 150 MG PO TB24: ORAL_TABLET | ORAL | 0 refills | Status: DC

## 2021-02-18 MED ORDER — VILAZODONE HCL 40 MG PO TABS: 40.0000 mg | ORAL_TABLET | Freq: Every day | ORAL | 0 refills | Status: DC

## 2021-02-18 MED ORDER — PROPRANOLOL HCL 10 MG PO TABS: 10.0000 mg | ORAL_TABLET | Freq: Two times a day (BID) | ORAL | 0 refills | Status: DC

## 2021-02-18 NOTE — Progress Notes (Signed)
Virtual Visit via Video Note  I connected with Monica Jensen on 02/18/21 at  3:30 PM EDT by a video enabled telemedicine application and verified that I am speaking with the correct person using two identifiers.  Location: Patient: home Provider: office   I discussed the limitations of evaluation and management by telemedicine and the availability of in person appointments. The patient expressed understanding and agreed to proceed.  History of Present Illness: "Things are actually going pretty well". Therapy and grief counseling have really been helping. She has also joined a grief support group for people who have lost siblings. She went to the beach with family 2 weeks ago and it was very therapeutic. They had a ceremony on the beach on her sister's birthday. "I am feeling really good and really strong right now. I can't explain it". She has not yet started Rexalti after it was approved. [name] just got it filled late last week. She is wondering if she really needs to start it right now or not. Her PTSD is better. She is not getting triggered by mentions of her ex- fiance. She has finished summer school and got straight A's. 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 [name] has not cried in over a week. She has also come to the realization that its ok to cry. She finds herself humming to herself. Her mood feels stable. Over the last 14 days she has only been depressed about 7 days. Her interest in activities has increased. She is sleeping a lot more and is always tired. It does not feel like it is due to depression. She has not needed Lunesta in a while now. [name] still struggling with binging episodes. It happens about 3x/weeks and each episode lasts for 24 hrs. She will typically purge after 24 hrs. [name] then feels ok for 1-2 days and it starts all over again. It could be due to feeling lonely and having little motivation to cook for only 1 person.  She denies SI/HI.    Observations/Objective: Psychiatric Specialty Exam: ROS  Last menstrual period 06/03/2014.There is no height or weight on file to calculate BMI.  General Appearance: Casual and Neat  Eye Contact:  Good  Speech:  Clear and Coherent and Normal Rate  Volume:  Normal  Mood:  Euthymic  Affect:  Full Range  Thought Process:  Goal Directed, Linear, and Descriptions of Associations: Intact  Orientation:  Full (Time, Place, and Person)  Thought Content:  Logical  Suicidal Thoughts:  No  Homicidal Thoughts:  No  Memory:  Immediate;   Good  Judgement:  Good  Insight:  Good  Psychomotor Activity:  Normal  Concentration:  Concentration: Good  Recall:  Good  Fund of Knowledge:  Good  Language:  Good  Akathisia:  No  Handed:  Right  AIMS (if indicated):     Assets:  Communication Skills Desire for Improvement Financial Resources/Insurance Housing Resilience Social Support Talents/Skills Transportation Vocational/Educational  ADL's:  Intact  Cognition:  WNL  Sleep:        Assessment and Plan: Depression screen [facility] 2/9 02/18/2021 01/14/2021 11/12/2020 09/24/2020 09/03/2020  Decreased Interest 1 1 0 3 1  Down, Depressed, Hopeless 2 3 1 3 3   PHQ - 2 Score 3 4 1 6 4   Altered sleeping 0 3 0 1 -  Tired, decreased energy 3 3 0 3 -  Change in appetite 2 3 2 3  -  Feeling bad or failure about  yourself  2 3 1 3  -  Trouble concentrating 0 0 0 3 -  Moving slowly or fidgety/restless 0 1 0 0 -  Suicidal thoughts 0 3 0 3 -  PHQ-9 Score 10 20 4 22  -  Difficult doing work/chores Extremely dIfficult Extremely dIfficult Somewhat difficult Very difficult -    Flowsheet Row Video [facility]/18[facility] Video Visit from 01/14/2021 in [facility] Video Visit from 11/12/2020 in [facility]  C-SSRS RISK CATEGORY Error: Q3, 4, or 5 should not be populated  when Q2 is No Error: Q7 should not be populated when Q6 is No Error: Q3, 4, or 5 should not be populated when Q2 is No        D/c Rexulti as she never started it and her mood is stable.  1. Severe episode of recurrent major depressive disorder, without psychotic features (HCC) - buPROPion (WELLBUTRIN XL) 150 MG 24 hr tablet; TAKE 1 TABLET(150 MG) BY MOUTH EVERY MORNING  Dispense: 90 tablet; Refill: 0 - Vilazodone HCl (VIIBRYD) 40 MG TABS; Take 1 tablet (40 mg total) by mouth daily.  Dispense: 90 tablet; Refill: 0  2. Insomnia due to other mental disorder - continue Lunesta, no refill today as pt states she still has 2 refills on her previous prescription   3. Panic attack as reaction to stress - propranolol (INDERAL) 10 MG tablet; Take 1 tablet (10 mg total) by mouth 2 (two) times daily.  Dispense: 180 tablet; Refill: 0 - Vilazodone HCl (VIIBRYD) 40 MG TABS; Take 1 tablet (40 mg total) by mouth daily.  Dispense: 90 tablet; Refill: 0  4. Smoking cessation - Wellbutrin. She is down to smoking a few times a week. It takes 2 weeks to finish 1 pack of cigs.   Follow Up Instructions: In 2-3 months or sooner if needed   I discussed the assessment and treatment plan with the patient. The patient was provided an opportunity to ask questions and all were answered. The patient agreed with the plan and demonstrated an understanding of the instructions.   The patient was advised to call back or seek an in-person evaluation if the symptoms worsen or if the condition fails to improve as anticipated.  I provided 23 minutes of non-face-to-face time during this encounter.   03/17/2021, MD

## 2021-04-05 ENCOUNTER — Other Ambulatory Visit (HOSPITAL_COMMUNITY): Payer: Self-pay | Admitting: Psychiatry

## 2021-04-05 DIAGNOSIS — F332 Major depressive disorder, recurrent severe without psychotic features: Secondary | ICD-10-CM

## 2021-04-15 ENCOUNTER — Other Ambulatory Visit: Payer: Self-pay

## 2021-04-15 ENCOUNTER — Ambulatory Visit (HOSPITAL_BASED_OUTPATIENT_CLINIC_OR_DEPARTMENT_OTHER): Payer: 59 | Admitting: Psychiatry

## 2021-04-15 DIAGNOSIS — F332 Major depressive disorder, recurrent severe without psychotic features: Secondary | ICD-10-CM

## 2021-04-15 DIAGNOSIS — F502 Bulimia nervosa: Secondary | ICD-10-CM

## 2021-04-15 DIAGNOSIS — F430 Acute stress reaction: Secondary | ICD-10-CM

## 2021-04-15 DIAGNOSIS — F17200 Nicotine dependence, unspecified, uncomplicated: Secondary | ICD-10-CM

## 2021-04-15 DIAGNOSIS — F5105 Insomnia due to other mental disorder: Secondary | ICD-10-CM

## 2021-04-15 DIAGNOSIS — F99 Mental disorder, not otherwise specified: Secondary | ICD-10-CM

## 2021-04-15 DIAGNOSIS — F410 Panic disorder [episodic paroxysmal anxiety] without agoraphobia: Secondary | ICD-10-CM | POA: Diagnosis not present

## 2021-04-15 DIAGNOSIS — F411 Generalized anxiety disorder: Secondary | ICD-10-CM

## 2021-04-15 MED ORDER — LISDEXAMFETAMINE DIMESYLATE 20 MG PO CAPS: 20.0000 mg | ORAL_CAPSULE | Freq: Every day | ORAL | 0 refills | Status: DC

## 2021-04-15 MED ORDER — PROPRANOLOL HCL 10 MG PO TABS: 10.0000 mg | ORAL_TABLET | Freq: Two times a day (BID) | ORAL | 0 refills | Status: DC

## 2021-04-15 MED ORDER — BUPROPION HCL ER (XL) 150 MG PO TB24
ORAL_TABLET | ORAL | 0 refills | Status: DC
Start: 2021-04-15 — End: 2021-07-15

## 2021-04-15 MED ORDER — VILAZODONE HCL 40 MG PO TABS: 40.0000 mg | ORAL_TABLET | Freq: Every day | ORAL | 0 refills | Status: DC

## 2021-04-15 NOTE — Progress Notes (Signed)
Virtual Visit via Telephone Note  I connected with [name] on 04/15/21 at  1:15 PM EDT by telephone and verified that I am speaking with the correct person using two identifiers. We were unable to connect by video today.  Location: Patient: home Provider: office   I discussed the limitations, risks, security and privacy concerns of performing an evaluation and management service by telephone and the availability of in person appointments. I also discussed with the patient that there may be a patient responsible charge related to this service. The patient expressed understanding and agreed to proceed.   History of Present Illness: "I'm doing ok for the most part". She has been so busy with school that she is overwhelmed. She is mentally and physically exhausted due to the work load and schedule. She is working most days and then doing group projects from 9pm- midnight. 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  Gordana shares that she has no time to be overly depressed and anxiety. Lynsay does not go out much due to her schedule but when she does go out if she has any alcohol she ends up crying due to feeling sad. She shares that she doesn't allow herself time to think about her personal stressors but it seems like when she does have time she is overly emotional. Tammatha shares that when she binges she doesn't allow herself to think about anything else. Her binging episodes have increased to 4 times a week. It usually happens at night so the next morning she purges. She then starves herself all day and ends up binging the next night again. This is a cycle that she has had in the past. Jennye is seeing signs that suggest this could be getting worse. In 2 weeks she is going back to [place] to attend the alumni retreat program for 6 days. She will get a refresher sessions for trauma. She is really looking forward to it and thinks it will help with her recovery. This thanksgiving will be the 1  year anniversay of her sister's death. Tangala still feels overwhelming grief and is still going to grief therapy. Sonyia is very scared that her mom is going to pass and then she will have no family or support. She denies SI/HI.    Observations/Objective:  General Appearance: unable to assess  Eye Contact:  unable to assess  Speech:  Clear and Coherent and Normal Rate  Volume:  Normal  Mood:  Anxious and Depressed  Affect:  Full Range  Thought Process:  Goal Directed, Linear, and Descriptions of Associations: Intact  Orientation:  Full (Time, Place, and Person)  Thought Content:  Logical  Suicidal Thoughts:  No  Homicidal Thoughts:  No  Memory:  Immediate;   Good  Judgement:  Good  Insight:  Good  Psychomotor Activity: unable to assess  Concentration:  Concentration: Good  Recall:  Good  Fund of Knowledge:  Good  Language:  Good  Akathisia:  unable to assess  Handed:  unable to assess  AIMS (if indicated):     Assets:  Communication Skills Desire for Improvement Financial Resources/Insurance Housing Resilience Social Support Talents/Skills Transportation Vocational/Educational  ADL's:  unable to assess  Cognition:  WNL  Sleep:        Assessment and Plan:  Start Vyvanse for Bulimia   1. Severe episode of recurrent major depressive disorder, without psychotic features (HCC) - Vilazodone HCl (VIIBRYD) 40 MG TABS; Take 1 tablet (40 mg total) by mouth daily.  Dispense: 90 tablet; Refill: 0 - buPROPion (WELLBUTRIN XL) 150 MG 24 hr tablet; TAKE 1 TABLET(150 MG) BY MOUTH EVERY MORNING  Dispense: 90 tablet; Refill: 0  2. Panic attack as reaction to stress - propranolol (INDERAL) 10 MG tablet; Take 1 tablet (10 mg total) by mouth 2 (two) times daily.  Dispense: 180 tablet; Refill: 0  3. Insomnia due to other mental disorder  4. Bulimia nervosa - lisdexamfetamine (VYVANSE) 20 MG capsule; Take 1 capsule (20 mg total) by mouth daily.  Dispense: 30 capsule; Refill: 0 -  lisdexamfetamine (VYVANSE) 20 MG capsule; Take 1 capsule (20 mg total) by mouth daily.  Dispense: 30 capsule; Refill: 0  5. Nicotine use disorder - buPROPion (WELLBUTRIN XL) 150 MG 24 hr tablet; TAKE 1 TABLET(150 MG) BY MOUTH EVERY MORNING  Dispense: 90 tablet; Refill: 0  6. GAD (generalized anxiety disorder) - propranolol (INDERAL) 10 MG tablet; Take 1 tablet (10 mg total) by mouth 2 (two) times daily.  Dispense: 180 tablet; Refill: 0   She is cutting back on cigarettes and is able to skips days in a row.   Follow Up Instructions: In 2-3 months or sooner if needed   I discussed the assessment and treatment plan with the patient. The patient was provided an opportunity to ask questions and all were answered. The patient agreed with the plan and demonstrated an understanding of the instructions.   The patient was advised to call back or seek an in-person evaluation if the symptoms worsen or if the condition fails to improve as anticipated.  I provided 25 minutes of non-face-to-face time during this encounter.   Oletta Darter, MD

## 2021-05-16 ENCOUNTER — Other Ambulatory Visit (HOSPITAL_COMMUNITY): Payer: Self-pay | Admitting: Psychiatry

## 2021-05-16 DIAGNOSIS — F430 Acute stress reaction: Secondary | ICD-10-CM

## 2021-05-16 DIAGNOSIS — F17200 Nicotine dependence, unspecified, uncomplicated: Secondary | ICD-10-CM

## 2021-05-16 DIAGNOSIS — F410 Panic disorder [episodic paroxysmal anxiety] without agoraphobia: Secondary | ICD-10-CM

## 2021-05-16 DIAGNOSIS — F411 Generalized anxiety disorder: Secondary | ICD-10-CM

## 2021-05-16 DIAGNOSIS — F332 Major depressive disorder, recurrent severe without psychotic features: Secondary | ICD-10-CM

## 2021-05-20 ENCOUNTER — Other Ambulatory Visit: Payer: Self-pay

## 2021-05-20 ENCOUNTER — Telehealth (HOSPITAL_BASED_OUTPATIENT_CLINIC_OR_DEPARTMENT_OTHER): Payer: 59 | Admitting: Psychiatry

## 2021-05-20 DIAGNOSIS — F411 Generalized anxiety disorder: Secondary | ICD-10-CM

## 2021-05-20 DIAGNOSIS — F5105 Insomnia due to other mental disorder: Secondary | ICD-10-CM

## 2021-05-20 DIAGNOSIS — F332 Major depressive disorder, recurrent severe without psychotic features: Secondary | ICD-10-CM | POA: Diagnosis not present

## 2021-05-20 DIAGNOSIS — F17200 Nicotine dependence, unspecified, uncomplicated: Secondary | ICD-10-CM | POA: Diagnosis not present

## 2021-05-20 DIAGNOSIS — F502 Bulimia nervosa: Secondary | ICD-10-CM | POA: Diagnosis not present

## 2021-05-20 DIAGNOSIS — F410 Panic disorder [episodic paroxysmal anxiety] without agoraphobia: Secondary | ICD-10-CM

## 2021-05-20 DIAGNOSIS — F430 Acute stress reaction: Secondary | ICD-10-CM

## 2021-05-20 DIAGNOSIS — F99 Mental disorder, not otherwise specified: Secondary | ICD-10-CM

## 2021-05-20 MED ORDER — LISDEXAMFETAMINE DIMESYLATE 20 MG PO CAPS
20.0000 mg | ORAL_CAPSULE | Freq: Every day | ORAL | 0 refills | Status: DC
Start: 2021-05-20 — End: 2021-07-15

## 2021-05-20 MED ORDER — ESZOPICLONE 3 MG PO TABS: 3.0000 mg | ORAL_TABLET | Freq: Every evening | ORAL | 2 refills | Status: DC | PRN

## 2021-05-20 MED ORDER — LISDEXAMFETAMINE DIMESYLATE 20 MG PO CAPS
20.0000 mg | ORAL_CAPSULE | Freq: Every day | ORAL | 0 refills | Status: DC
Start: 2021-05-20 — End: 2021-09-16

## 2021-05-20 NOTE — Progress Notes (Signed)
Virtual Visit via Video Note  I connected with Monica Jensen on 05/20/21 at  9:30 AM EST by a video enabled telemedicine application and verified that I am speaking with the correct person using two identifiers.  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 The patient expressed understanding and agreed to proceed.  History of Present Illness: "It's going ok". The last 2 weeks she has been a "bad funk". She was in [place] for 1 week for the reunion of her therapy retreat. Monica Jensen notes that she feels so much better anytime she is out of [place]. The day before or day of she is to return to [facility] she feels upset and anxious. Her ex-fiance got married while she was in [place]. She was not aware and saw the pictures on Facebook. It effected her a lot but she had a lot of support in [place] which helped. This allowed her to deal with it much better. Due to this she was in a funk after coming back to [place]. Monica Jensen had bronchitis a few days after returning to [place]. She was very sick for 1 week. Monica Jensen realizes that her physical health could have contributed to her mental health. She unintentionally missed several days of meds while sick. This week she is feeling much better both mentally and physically. Her sleep and energy are slowly improving. Monica Jensen is trying to deal with the 1 year anniversary of her sister's death. Her whole family has decided to go out of town for Thanksgiving as a way to help deal with the anniversary. She feels mostly grief rather than depression right now. She gets really sad and cries but doesn't feel hopeless or SI/HI. Over the last week her binging episodes are a little better. While in [place] she did really well. While sick she didn't have any appetite. This week she had 1 binging episode even with the Vyvanse.  Monica Jensen has  a plan to help deal with it moving forward. Monica Jensen would like to find a therapist who meets clients in person. She is still seeing a Camera operator.    Observations/Objective: Psychiatric Specialty Exam: ROS  Last menstrual period 06/03/2014.There is no height or weight on file to calculate BMI.  General Appearance: Neat and Well Groomed  Eye Contact:  Good  Speech:  Clear and Coherent and Normal Rate  Volume:  Normal  Mood:  Depressed  Affect:  Full Range  Thought Process:  Coherent and Descriptions of Associations: Circumstantial  Orientation:  Full (Time, Place, and Person)  Thought Content:  Logical  Suicidal Thoughts:  No  Homicidal Thoughts:  No  Memory:  Immediate;   Good  Judgement:  Good  Insight:  Good  Psychomotor Activity:  Normal  Concentration:  Concentration: Good  Recall:  Good  Fund of Knowledge:  Good  Language:  Good  Akathisia:  No  Handed:  Right  AIMS (if indicated):     Assets:  Communication Skills Desire for Improvement Financial Resources/Insurance Housing Resilience Social Support Talents/Skills Transportation Vocational/Educational  ADL's:  Intact  Cognition:  WNL  Sleep:        Assessment and Plan:  - engaging in therapy- individual and grief  - encouraged to start support groups with [facility] and utilize hospice. She is coming up on the 1 year anniversary of her sister's death and we discussed ways to cope.   -  [name] feels this combination of meds is effective. She finds that if she misses a dose of Viibryd or Propranolol she notices quickly  - continue Viibryd, Propranolol, Wellbutrin  1. Severe episode of recurrent major depressive disorder, without psychotic features (HCC)  2. Nicotine use disorder  3. Insomnia due to other mental disorder - eszopiclone 3 MG TABS; Take 1 tablet (3 mg total) by mouth at bedtime as needed. Take immediately before bedtime  Dispense: 15 tablet; Refill: 2  4. Bulimia nervosa -  lisdexamfetamine (VYVANSE) 20 MG capsule; Take 1 capsule (20 mg total) by mouth daily.  Dispense: 30 capsule; Refill: 0 - lisdexamfetamine (VYVANSE) 20 MG capsule; Take 1 capsule (20 mg total) by mouth daily.  Dispense: 30 capsule; Refill: 0  5. Panic attack as reaction to stress  6. GAD (generalized anxiety disorder)  Depression screen [facility] 2/9 05/20/2021 02/18/2021 01/14/2021 11/12/2020 09/24/2020  Decreased Interest 2 1 1  0 3  Down, Depressed, Hopeless 2 2 3 1 3   PHQ - 2 Score 4 3 4 1 6   Altered sleeping 1 0 3 0 1  Tired, decreased energy 2 3 3  0 3  Change in appetite 1 2 3 2 3   Feeling bad or failure about yourself  1 2 3 1 3   Trouble concentrating 0 0 0 0 3  Moving slowly or fidgety/restless 0 0 1 0 0  Suicidal thoughts 0 0 3 0 3  PHQ-9 Score 9 10 20 4 22   Difficult doing work/chores Very difficult Extremely dIfficult Extremely dIfficult Somewhat difficult Very difficult    Flowsheet Row Video Visit from 05/20/2021 in [facility] Video Visit from 02/18/2021 in [facility] Video Visit from 01/14/2021 in [facility]  C-SSRS RISK CATEGORY No Risk Error: Q3, 4, or 5 should not be populated when Q2 is No Error: Q7 should not be populated when Q6 is No        Follow Up Instructions: In 1-2 months or sooner if needed   I discussed the assessment and treatment plan with the patient. The patient was provided an opportunity to ask questions and all were answered. The patient agreed with the plan and demonstrated an understanding of the instructions.   The patient was advised to call back or seek an in-person evaluation if the symptoms worsen or if the condition fails to improve as anticipated.  I provided 30 minutes of non-face-to-face time during this encounter.   , MD

## 2021-06-23 ENCOUNTER — Other Ambulatory Visit (HOSPITAL_COMMUNITY): Payer: Self-pay | Admitting: Psychiatry

## 2021-06-23 DIAGNOSIS — F332 Major depressive disorder, recurrent severe without psychotic features: Secondary | ICD-10-CM

## 2021-07-15 ENCOUNTER — Other Ambulatory Visit: Payer: Self-pay

## 2021-07-15 ENCOUNTER — Telehealth (HOSPITAL_BASED_OUTPATIENT_CLINIC_OR_DEPARTMENT_OTHER): Payer: 59 | Admitting: Psychiatry

## 2021-07-15 DIAGNOSIS — F430 Acute stress reaction: Secondary | ICD-10-CM

## 2021-07-15 DIAGNOSIS — F17200 Nicotine dependence, unspecified, uncomplicated: Secondary | ICD-10-CM

## 2021-07-15 DIAGNOSIS — F410 Panic disorder [episodic paroxysmal anxiety] without agoraphobia: Secondary | ICD-10-CM | POA: Diagnosis not present

## 2021-07-15 DIAGNOSIS — F502 Bulimia nervosa: Secondary | ICD-10-CM

## 2021-07-15 DIAGNOSIS — F332 Major depressive disorder, recurrent severe without psychotic features: Secondary | ICD-10-CM | POA: Diagnosis not present

## 2021-07-15 DIAGNOSIS — F411 Generalized anxiety disorder: Secondary | ICD-10-CM | POA: Diagnosis not present

## 2021-07-15 MED ORDER — VIIBRYD 40 MG PO TABS: ORAL_TABLET | ORAL | 0 refills | Status: DC

## 2021-07-15 MED ORDER — BUPROPION HCL ER (XL) 150 MG PO TB24: ORAL_TABLET | ORAL | 0 refills | Status: DC

## 2021-07-15 MED ORDER — PROPRANOLOL HCL 10 MG PO TABS: 10.0000 mg | ORAL_TABLET | Freq: Two times a day (BID) | ORAL | 0 refills | Status: DC

## 2021-07-15 MED ORDER — LISDEXAMFETAMINE DIMESYLATE 20 MG PO CAPS: 20.0000 mg | ORAL_CAPSULE | Freq: Every day | ORAL | 0 refills | Status: DC
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
## 2021-07-21 ENCOUNTER — Telehealth (HOSPITAL_COMMUNITY): Payer: Self-pay

## 2021-07-21 NOTE — Telephone Encounter (Signed)
error 

## 2021-07-21 NOTE — Telephone Encounter (Signed)
[place] 20MG  CAPSULE PA # [id number] EFFECTIVE 07/21/2021 TO 07/21/2022  S/W Sherilyn Cooter

## 2021-07-23 ENCOUNTER — Other Ambulatory Visit (HOSPITAL_COMMUNITY): Payer: Self-pay | Admitting: Psychiatry

## 2021-07-23 DIAGNOSIS — F332 Major depressive disorder, recurrent severe without psychotic features: Secondary | ICD-10-CM

## 2021-07-23 DIAGNOSIS — F17200 Nicotine dependence, unspecified, uncomplicated: Secondary | ICD-10-CM

## 2021-09-09 ENCOUNTER — Other Ambulatory Visit: Payer: Self-pay

## 2021-09-09 ENCOUNTER — Telehealth (HOSPITAL_BASED_OUTPATIENT_CLINIC_OR_DEPARTMENT_OTHER): Payer: 59 | Admitting: Psychiatry

## 2021-09-09 DIAGNOSIS — F332 Major depressive disorder, recurrent severe without psychotic features: Secondary | ICD-10-CM

## 2021-09-09 DIAGNOSIS — F430 Acute stress reaction: Secondary | ICD-10-CM

## 2021-09-09 DIAGNOSIS — F410 Panic disorder [episodic paroxysmal anxiety] without agoraphobia: Secondary | ICD-10-CM

## 2021-09-09 DIAGNOSIS — F411 Generalized anxiety disorder: Secondary | ICD-10-CM

## 2021-09-09 DIAGNOSIS — F502 Bulimia nervosa: Secondary | ICD-10-CM
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
## 2021-09-14 ENCOUNTER — Telehealth (HOSPITAL_COMMUNITY): Payer: Self-pay | Admitting: *Deleted

## 2021-09-14 NOTE — Telephone Encounter (Signed)
Received message from pt requesting that the Vyvanse be changed to a less expensive option. Pt says that she's in [employer] school and simply cannot afford. Pt stated that Focalin has woeked for her in the past and wasn't as expensive as the Vyvanse. Pt next appointment scheduled for 09/30/21. Please review and advise.  ?

## 2021-09-16 ENCOUNTER — Other Ambulatory Visit (HOSPITAL_COMMUNITY): Payer: Self-pay | Admitting: Psychiatry

## 2021-09-16 ENCOUNTER — Telehealth (HOSPITAL_COMMUNITY): Payer: Self-pay | Admitting: *Deleted

## 2021-09-16 MED ORDER — DEXMETHYLPHENIDATE HCL ER 20 MG PO CP24: 20.0000 mg | ORAL_CAPSULE | Freq: Every day | ORAL | 0 refills | Status: DC

## 2021-09-16 NOTE — Telephone Encounter (Signed)
Writer LVM for pt regarding dosage on the Focalin. Awaiting return call. FYI. ?

## 2021-09-16 NOTE — Progress Notes (Signed)
D/c Vyvanse due to cost. Monica Jensen reports doing well on Focalin XR in the past so will try it again. ?

## 2021-09-16 NOTE — Telephone Encounter (Signed)
Pt returned writer's call regarding Focalin dose. Pt says that she thinks she was on 30 mg but it was the XR, not IR, so taken qd. Please review.  ?

## 2021-09-21 ENCOUNTER — Telehealth (HOSPITAL_COMMUNITY): Payer: Self-pay | Admitting: *Deleted

## 2021-09-21 NOTE — Telephone Encounter (Signed)
PA FOR VIIBRYD 40 MG TABLETS HAS BEEN APPROVED FROM 09/20/21/ TO 09/21/22. ?

## 2021-09-21 NOTE — Telephone Encounter (Signed)
PA FOR VIIBRYD 40 MG TABLETS SUBMITTED AND AWAITING REVIEW.  ? ?PA CASE ID#- [id number]. ?

## 2021-09-30 ENCOUNTER — Other Ambulatory Visit: Payer: Self-pay

## 2021-09-30 ENCOUNTER — Telehealth (HOSPITAL_BASED_OUTPATIENT_CLINIC_OR_DEPARTMENT_OTHER): Payer: 59 | Admitting: Psychiatry

## 2021-09-30 DIAGNOSIS — F17200 Nicotine dependence, unspecified, uncomplicated: Secondary | ICD-10-CM

## 2021-09-30 DIAGNOSIS — F502 Bulimia nervosa, unspecified: Secondary | ICD-10-CM

## 2021-09-30 DIAGNOSIS — F411 Generalized anxiety disorder: Secondary | ICD-10-CM | POA: Diagnosis not present

## 2021-09-30 DIAGNOSIS — F430 Acute stress reaction: Secondary | ICD-10-CM

## 2021-09-30 DIAGNOSIS — F332 Major depressive disorder, recurrent severe without psychotic features: Secondary | ICD-10-CM

## 2021-09-30 DIAGNOSIS — F410 Panic disorder [episodic paroxysmal anxiety] without agoraphobia: Secondary | ICD-10-CM

## 2021-09-30 MED ORDER — BUPROPION HCL ER (XL) 150 MG PO TB24: ORAL_TABLET | ORAL | 0 refills | Status: DC

## 2021-09-30 MED ORDER — DEXMETHYLPHENIDATE HCL ER 20 MG PO CP24: 20.0000 mg | ORAL_CAPSULE | Freq: Every day | ORAL | 0 refills | Status: DC

## 2021-09-30 MED ORDER — VIIBRYD 40 MG PO TABS: ORAL_TABLET | ORAL | 0 refills | Status: DC

## 2021-09-30 MED ORDER — PROPRANOLOL HCL 10 MG PO TABS: 10.0000 mg | ORAL_TABLET | Freq: Two times a day (BID) | ORAL | 0 refills | Status: DC
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
## 2021-12-09 ENCOUNTER — Telehealth (HOSPITAL_BASED_OUTPATIENT_CLINIC_OR_DEPARTMENT_OTHER): Payer: Commercial Managed Care - HMO | Admitting: Psychiatry

## 2021-12-09 ENCOUNTER — Encounter (HOSPITAL_COMMUNITY): Payer: Self-pay | Admitting: Psychiatry

## 2021-12-09 DIAGNOSIS — F411 Generalized anxiety disorder: Secondary | ICD-10-CM

## 2021-12-09 DIAGNOSIS — F4310 Post-traumatic stress disorder, unspecified: Secondary | ICD-10-CM

## 2021-12-09 DIAGNOSIS — F410 Panic disorder [episodic paroxysmal anxiety] without agoraphobia: Secondary | ICD-10-CM

## 2021-12-09 DIAGNOSIS — F430 Acute stress reaction: Secondary | ICD-10-CM

## 2021-12-09 DIAGNOSIS — F332 Major depressive disorder, recurrent severe without psychotic features: Secondary | ICD-10-CM

## 2021-12-09 DIAGNOSIS — F502 Bulimia nervosa: Secondary | ICD-10-CM

## 2021-12-09 DIAGNOSIS — F17200 Nicotine dependence, unspecified, uncomplicated: Secondary | ICD-10-CM

## 2021-12-09 MED ORDER — VIIBRYD 40 MG PO TABS: ORAL_TABLET | ORAL | 0 refills | Status: DC

## 2021-12-09 MED ORDER — PRAZOSIN HCL 1 MG PO CAPS: 1.0000 mg | ORAL_CAPSULE | Freq: Every day | ORAL | 0 refills | Status: DC

## 2021-12-09 MED ORDER — PROPRANOLOL HCL 10 MG PO TABS: 10.0000 mg | ORAL_TABLET | Freq: Two times a day (BID) | ORAL | 0 refills | Status: DC

## 2021-12-09 MED ORDER — DEXMETHYLPHENIDATE HCL ER 20 MG PO CP24: 20.0000 mg | ORAL_CAPSULE | Freq: Every day | ORAL | 0 refills | Status: DC

## 2021-12-09 MED ORDER — BUPROPION HCL ER (XL) 150 MG PO TB24: ORAL_TABLET | ORAL | 0 refills | Status: DC

## 2021-12-09 NOTE — Progress Notes (Signed)
Virtual Visit via Video Note  I connected with [name] on 12/09/21 at  1:00 PM EDT by a video enabled telemedicine application and verified that I am speaking with the correct person using two identifiers.  Location: Patient: home Provider: office   I discussed the limitations of evaluation and management by telemedicine and the availability of in person appointments. The patient expressed understanding and agreed to proceed.  History of Present Illness: "I'm ok. I won't say I am fantastic". Keira has graduated from her [employer]. It is was a really special day and she celebrated with family and friends. She felt so good that entire the weekend. Now she is feeling kinda lazy since she has planned to take a few weeks to relax. Abbygale is still berating herself for taking this time. She has not lined up a job yet but is searching for something she would feel is right for her. She needs to apply for her license soon. Cheria had a hearing for the DUI right after graduation. At that time he sent someone who was not familiar with her case. They ended up continuing her case for another month. She is frustrated. Her depression has been "ok" and a little bit better due to graduating. Mckynlee has been sleeping without any meds. She has been having more nightmares (almost every night) an night terrors (2-3x/week). She has been having a re-occurring about a snake biting in the neck. She feels the dream is a manifestation of her being silenced and not being able to speak her mind. Izabell is not napping during the day. She is very tearful and is not sure if it is due to depression. It comes on suddenly and is not under her control. In the last one month she has been depressed at least half the time. Aprille denies isolation and went to the beach with friends about 2 weeks ago. She is very excited about the future and denies SI/HI. Her anxiety has been ok except when she wakes up from a nightmares. She  wakes up and has a panic attacks but not otherwise. She is not having any other PTSD symptoms. Savon shares that since she has graduated and has been out of town more. This has decreased her frequency of binging as she does not engage in it in front of others. It is now happening 2-3x/week. Mary-Anne is having a lot of pain in her legs. She denies SI/HI. ADHD is well controlled with Focalin. Cuca has a cigarette with alcohol but otherwise does not. In May she finished 2 packs of cigarettes.   Observations/Objective: Psychiatric Specialty Exam: ROS  Last menstrual period 06/03/2014.There is no height or weight on file to calculate BMI.  General Appearance: Casual and Neat  Eye Contact:  Good  Speech:  Clear and Coherent and Normal Rate  Volume:  Normal  Mood:  Depressed  Affect:  Full Range  Thought Process:  Goal Directed, Linear, and Descriptions of Associations: Intact  Orientation:  Full (Time, Place, and Person)  Thought Content:  Logical  Suicidal Thoughts:  No  Homicidal Thoughts:  No  Memory:  Immediate;   Good  Judgement:  Good  Insight:  Good  Psychomotor Activity:  Normal  Concentration:  Concentration: Good  Recall:  Good  Fund of Knowledge:  Good  Language:  Good  Akathisia:  No  Handed:  Right  AIMS (if indicated):     Assets:  Communication Skills Desire for Improvement Financial Resources/Insurance Housing Leisure Time  Resilience Social Support Energy manager  ADL's:  Intact  Cognition:  WNL  Sleep:        Assessment and Plan:     12/09/2021    1:17 PM 09/09/2021    9:23 AM 09/09/2021    9:18 AM 05/20/2021    9:44 AM 02/18/2021    3:49 PM  Depression screen PHQ 2/9  Decreased Interest 0 3  2 1   Down, Depressed, Hopeless 2 3 3 2 2   PHQ - 2 Score 2 6 3 4 3   Altered sleeping 3 3  1  0  Tired, decreased energy 3 3  2 3   Change in appetite 2 2  1 2   Feeling bad or failure about yourself  2 3  1 2   Trouble  concentrating 0 3  0 0  Moving slowly or fidgety/restless 0 0  0 0  Suicidal thoughts 0 3  0 0  PHQ-9 Score 12 23  9 10   Difficult doing work/chores Very difficult Extremely dIfficult  Very difficult Extremely dIfficult    Flowsheet Row Video Visit from 12/09/2021 in [facility] Video Visit from 09/09/2021 in [facility] Video Visit from 05/20/2021 in [facility]  C-SSRS RISK CATEGORY No Risk Error: Q3, 4, or 5 should not be populated when Q2 is No No Risk        The risk of un-intended pregnancy is low based on the fact that pt reports she is post menopausal. Pt is aware that these meds carry a teratogenic risk. Pt will discuss plan of action if she does or plans to become pregnant in the future.  Status of current problems: ongoing depression and anxiety  Meds:  Start trial of Prazosin 1mg  po qHS  1. Severe episode of recurrent major depressive disorder, without psychotic features (HCC) - buPROPion (WELLBUTRIN XL) 150 MG 24 hr tablet; TAKE 1 TABLET BY MOUTH( 150 MG) EVERY MORNING  Dispense: 90 tablet; Refill: 0 - VIIBRYD 40 MG TABS; TAKE 1 TABLET(40 MG) BY MOUTH DAILY  Dispense: 90 tablet; Refill: 0  2. Bulimia nervosa - dexmethylphenidate (FOCALIN XR) 20 MG 24 hr capsule; Take 1 capsule (20 mg total) by mouth daily.  Dispense: 30 capsule; Refill: 0 - dexmethylphenidate (FOCALIN XR) 20 MG 24 hr capsule; Take 1 capsule (20 mg total) by mouth daily.  Dispense: 30 capsule; Refill: 0  3. GAD (generalized anxiety disorder) - propranolol (INDERAL) 10 MG tablet; Take 1 tablet (10 mg total) by mouth 2 (two) times daily.  Dispense: 180 tablet; Refill: 0  4. Panic attack as reaction to stress - propranolol (INDERAL) 10 MG tablet; Take 1 tablet (10 mg total) by mouth 2 (two) times daily.  Dispense: 180 tablet; Refill: 0  5. PTSD (post-traumatic stress disorder) - prazosin  (MINIPRESS) 1 MG capsule; Take 1 capsule (1 mg total) by mouth at bedtime.  Dispense: 30 capsule; Refill: 0  6. Nicotine use disorder - buPROPion (WELLBUTRIN XL) 150 MG 24 hr tablet; TAKE 1 TABLET BY MOUTH( 150 MG) EVERY MORNING  Dispense: 90 tablet; Refill: 0     Therapy: brief supportive therapy provided. Discussed psychosocial stressors in detail.   She is meeting with her therapist regularly.    Collaboration of Care: Other none today  Patient/Guardian was advised Release of Information must be obtained prior to any record release in order to collaborate their care with an outside provider. Patient/Guardian was advised if they have not already done so  to contact the registration department to sign all necessary forms in order for us to release information regarding their care.   Consent: Patient/Guardian gives verbal consent for treatment and assignment of benefits for services provided during this visit. Patient/Guardian expressed understanding and agreed to proceed.     Follow Up Instructions: Follow up in 2-3 months or sooner if needed    I discussed the assessment and treatment plan with the patient. The patient was provided an opportunity to ask questions and all were answered. The patient agreed with the plan and demonstrated an understanding of the instructions.   The patient was advised to call back or seek an in-person evaluation if the symptoms worsen or if the condition fails to improve as anticipated.  I provided 26 minutes of non-face-to-face time during this encounter.   Oletta DarterSalina Shaina Gullatt, MD

## 2022-02-17 ENCOUNTER — Telehealth (HOSPITAL_COMMUNITY): Payer: Self-pay

## 2022-02-17 ENCOUNTER — Telehealth (HOSPITAL_BASED_OUTPATIENT_CLINIC_OR_DEPARTMENT_OTHER): Payer: Commercial Managed Care - HMO | Admitting: Psychiatry

## 2022-02-17 DIAGNOSIS — F332 Major depressive disorder, recurrent severe without psychotic features: Secondary | ICD-10-CM | POA: Diagnosis not present

## 2022-02-17 DIAGNOSIS — F502 Bulimia nervosa: Secondary | ICD-10-CM | POA: Diagnosis not present

## 2022-02-17 DIAGNOSIS — F430 Acute stress reaction: Secondary | ICD-10-CM

## 2022-02-17 DIAGNOSIS — F411 Generalized anxiety disorder: Secondary | ICD-10-CM

## 2022-02-17 DIAGNOSIS — F17200 Nicotine dependence, unspecified, uncomplicated: Secondary | ICD-10-CM | POA: Diagnosis not present

## 2022-02-17 DIAGNOSIS — F410 Panic disorder [episodic paroxysmal anxiety] without agoraphobia: Secondary | ICD-10-CM

## 2022-02-17 MED ORDER — PROPRANOLOL HCL 10 MG PO TABS: 10.0000 mg | ORAL_TABLET | Freq: Two times a day (BID) | ORAL | 0 refills | Status: DC

## 2022-02-17 MED ORDER — DEXMETHYLPHENIDATE HCL ER 10 MG PO CP24: 20.0000 mg | ORAL_CAPSULE | Freq: Every day | ORAL | 0 refills | Status: DC

## 2022-02-17 MED ORDER — VIIBRYD 40 MG PO TABS: ORAL_TABLET | ORAL | 0 refills | Status: DC

## 2022-02-17 MED ORDER — BUPROPION HCL ER (XL) 150 MG PO TB24: ORAL_TABLET | ORAL | 0 refills | Status: DC

## 2022-02-17 NOTE — Progress Notes (Signed)
Virtual Visit via Video Note  I connected with Monica Jensen on 02/17/22 at 10:30 AM EDT by   a video enabled telemedicine application and verified that I am speaking with the correct person using two identifiers.  Location: Patient: home Provider: office   I discussed the limitations of evaluation and management by telemedicine and the availability of in person appointments. The patient expressed understanding and agreed to proceed.  History of Present Illness: Monica Jensen has had a good and bad summer. She has suffered from heat exhaustion in early June at the pool. Monica Jensen went to the ED. A few weeks later she found a tick on her scalp. She pulled it out and the area was painful for weeks after. After she had random bouts of extreme fatigue where she couldn't get out of bed at times. She called her PCP and started antibiotics and was having a lot of side effects. She just finished it about 1.5 weeks ago. Monica Jensen is having some dental problems as well. She feels like physically she has lot a lot of stamina. Other than that she is doing ok. Monica Jensen has not yet submitted her license yet and when she tries she has a panic attack. Last week she was convicted of DWI from [place] incident. On 03/23/22 she will be sentenced and will likely lose her drivers license for 45 days with no privileges. After 45 days she will have limited driving privileges.  Monica Jensen has been spending time with good friends. Her former best friend contacted her and asked to talk but Monica Jensen has decided against it. Monica Jensen is trying to Clinical research associate her a letter to express her thoughts and feelings. Her depression was ok and manageable until last Monday when she was convicted with the DWI. She feels stuck because her mom told her not to leave [place] as long as she is alive. She really wants to leave and is feeling ostracized by her old friends and ex. She gets triggered by things involving them. She will feel extreme anxiety for days. She denies  any night terrors and stopped the Prazosin after 2 weeks. Monica Jensen is sleeping more lately and it could be due to her physical health.  She has not been able to get the Focalin in a couple of months due to lack of inventory at the prescribed dose. She is endorsing ongoing negative self talk. She continues to binge 1-2x/week. She denies SI/HI and passive thoughts of death.    Observations/Objective: Psychiatric Specialty Exam: ROS  Last menstrual period 06/03/2014.There is no height or weight on file to calculate BMI.  General Appearance: Casual and Fairly Groomed  Eye Contact:  Good  Speech:  Clear and Coherent and Normal Rate  Volume:  Normal  Mood:  Anxious and Depressed  Affect:  Full Range  Thought Process:  Goal Directed, Linear, and Descriptions of Associations: Intact  Orientation:  Full (Time, Place, and Person)  Thought Content:  Logical  Suicidal Thoughts:  No  Homicidal Thoughts:  No  Memory:  Immediate;   Good  Judgement:  Good  Insight:  Good  Psychomotor Activity:  Normal  Concentration:  Concentration: Good  Recall:  Good  Fund of Knowledge:  Good  Language:  Good  Akathisia:  No  Handed:  Right  AIMS (if indicated):     Assets:  Communication Skills Desire for Improvement Financial Resources/Insurance Housing Leisure Time Resilience Social Support Talents/Skills Transportation Vocational/Educational  ADL's:  Intact  Cognition:  WNL  Sleep:  Assessment and Plan:     02/17/2022   10:47 AM 12/09/2021    1:17 PM 09/09/2021    9:23 AM 09/09/2021    9:18 AM 05/20/2021    9:44 AM  Depression screen PHQ 2/9  Decreased Interest 1 0 3  2  Down, Depressed, Hopeless 3 2 3 3 2   PHQ - 2 Score 4 2 6 3 4   Altered sleeping 3 3 3  1   Tired, decreased energy 3 3 3  2   Change in appetite 1 2 2  1   Feeling bad or failure about yourself  3 2 3  1   Trouble concentrating 3 0 3  0  Moving slowly or fidgety/restless 0 0 0  0  Suicidal thoughts 0 0 3  0  PHQ-9 Score  17 12 23  9   Difficult doing work/chores Extremely dIfficult Very difficult Extremely dIfficult  Very difficult    Flowsheet Row Video Visit from 02/17/2022 in [facility] Video Visit from 12[facility]09/2021 in [facility] Video Visit from 09/09/2021 in [facility]  C-SSRS RISK CATEGORY No Risk No Risk Error: Q3, 4, or 5 should not be populated when Q2 is No        The risk of un-intended pregnancy is low based on the fact that pt reports she on HRT. Pt is aware that these meds carry a teratogenic risk. Pt will discuss plan of action if she does or plans to become pregnant in the future.  Status of current problems: ongoing depression, anxiety and binging episodes.  Meds: Monica Jensen is endorsing excessive sweating. She is going to talk to her gynecologist. If no improvement I may consider d/c Wellbutrin.   1. Severe episode of recurrent major depressive disorder, without psychotic features (HCC) - buPROPion (WELLBUTRIN XL) 150 MG 24 hr tablet; TAKE 1 TABLET BY MOUTH( 150 MG) EVERY MORNING  Dispense: 90 tablet; Refill: 0 - VIIBRYD 40 MG TABS; TAKE 1 TABLET(40 MG) BY MOUTH DAILY  Dispense: 90 tablet; Refill: 0  2. Nicotine use disorder - buPROPion (WELLBUTRIN XL) 150 MG 24 hr tablet; TAKE 1 TABLET BY MOUTH( 150 MG) EVERY MORNING  Dispense: 90 tablet; Refill: 0  3. Bulimia nervosa - dexmethylphenidate (FOCALIN XR) 10 MG 24 hr capsule; Take 2 capsules (20 mg total) by mouth daily.  Dispense: 60 capsule; Refill: 0 - dexmethylphenidate (FOCALIN XR) 10 MG 24 hr capsule; Take 2 capsules (20 mg total) by mouth daily.  Dispense: 60 capsule; Refill: 0  4. Panic attack as reaction to stress - propranolol (INDERAL) 10 MG tablet; Take 1 tablet (10 mg total) by mouth 2 (two) times daily.  Dispense: 180 tablet; Refill: 0  5. GAD (generalized anxiety disorder) - propranolol (INDERAL) 10 MG tablet;  Take 1 tablet (10 mg total) by mouth 2 (two) times daily.  Dispense: 180 tablet; Refill: 0     Labs: none    Therapy: brief supportive therapy provided. Discussed psychosocial stressors in detail.      Collaboration of Care: Other encouraged to continue therapy  Patient/Guardian was advised Release of Information must be obtained prior to any record release in order to collaborate their care with an outside provider. Patient/Guardian was advised if they have not already done so to contact the registration department to sign all necessary forms in order for to release information regarding their care.   Consent: Patient/Guardian gives verbal consent for treatment and assignment of benefits for services provided during  this visit. Patient/Guardian expressed understanding and agreed to proceed.    Follow Up Instructions: Follow up in about 2 months or sooner if needed    I discussed the assessment and treatment plan with the patient. The patient was provided an opportunity to ask questions and all were answered. The patient agreed with the plan and demonstrated an understanding of the instructions.   The patient was advised to call back or seek an in-person evaluation if the symptoms worsen or if the condition fails to improve as anticipated.  I provided 24 minutes of non-face-to-face time during this encounter.   Oletta Darter, MD

## 2022-02-17 NOTE — Telephone Encounter (Signed)
Mediction management -Prior Authorization for patient's dexmethylphenidate (Focalin XR) 10 mg 24 hr capsules, 2 a day submitted online with CoverMyMeds and prior authorization pending a decision.

## 2022-02-18 ENCOUNTER — Telehealth (HOSPITAL_COMMUNITY): Payer: Self-pay | Admitting: *Deleted

## 2022-02-18 ENCOUNTER — Emergency Department (HOSPITAL_BASED_OUTPATIENT_CLINIC_OR_DEPARTMENT_OTHER): Payer: Commercial Managed Care - HMO | Admitting: Radiology

## 2022-02-18 ENCOUNTER — Encounter (HOSPITAL_COMMUNITY): Payer: Self-pay

## 2022-02-18 ENCOUNTER — Observation Stay (HOSPITAL_BASED_OUTPATIENT_CLINIC_OR_DEPARTMENT_OTHER)
Admission: EM | Admit: 2022-02-18 | Discharge: 2022-02-20 | Disposition: A | Discharge status: Home / Self Care | Payer: Commercial Managed Care - HMO | Attending: Internal Medicine | Admitting: Internal Medicine

## 2022-02-18 ENCOUNTER — Other Ambulatory Visit: Payer: Self-pay

## 2022-02-18 ENCOUNTER — Encounter (HOSPITAL_BASED_OUTPATIENT_CLINIC_OR_DEPARTMENT_OTHER): Payer: Self-pay

## 2022-02-18 DIAGNOSIS — Z20822 Contact with and (suspected) exposure to covid-19: Secondary | ICD-10-CM | POA: Diagnosis not present

## 2022-02-18 DIAGNOSIS — E039 Hypothyroidism, unspecified: Secondary | ICD-10-CM | POA: Insufficient documentation

## 2022-02-18 DIAGNOSIS — N179 Acute kidney failure, unspecified: Secondary | ICD-10-CM

## 2022-02-18 DIAGNOSIS — F17210 Nicotine dependence, cigarettes, uncomplicated: Secondary | ICD-10-CM | POA: Insufficient documentation

## 2022-02-18 DIAGNOSIS — J449 Chronic obstructive pulmonary disease, unspecified: Secondary | ICD-10-CM | POA: Diagnosis not present

## 2022-02-18 DIAGNOSIS — E860 Dehydration: Secondary | ICD-10-CM | POA: Insufficient documentation

## 2022-02-18 DIAGNOSIS — Z79899 Other long term (current) drug therapy: Secondary | ICD-10-CM | POA: Insufficient documentation

## 2022-02-18 DIAGNOSIS — E875 Hyperkalemia: Secondary | ICD-10-CM | POA: Diagnosis not present

## 2022-02-18 DIAGNOSIS — Z8673 Personal history of transient ischemic attack (TIA), and cerebral infarction without residual deficits: Secondary | ICD-10-CM | POA: Insufficient documentation

## 2022-02-18 DIAGNOSIS — R0602 Shortness of breath: Secondary | ICD-10-CM | POA: Diagnosis present

## 2022-02-18 LAB — CBC
HCT: 46.2 % — ABNORMAL HIGH (ref 36.0–46.0)
Hemoglobin: 16.2 g/dL — ABNORMAL HIGH (ref 12.0–15.0)
MCH: 31 pg (ref 26.0–34.0)
MCHC: 35.1 g/dL (ref 30.0–36.0)
MCV: 88.3 fL (ref 80.0–100.0)
Platelets: 467 10*3/uL — ABNORMAL HIGH (ref 150–400)
RBC: 5.23 MIL/uL — ABNORMAL HIGH (ref 3.87–5.11)
RDW: 13.6 % (ref 11.5–15.5)
WBC: 15.3 10*3/uL — ABNORMAL HIGH (ref 4.0–10.5)
nRBC: 0 % (ref 0.0–0.2)

## 2022-02-18 LAB — BASIC METABOLIC PANEL
Anion gap: 18 — ABNORMAL HIGH (ref 5–15)
BUN: 20 mg/dL (ref 6–20)
CO2: 20 mmol/L — ABNORMAL LOW (ref 22–32)
Calcium: 12.3 mg/dL — ABNORMAL HIGH (ref 8.9–10.3)
Chloride: 94 mmol/L — ABNORMAL LOW (ref 98–111)
Creatinine, Ser: 2.03 mg/dL — ABNORMAL HIGH (ref 0.44–1.00)
GFR, Estimated: 29 mL/min — ABNORMAL LOW (ref >60)
Glucose, Bld: 114 mg/dL — ABNORMAL HIGH (ref 70–99)
Potassium: >7.5 mmol/L (ref 3.5–5.1)
Sodium: 132 mmol/L — ABNORMAL LOW (ref 135–145)

## 2022-02-18 LAB — RESP PANEL BY RT-PCR (FLU A&B, COVID) ARPGX2
Influenza A by PCR: NEGATIVE
Influenza B by PCR: NEGATIVE
SARS Coronavirus 2 by RT PCR: NEGATIVE

## 2022-02-18 MED ORDER — CALCIUM GLUCONATE-NACL 1-0.675 GM/50ML-% IV SOLN
1.0000 g | Freq: Once | INTRAVENOUS | Status: AC
Start: 2022-02-18 — End: 2022-02-19

## 2022-02-18 MED ORDER — CALCIUM GLUCONATE-NACL 1-0.675 GM/50ML-% IV SOLN
INTRAVENOUS | Status: AC
  Administered 2022-02-18: 1000 mg via INTRAVENOUS
  Filled 2022-02-18: qty 50

## 2022-02-18 MED ORDER — DEXTROSE 50 % IV SOLN
1.0000 | Freq: Once | INTRAVENOUS | Status: AC
  Administered 2022-02-19: 50 mL via INTRAVENOUS
  Filled 2022-02-18: qty 50

## 2022-02-18 MED ORDER — ALBUTEROL SULFATE HFA 108 (90 BASE) MCG/ACT IN AERS
INHALATION_SPRAY | RESPIRATORY_TRACT | Status: AC
  Administered 2022-02-18: 2 via RESPIRATORY_TRACT
  Filled 2022-02-18: qty 6.7

## 2022-02-18 MED ORDER — ONDANSETRON HCL 4 MG/2ML IJ SOLN
4.0000 mg | Freq: Once | INTRAMUSCULAR | Status: AC
  Administered 2022-02-19: 4 mg via INTRAVENOUS
  Filled 2022-02-18: qty 2

## 2022-02-18 MED ORDER — INSULIN ASPART 100 UNIT/ML IJ SOLN
10.0000 [IU] | Freq: Once | INTRAMUSCULAR | Status: AC
  Administered 2022-02-19: 10 [IU] via INTRAVENOUS

## 2022-02-18 MED ORDER — ALBUTEROL SULFATE HFA 108 (90 BASE) MCG/ACT IN AERS: 2.0000 | INHALATION_SPRAY | RESPIRATORY_TRACT | Status: DC | PRN

## 2022-02-18 MED ORDER — SODIUM ZIRCONIUM CYCLOSILICATE 10 G PO PACK
10.0000 g | PACK | Freq: Once | ORAL | Status: AC
  Administered 2022-02-19: 10 g via ORAL
  Filled 2022-02-18: qty 1

## 2022-02-18 MED ORDER — SODIUM CHLORIDE 0.9 % IV BOLUS
1000.0000 mL | Freq: Once | INTRAVENOUS | Status: AC
  Administered 2022-02-18: 1000 mL via INTRAVENOUS

## 2022-02-18 MED ORDER — AEROCHAMBER PLUS FLO-VU MISC
1.0000 | Freq: Once | Status: AC
  Administered 2022-02-18: 1
  Filled 2022-02-18: qty 1

## 2022-02-18 MED ORDER — SODIUM CHLORIDE 0.9 % IV SOLN: 1.0000 g | Freq: Once | INTRAVENOUS | Status: DC

## 2022-02-18 NOTE — ED Provider Notes (Signed)
[facility] EMERGENCY DEPT  Provider Note  CSN: [id number] Arrival date & time: 02/18/22 2127  History Chief Complaint  Patient presents with   Shortness of Breath    Monica Jensen is a 56 y.o. female presents for evaluation of general weakness, rapid breathing and nausea. She reports she has not felt well all summer starting with an episode where she passed out at the pool about 7-8 weeks ago while sitting in the sun with some friends. EMS was called and she was given some fluids but declined transport to the ED. About 5 weeks ago she pulled a tick off of her R scalp. She did not have a rash or fever but reported her symptoms to her PCP who called her in a prescription for doxycycline x 28 days which she finished a week ago. She has not been feeling any better prompting her to come to the ED. She has felt like she's been breathing fast but not coughing and no DOE. She reports she has had some nausea but today was the first time she was vomiting. No decreased PO intake, normal BM and urination.   Home Medications Prior to Admission medications   Medication Sig Start Date End Date Taking? Authorizing Provider  buPROPion (WELLBUTRIN XL) 150 MG 24 hr tablet TAKE 1 TABLET BY MOUTH( 150 MG) EVERY MORNING 02/17/22   Oletta Darter, MD  dexmethylphenidate (FOCALIN XR) 10 MG 24 hr capsule Take 2 capsules (20 mg total) by mouth daily. 02/17/22   Oletta Darter, MD  dexmethylphenidate (FOCALIN XR) 10 MG 24 hr capsule Take 2 capsules (20 mg total) by mouth daily. 02/17/22   Oletta Darter, MD  levothyroxine (SYNTHROID) 88 MCG tablet Take 88 mcg by mouth daily before breakfast.    [provider]  norethindrone-ethinyl estradiol (FEMHRT 1/5) 1-5 MG-MCG TABS tablet Take 1 tablet by mouth every evening.    [provider]  pantoprazole (PROTONIX) 40 MG tablet Take 40 mg by mouth every evening.     [provider]  prazosin (MINIPRESS) 1 MG capsule Take 1 capsule  (1 mg total) by mouth at bedtime. Patient not taking: Reported on 02/17/2022 12/09/21   Oletta Darter, MD  propranolol (INDERAL) 10 MG tablet Take 1 tablet (10 mg total) by mouth 2 (two) times daily. 02/17/22   Oletta Darter, MD  VIIBRYD 40 MG TABS TAKE 1 TABLET(40 MG) BY MOUTH DAILY 02/17/22   Oletta Darter, MD     Allergies    Penicillins, Cheese, Chocolate, Lunesta [eszopiclone], Other, Prednisone, and Epinephrine   Review of Systems   Review of Systems Please see HPI for pertinent positives and negatives  Physical Exam BP 103/77   Pulse 71   Temp 98.5 F (36.9 C) (Oral)   Resp 14   Ht 5' 3.5" (1.613 m)   Wt 47.6 kg   LMP 06/03/2014   SpO2 95%   BMI 18.30 kg/m   Physical Exam Vitals and nursing note reviewed.  Constitutional:      Appearance: Normal appearance.  HENT:     Head: Normocephalic and atraumatic.     Nose: Nose normal.     Mouth/Throat:     Mouth: Mucous membranes are moist.  Eyes:     Extraocular Movements: Extraocular movements intact.     Conjunctiva/sclera: Conjunctivae normal.  Cardiovascular:     Rate and Rhythm: Normal rate.  Pulmonary:     Effort: Pulmonary effort is normal. Tachypnea present.     Breath sounds: Normal breath  sounds. No wheezing or rales.  Abdominal:     General: Abdomen is flat.     Palpations: Abdomen is soft.     Tenderness: There is no abdominal tenderness.  Musculoskeletal:        General: No swelling. Normal range of motion.     Cervical back: Neck supple.  Skin:    General: Skin is warm and dry.  Neurological:     General: No focal deficit present.     Mental Status: She is alert and oriented to person, place, and time.     Cranial Nerves: No cranial nerve deficit.     Motor: No weakness.  Psychiatric:        Mood and Affect: Mood normal.     ED Results / Procedures / Treatments   EKG EKG Interpretation  Date/Time:  Friday February 18 2022 21:43:51 EDT Ventricular Rate:  92 PR Interval:  148 QRS  Duration: 84 QT Interval:  358 QTC Calculation: 442 R Axis:   79 Text Interpretation: Normal sinus rhythm Biatrial enlargement Abnormal ECG No previous ECGs available Confirmed by Susy Frizzle [phone]) on 02/18/2022 10:59:01 PM  Procedures .Critical Care  Performed by: Pollyann Savoy, MD Authorized by: Pollyann Savoy, MD   Critical care provider statement:    Critical care time (minutes):  35   Critical care time was exclusive of:  Separately billable procedures and treating other patients   Critical care was necessary to treat or prevent imminent or life-threatening deterioration of the following conditions:  Renal failure and metabolic crisis   Critical care was time spent personally by me on the following activities:  Development of treatment plan with patient or surrogate, discussions with consultants, evaluation of patient's response to treatment, examination of patient, ordering and review of laboratory studies, ordering and review of radiographic studies, ordering and performing treatments and interventions, pulse oximetry, re-evaluation of patient's condition and review of old charts   Medications Ordered in the ED Medications  albuterol (VENTOLIN HFA) 108 (90 Base) MCG/ACT inhaler 2 puff (2 puffs Inhalation Given 02/18/22 2345)  0.9 %  sodium chloride infusion ( Intravenous New Bag/Given 02/19/22 0103)  sodium chloride 0.9 % bolus 1,000 mL (0 mLs Intravenous Stopped 02/19/22 0102)  sodium zirconium cyclosilicate (LOKELMA) packet 10 g (10 g Oral Given 02/19/22 0024)  insulin aspart (novoLOG) injection 10 Units (10 Units Intravenous Given 02/19/22 0020)  dextrose 50 % solution 50 mL (50 mLs Intravenous Given 02/19/22 0021)  aerochamber plus with mask device 1 each (1 each Other Given 02/18/22 2345)  calcium gluconate 1 g/ 50 mL sodium chloride IVPB (0 mg Intravenous Stopped 02/19/22 0055)  ondansetron (ZOFRAN) injection 4 mg (4 mg Intravenous Given 02/19/22 0000)  dextrose 50 %  solution 50 mL (50 mLs Intravenous Given 02/19/22 0113)    Initial Impression and Plan  Patient here with generalized weakness, worsening over the last 2 months or so since a reported episode of heat exhaustion/stroke. She had labs done in triage showing CBC with leukocytosis and elevated HGB/PLT, could be due to hemoconcentration. BMP shows marked hyperkalemia, anion gap acidosis and AKI. Last labs available here were about 9 years ago but she reports labs done at [facility] in January were normal. She does not have EKG signs of hyperkalemia, will begin lokelma, CaGluc, insulin/D50 and fluid resuscitation. Plan admission for further management.   ED Course   Clinical Course as of 02/19/22 0418  Fri Feb 18, 2022  2321 I personally viewed the images  from radiology studies and agree with radiologist interpretation: CXR is clear.   [CS]  Sat Feb 19, 2022  0011 Spoke with Dr. Marylu Lund, Hospitalist, who will accept for admission.  [CS]  0112 Glucose is low after insulin/D50, will give an additional amp of glucose.  [CS]  0130 Repeat BMP with improved K and Cr.  [CS]    Clinical Course User Index [CS] Pollyann Savoy, MD     MDM Rules/Calculators/A&P Medical Decision Making Problems Addressed: AKI (acute kidney injury) [facility]): acute illness or injury Hyperkalemia: acute illness or injury  Amount and/or Complexity of Data Reviewed Labs: ordered. Decision-making details documented in ED Course. Radiology: ordered and independent interpretation performed. Decision-making details documented in ED Course. ECG/medicine tests: ordered and independent interpretation performed. Decision-making details documented in ED Course.  Risk Prescription drug management. Decision regarding hospitalization.    Final Clinical Impression(s) / ED Diagnoses Final diagnoses:  Hyperkalemia  AKI (acute kidney injury) [facility])    Rx / DC Orders ED Discharge Orders     None        Pollyann Savoy,  MD 02/19/22 [phone]

## 2022-02-18 NOTE — Telephone Encounter (Signed)
PA FOR DEXMETHYLPHENIDATE HCL ER 10 MG APPROVED BY [employer].   APPROVAL VALID FORM 02/16/22 THROUGH 02/15/23.  COVER MY MEDS KEY: [id number]

## 2022-02-18 NOTE — ED Notes (Signed)
RT educated pt on proper use of MDI w/spacer. Pt respiratory status stable w/no distress noted at this time. Pt verbalizes understanding of administration instructions. Discussed smoking cessation at this time as well. Pt states she rarely smokes anymore.

## 2022-02-18 NOTE — ED Triage Notes (Signed)
Patient here POV from Home.  Endorses Heat Stroke approximately 1 Month for which she was assessed by EMS for. Tick was also found and removed 3 Weeks ago. Treated with Antibiotics.  More recently the Patient endorses Moderate Nausea, Emesis, SOB, Weakness.  No Diarrhea. No Fevers.  NAD Noted during Triage. A&Ox4. GCS 15. BIB Wheelchair.

## 2022-02-19 ENCOUNTER — Observation Stay (HOSPITAL_COMMUNITY): Payer: Commercial Managed Care - HMO

## 2022-02-19 DIAGNOSIS — J449 Chronic obstructive pulmonary disease, unspecified: Secondary | ICD-10-CM | POA: Diagnosis not present

## 2022-02-19 DIAGNOSIS — E875 Hyperkalemia: Secondary | ICD-10-CM | POA: Diagnosis present

## 2022-02-19 DIAGNOSIS — F17210 Nicotine dependence, cigarettes, uncomplicated: Secondary | ICD-10-CM | POA: Diagnosis not present

## 2022-02-19 DIAGNOSIS — N179 Acute kidney failure, unspecified: Secondary | ICD-10-CM | POA: Diagnosis not present

## 2022-02-19 DIAGNOSIS — Z20822 Contact with and (suspected) exposure to covid-19: Secondary | ICD-10-CM | POA: Diagnosis not present

## 2022-02-19 DIAGNOSIS — Z8673 Personal history of transient ischemic attack (TIA), and cerebral infarction without residual deficits: Secondary | ICD-10-CM | POA: Diagnosis not present

## 2022-02-19 DIAGNOSIS — E039 Hypothyroidism, unspecified: Secondary | ICD-10-CM | POA: Diagnosis not present

## 2022-02-19 DIAGNOSIS — R0602 Shortness of breath: Secondary | ICD-10-CM | POA: Diagnosis present

## 2022-02-19 DIAGNOSIS — E860 Dehydration: Secondary | ICD-10-CM | POA: Diagnosis not present

## 2022-02-19 DIAGNOSIS — Z79899 Other long term (current) drug therapy: Secondary | ICD-10-CM | POA: Diagnosis not present

## 2022-02-19 LAB — BASIC METABOLIC PANEL
Anion gap: 11 (ref 5–15)
Anion gap: 7 (ref 5–15)
BUN: 22 mg/dL — ABNORMAL HIGH (ref 6–20)
BUN: 15 mg/dL (ref 6–20)
CO2: 21 mmol/L — ABNORMAL LOW (ref 22–32)
CO2: 25 mmol/L (ref 22–32)
Calcium: 10.2 mg/dL (ref 8.9–10.3)
Calcium: 9.5 mg/dL (ref 8.9–10.3)
Chloride: 101 mmol/L (ref 98–111)
Chloride: 104 mmol/L (ref 98–111)
Creatinine, Ser: 1.76 mg/dL — ABNORMAL HIGH (ref 0.44–1.00)
Creatinine, Ser: 0.97 mg/dL (ref 0.44–1.00)
GFR, Estimated: 34 mL/min — ABNORMAL LOW (ref >60)
GFR, Estimated: >60 mL/min (ref >60)
Glucose, Bld: 256 mg/dL — ABNORMAL HIGH (ref 70–99)
Glucose, Bld: 88 mg/dL (ref 70–99)
Potassium: 5.2 mmol/L — ABNORMAL HIGH (ref 3.5–5.1)
Potassium: 3.8 mmol/L (ref 3.5–5.1)
Sodium: 133 mmol/L — ABNORMAL LOW (ref 135–145)
Sodium: 136 mmol/L (ref 135–145)

## 2022-02-19 LAB — URINALYSIS, ROUTINE W REFLEX MICROSCOPIC
Bilirubin Urine: NEGATIVE
Bilirubin Urine: NEGATIVE
Glucose, UA: 250 mg/dL — AB
Glucose, UA: NEGATIVE mg/dL
Hgb urine dipstick: NEGATIVE
Hgb urine dipstick: NEGATIVE
Ketones, ur: NEGATIVE mg/dL
Ketones, ur: NEGATIVE mg/dL
Leukocytes,Ua: NEGATIVE
Leukocytes,Ua: NEGATIVE
Nitrite: NEGATIVE
Nitrite: NEGATIVE
Protein, ur: NEGATIVE mg/dL
Protein, ur: NEGATIVE mg/dL
Specific Gravity, Urine: 1.01 (ref 1.005–1.030)
Specific Gravity, Urine: 1.009 (ref 1.005–1.030)
pH: 7 (ref 5.0–8.0)
pH: 6 (ref 5.0–8.0)

## 2022-02-19 LAB — CBG MONITORING, ED
Glucose-Capillary: 40 mg/dL — CL (ref 70–99)
Glucose-Capillary: 41 mg/dL — CL (ref 70–99)
Glucose-Capillary: 82 mg/dL (ref 70–99)
Glucose-Capillary: 89 mg/dL (ref 70–99)
Glucose-Capillary: 115 mg/dL — ABNORMAL HIGH (ref 70–99)
Glucose-Capillary: 116 mg/dL — ABNORMAL HIGH (ref 70–99)

## 2022-02-19 LAB — HEMOGLOBIN A1C
Hgb A1c MFr Bld: 4.9 % (ref 4.8–5.6)
Mean Plasma Glucose: 93.93 mg/dL

## 2022-02-19 LAB — CK: Total CK: 82 U/L (ref 38–234)

## 2022-02-19 LAB — HIV ANTIBODY (ROUTINE TESTING W REFLEX): HIV Screen 4th Generation wRfx: NONREACTIVE

## 2022-02-19 MED ORDER — DEXMETHYLPHENIDATE HCL ER 5 MG PO CP24: 20.0000 mg | ORAL_CAPSULE | Freq: Every day | ORAL | Status: DC

## 2022-02-19 MED ORDER — LORAZEPAM 2 MG/ML IJ SOLN: 1.0000 mg | INTRAMUSCULAR | Status: DC | PRN

## 2022-02-19 MED ORDER — LORAZEPAM 1 MG PO TABS: 1.0000 mg | ORAL_TABLET | ORAL | Status: DC | PRN

## 2022-02-19 MED ORDER — THIAMINE HCL 100 MG/ML IJ SOLN: 100.0000 mg | Freq: Every day | INTRAMUSCULAR | Status: DC

## 2022-02-19 MED ORDER — DEXTROSE 50 % IV SOLN
1.0000 | Freq: Once | INTRAVENOUS | Status: AC
  Administered 2022-02-19: 50 mL via INTRAVENOUS
  Filled 2022-02-19: qty 50

## 2022-02-19 MED ORDER — PROPRANOLOL HCL 10 MG PO TABS
10.0000 mg | ORAL_TABLET | Freq: Two times a day (BID) | ORAL | Status: DC
  Administered 2022-02-19 – 2022-02-20 (×2): 10 mg via ORAL
  Filled 2022-02-19 (×4): qty 1

## 2022-02-19 MED ORDER — FOLIC ACID 1 MG PO TABS
1.0000 mg | ORAL_TABLET | Freq: Every day | ORAL | Status: DC
  Administered 2022-02-19 – 2022-02-20 (×2): 1 mg via ORAL
  Filled 2022-02-19 (×2): qty 1

## 2022-02-19 MED ORDER — NORETHINDRONE-ETH ESTRADIOL 1-5 MG-MCG PO TABS
1.0000 | ORAL_TABLET | Freq: Every evening | ORAL | Status: DC
Start: 2022-02-19 — End: 2022-02-19

## 2022-02-19 MED ORDER — SODIUM CHLORIDE 0.9 % IV SOLN: INTRAVENOUS | Status: DC

## 2022-02-19 MED ORDER — LEVOTHYROXINE SODIUM 88 MCG PO TABS
88.0000 ug | ORAL_TABLET | Freq: Every day | ORAL | Status: DC
  Administered 2022-02-20: 88 ug via ORAL
  Filled 2022-02-19: qty 1

## 2022-02-19 MED ORDER — BUPROPION HCL ER (XL) 150 MG PO TB24
150.0000 mg | ORAL_TABLET | Freq: Every day | ORAL | Status: DC
  Administered 2022-02-20: 150 mg via ORAL
  Filled 2022-02-19: qty 1

## 2022-02-19 MED ORDER — ADULT MULTIVITAMIN W/MINERALS CH
1.0000 | ORAL_TABLET | Freq: Every day | ORAL | Status: DC
  Administered 2022-02-19 – 2022-02-20 (×2): 1 via ORAL
  Filled 2022-02-19 (×2): qty 1

## 2022-02-19 MED ORDER — THIAMINE HCL 100 MG PO TABS
100.0000 mg | ORAL_TABLET | Freq: Every day | ORAL | Status: DC
  Administered 2022-02-20: 100 mg via ORAL
  Filled 2022-02-19 (×2): qty 1

## 2022-02-19 MED ORDER — VILAZODONE HCL 40 MG PO TABS
40.0000 mg | ORAL_TABLET | Freq: Every day | ORAL | Status: DC
  Filled 2022-02-19: qty 1

## 2022-02-19 MED ORDER — BUTALBITAL-APAP-CAFFEINE 50-325-40 MG PO TABS
1.0000 | ORAL_TABLET | Freq: Four times a day (QID) | ORAL | Status: DC | PRN
Start: 2022-02-19 — End: 2022-02-20

## 2022-02-19 MED ORDER — PANTOPRAZOLE SODIUM 40 MG PO TBEC
40.0000 mg | DELAYED_RELEASE_TABLET | Freq: Every evening | ORAL | Status: DC
  Administered 2022-02-19: 40 mg via ORAL
  Filled 2022-02-19: qty 1

## 2022-02-19 NOTE — ED Notes (Signed)
RT Note: Pt. seen for follow up on Respiratory status from previous shift, remains awaiting [facility] for admission once bed becomes available, in no distress, v/s WNL with no Wheeze auscultated b/l upon re-assessment from earlier this date or stated shortness of breath, RT to monitor.

## 2022-02-19 NOTE — Plan of Care (Signed)
  Problem: Education: Goal: Knowledge of General Education information will improve Description Including pain rating scale, medication(s)/side effects and non-pharmacologic comfort measures Outcome: Progressing   Problem: Health Behavior/Discharge Planning: Goal: Ability to manage health-related needs will improve Outcome: Progressing   

## 2022-02-19 NOTE — H&P (Signed)
History and Physical    Monica Jensen:[id number] DOB: 1966-02-17 DOA: 02/18/2022  PCP: Marden Noble, MD (Confirm with patient/family/NH records and if not entered, this has to be entered at [facility] point of entry) Patient coming from: Home  I have personally briefly reviewed patient's old medical records in [facility] Link  Chief Complaint: Feeling tired  HPI: Monica Jensen is a 56 y.o. female with medical history significant of anxiety/depression disorder, bulimia nervosa, panic attacks, came to ED last night for worsening of generalized weakness, persistent nausea vomiting and dehydration.  Patient had a tick bite about 1 month ago, and she became very nervous since then.  She did receive a total of 21 days of doxycycline treatment which and completed 1 week ago.  However, for the last week, patient has experienced frequent episodes of feeling nausea, occasional vomiting stomach content nonbilious nonbloody.  No abdominal pain no fever chills no diarrhea.  Has been drinking " electrolyte water" but still feels " dehydrated".  About 1 month ago she had episode of syncope and collapse, EMS was called and EMS found the patient was hypotensive.  Fluid was given and her symptoms improved, she could not remember any of the blood work-up was done at that point.  But as her request, she did not go to hospital at that time.  She denies any decreased urine output or urine color changes no dysuria no back pain.  ED Course: Vital signs stable, no tachycardia, no hypotension.  Blood work kidney function creatinine 2.0, K7.5, bicarb 20, sodium 132.  Patient was given hyperkalemia cocktail including dextrose and insulin bicarb and Lokelma.  K level came down to 5.2 in 3 hours, kidney function improved to 1.7.  Review of Systems: As per HPI otherwise 14 point review of systems negative.    Past Medical History:  Diagnosis Date   Anemia 15 YRS AGO, NONE RECENT   Arthritis    LOWER BACK AND HIPS    Broken foot    BOTH FEET LEFT FOOT CAST   Complication of anesthesia    Epinephrine"tachycardia"-dentist   Concussion with brief LOC    COPD (chronic obstructive pulmonary disease) (HCC)    MILD   Depression    GERD (gastroesophageal reflux disease)    Headache(784.0)    migraines   Hip pain    History of hiatal hernia    History of kidney stones    passed   Hypersomnolence disorder, subacute 09/09/2013   Sleepiness has increased over the last 6 month, she has been using Ambien for 20 years. Fears memory loss. Questions  About  Recent memory, loss related to [place].    Hypothyroidism    Memory loss HX OF WITH CONCUSSION   Menopause    Ovarian cyst    TIA (transient ischemic attack)    SUSPECTED ABOUT 11 YEARS AGO    Past Surgical History:  Procedure Laterality Date   ABDOMINAL SURGERY     x2(laparoscopic, 1 open), NO FALLOPIAN TUBES REMOVED WITH SURGERY   APPENDECTOMY     CESAREAN SECTION     X 1   COLON SURGERY  1986   "blockage"colectomy   COLONOSCOPY WITH PROPOFOL N/A 10/01/2013   Procedure: COLONOSCOPY WITH PROPOFOL;  Surgeon: Charolett Bumpers, MD;  Location: [facility];  Service: Endoscopy;  Laterality: N/A;   ESOPHAGOGASTRODUODENOSCOPY (EGD) WITH PROPOFOL N/A 09/26/2016   Procedure: ESOPHAGOGASTRODUODENOSCOPY (EGD) WITH PROPOFOL;  Surgeon: Charolett Bumpers, MD;  Location: [facility];  Service: Endoscopy;  Laterality: N/A;   HEMORROIDECTOMY     HEMORROIDECTOMY     4x- last in Dec 2016   [place]  December 19, 2013   implant   ORIF TOE FRACTURE Right 06/19/2014   Procedure: RIGHT FOOT OPEN REDUCTION INTERNAL FIXATION (ORIF) FIFTH METATARSAL ;  Surgeon: Ninetta Lights, MD;  Location: [place];  Service: Orthopedics;  Laterality: Right;     reports that she has been smoking cigarettes. She has a 6.60 pack-year smoking history. She has never used smokeless tobacco. She reports current alcohol use of about 2.0 standard drinks of alcohol per week. She  reports that she does not use drugs.  Allergies  Allergen Reactions   Penicillins Anaphylaxis    Has patient had a PCN reaction causing immediate rash, facial/tongue/throat swelling, SOB or lightheadedness with hypotension: Yes Has patient had a PCN reaction causing severe rash involving mucus membranes or skin necrosis: No Has patient had a PCN reaction that required hospitalization: No Has patient had a PCN reaction occurring within the last 10 years: No If all of the above answers are "NO", then may proceed with Cephalosporin use.    Cheese Other (See Comments)    Headache   Chocolate Other (See Comments)    Headache    Lunesta [Eszopiclone]     Had an episode of memory loss after taking it   Other Other (See Comments)    MSG-Headache   Prednisone Swelling    NAUSEA, STOMACH ISSUES   Epinephrine Palpitations    Heart racing    Family History  Problem Relation Age of Onset   Heart disease Maternal Grandfather    Heart disease Maternal Grandmother    Lung cancer Maternal Grandmother    Anxiety disorder Sister    Depression Sister    Alcohol abuse Sister    Anxiety disorder Cousin    Alcohol abuse Cousin    Depression Cousin        committed suicide   Suicidality Cousin    Cancer Other    Hypertension Other    Stroke Other      Prior to Admission medications   Medication Sig Start Date End Date Taking? Authorizing Provider  buPROPion (WELLBUTRIN XL) 150 MG 24 hr tablet TAKE 1 TABLET BY MOUTH( 150 MG) EVERY MORNING 02/17/22   Charlcie Cradle, MD  dexmethylphenidate (FOCALIN XR) 10 MG 24 hr capsule Take 2 capsules (20 mg total) by mouth daily. 02/17/22   Charlcie Cradle, MD  dexmethylphenidate (FOCALIN XR) 10 MG 24 hr capsule Take 2 capsules (20 mg total) by mouth daily. 02/17/22   Charlcie Cradle, MD  levothyroxine (SYNTHROID) 88 MCG tablet Take 88 mcg by mouth daily before breakfast.    [provider]  norethindrone-ethinyl estradiol (FEMHRT 1/5) 1-5 MG-MCG  TABS tablet Take 1 tablet by mouth every evening.    [provider]  pantoprazole (PROTONIX) 40 MG tablet Take 40 mg by mouth every evening.     [provider]  prazosin (MINIPRESS) 1 MG capsule Take 1 capsule (1 mg total) by mouth at bedtime. Patient not taking: Reported on 02/17/2022 12/09/21   Charlcie Cradle, MD  propranolol (INDERAL) 10 MG tablet Take 1 tablet (10 mg total) by mouth 2 (two) times daily. 02/17/22   Charlcie Cradle, MD  VIIBRYD 40 MG TABS TAKE 1 TABLET(40 MG) BY MOUTH DAILY 02/17/22   Charlcie Cradle, MD    Physical Exam: Vitals:   02/19/22 1030 02/19/22 1100 02/19/22 1505 02/19/22 1656  BP: 110/80 95/81 114/87 (!) 118/95  Pulse: 63 74 74 61  Resp: 19 18 (!) 21 18  Temp:  98.7 F (37.1 C) 98.1 F (36.7 C)   TempSrc:   Oral   SpO2: 97% 99% 100% 100%  Weight:      Height:        Constitutional: NAD, calm, comfortable Vitals:   02/19/22 1030 02/19/22 1100 02/19/22 1505 02/19/22 1656  BP: 110/80 95/81 114/87 (!) 118/95  Pulse: 63 74 74 61  Resp: 19 18 (!) 21 18  Temp:  98.7 F (37.1 C) 98.1 F (36.7 C)   TempSrc:   Oral   SpO2: 97% 99% 100% 100%  Weight:      Height:       Eyes: PERRL, lids and conjunctivae normal ENMT: Mucous membranes are moist. Posterior pharynx clear of any exudate or lesions.Normal dentition.  Neck: normal, supple, no masses, no thyromegaly Respiratory: clear to auscultation bilaterally, no wheezing, no crackles. Normal respiratory effort. No accessory muscle use.  Cardiovascular: Regular rate and rhythm, no murmurs / rubs / gallops. No extremity edema. 2+ pedal pulses. No carotid bruits.  Abdomen: no tenderness, no masses palpated. No hepatosplenomegaly. Bowel sounds positive.  Musculoskeletal: no clubbing / cyanosis. No joint deformity upper and lower extremities. Good ROM, no contractures. Normal muscle tone.  Skin: no rashes, lesions, ulcers. No induration Neurologic: CN 2-12 grossly intact. Sensation intact, DTR  normal. Strength 5/5 in all 4.  Psychiatric: Normal judgment and insight. Alert and oriented x 3. Normal mood.    Labs on Admission: I have personally reviewed following labs and imaging studies  CBC: Recent Labs  Lab 02/18/22 2150  WBC 15.3*  HGB 16.2*  HCT 46.2*  MCV 88.3  PLT 467*   Basic Metabolic Panel: Recent Labs  Lab 02/18/22 2150 02/19/22 0036  NA 132* 133*  K >7.5* 5.2*  CL 94* 101  CO2 20* 21*  GLUCOSE 114* 256*  BUN 20 22*  CREATININE 2.03* 1.76*  CALCIUM 12.3* 10.2   GFR: Estimated Creatinine Clearance: 27.5 mL/min (A) (by C-G formula based on SCr of 1.76 mg/dL (H)). Liver Function Tests: No results for input(s): "AST", "ALT", "ALKPHOS", "BILITOT", "PROT", "ALBUMIN" in the last 168 hours. No results for input(s): "LIPASE", "AMYLASE" in the last 168 hours. No results for input(s): "AMMONIA" in the last 168 hours. Coagulation Profile: No results for input(s): "INR", "PROTIME" in the last 168 hours. Cardiac Enzymes: No results for input(s): "CKTOTAL", "CKMB", "CKMBINDEX", "TROPONINI" in the last 168 hours. BNP (last 3 results) No results for input(s): "PROBNP" in the last 8760 hours. HbA1C: No results for input(s): "HGBA1C" in the last 72 hours. CBG: Recent Labs  Lab 02/19/22 0110 02/19/22 0200 02/19/22 0203 02/19/22 0249 02/19/22 0409  GLUCAP 40* 82 89 115* 116*   Lipid Profile: No results for input(s): "CHOL", "HDL", "LDLCALC", "TRIG", "CHOLHDL", "LDLDIRECT" in the last 72 hours. Thyroid Function Tests: No results for input(s): "TSH", "T4TOTAL", "FREET4", "T3FREE", "THYROIDAB" in the last 72 hours. Anemia Panel: No results for input(s): "VITAMINB12", "FOLATE", "FERRITIN", "TIBC", "IRON", "RETICCTPCT" in the last 72 hours. Urine analysis:    Component Value Date/Time   COLORURINE YELLOW 02/19/2022 0419   APPEARANCEUR CLEAR 02/19/2022 0419   LABSPEC 1.010 02/19/2022 0419   PHURINE 7.0 02/19/2022 0419   GLUCOSEU 250 (A) 02/19/2022 0419    HGBUR NEGATIVE 02/19/2022 0419   BILIRUBINUR NEGATIVE 02/19/2022 0419   KETONESUR NEGATIVE 02/19/2022 0419   PROTEINUR NEGATIVE 02/19/2022 0419   UROBILINOGEN  0.2 06/08/2012 2240   NITRITE NEGATIVE 02/19/2022 0419   LEUKOCYTESUR NEGATIVE 02/19/2022 0419    Radiological Exams on Admission: DG Chest 2 View  Result Date: 02/18/2022 CLINICAL DATA:  Dyspnea EXAM: CHEST - 2 VIEW COMPARISON:  Radiographs 06/16/2021 FINDINGS: No focal consolidation, pleural effusion, or pneumothorax. Normal cardiomediastinal silhouette. No acute osseous abnormality. IMPRESSION: No active cardiopulmonary disease. Electronically Signed   By: Minerva Fester M.D.   On: 02/18/2022 23:00    EKG: Independently reviewed.  Sinus with tented T wave  Assessment/Plan Principal Problem:   Hyperkalemia  (please populate well all problems here in Problem List. (For example, if patient is on BP meds at home and you resume or decide to hold them, it is a problem that needs to be her. Same for CAD, COPD, HLD and so on)  Hyperkalemia -Likely secondary to AKI and dehydration. -K= 5.2 this morning, will recheck level now.  Continue high rate of IV fluid normal saline 200 mL/h -Renal ultrasound ordered  Hypercalcemia -Likely secondary to AKI, resolved after IV hydration.  AKI versus CKD -Given the history and presentation, suspect AKI secondary to prerenal etiology/volume contraction, kidney function responded to IV fluid.  We will continue maintenance IV fluid, recheck kidney function tomorrow morning.  History of alcohol abuse -No significant symptoms signs of active withdrawal, CIWA protocol with as needed benzos  Anxiety/depression -Continue SSRI  Cigarette smoke -On Wellbutrin  DVT prophylaxis: SCD Code Status: Full code Family Communication: None at bedside Disposition Plan: MedSurg observation Consults called: None Admission status: MedSurg observation   Emeline General MD [facility] Pager  [phone]  02/19/2022, 6:11 PM

## 2022-02-19 NOTE — ED Notes (Signed)
CBG 200 

## 2022-02-19 NOTE — Progress Notes (Signed)
Pt being transferred from [facility] to [employer].  55y/o female had a tick bite about one month ago, called pcp and said wasn't feeling well, was given doxycycline treatment for 28 days. Completed abx last week. Still not feeling well. Presented tonight at [facility] and found with K >7.5. Per med given ca gluconate, ivf ordered, ekg with peaked T, insulin, D5, and lokelma being given.  Will need nephrology consult .

## 2022-02-19 NOTE — ED Notes (Signed)
RT Note: Pt. seen for respiratory status after shift change, in no distress, b/l b.s.clear with no wheeze, made aware of call bell if needed.

## 2022-02-20 DIAGNOSIS — E875 Hyperkalemia: Secondary | ICD-10-CM | POA: Diagnosis not present

## 2022-02-20 LAB — CBC
HCT: 34.5 % — ABNORMAL LOW (ref 36.0–46.0)
Hemoglobin: 11.7 g/dL — ABNORMAL LOW (ref 12.0–15.0)
MCH: 31.4 pg (ref 26.0–34.0)
MCHC: 33.9 g/dL (ref 30.0–36.0)
MCV: 92.5 fL (ref 80.0–100.0)
Platelets: 248 10*3/uL (ref 150–400)
RBC: 3.73 MIL/uL — ABNORMAL LOW (ref 3.87–5.11)
RDW: 13.8 % (ref 11.5–15.5)
WBC: 5.6 10*3/uL (ref 4.0–10.5)
nRBC: 0 % (ref 0.0–0.2)

## 2022-02-20 LAB — BASIC METABOLIC PANEL
Anion gap: 6 (ref 5–15)
BUN: 10 mg/dL (ref 6–20)
CO2: 23 mmol/L (ref 22–32)
Calcium: 8.7 mg/dL — ABNORMAL LOW (ref 8.9–10.3)
Chloride: 111 mmol/L (ref 98–111)
Creatinine, Ser: 0.79 mg/dL (ref 0.44–1.00)
GFR, Estimated: >60 mL/min (ref >60)
Glucose, Bld: 92 mg/dL (ref 70–99)
Potassium: 4 mmol/L (ref 3.5–5.1)
Sodium: 140 mmol/L (ref 135–145)

## 2022-02-20 MED ORDER — VILAZODONE HCL 20 MG PO TABS
40.0000 mg | ORAL_TABLET | Freq: Every day | ORAL | Status: DC
  Administered 2022-02-20: 40 mg via ORAL
  Filled 2022-02-20: qty 2

## 2022-02-20 MED ORDER — ADULT MULTIVITAMIN W/MINERALS CH: 1.0000 | ORAL_TABLET | Freq: Every day | ORAL | Status: AC

## 2022-02-20 MED ORDER — FOLIC ACID 1 MG PO TABS: 1.0000 mg | ORAL_TABLET | Freq: Every day | ORAL | Status: DC

## 2022-02-20 MED ORDER — DEXMETHYLPHENIDATE HCL ER 5 MG PO CP24: 20.0000 mg | ORAL_CAPSULE | Freq: Every day | ORAL | Status: DC | PRN

## 2022-02-20 MED ORDER — PROPRANOLOL HCL 10 MG PO TABS: 5.0000 mg | ORAL_TABLET | Freq: Two times a day (BID) | ORAL | 1 refills | Status: DC

## 2022-02-20 MED ORDER — THIAMINE HCL 100 MG PO TABS: 100.0000 mg | ORAL_TABLET | Freq: Every day | ORAL | Status: DC

## 2022-02-20 NOTE — Discharge Summary (Signed)
Physician Discharge Summary  Monica Jensen LNL:[id number] DOB: [birth year] DOA: 02/18/2022  PCP: Marden Noble, MD  Admit date: 02/18/2022 Discharge date: 02/20/2022  Admitted From: Home Disposition: Home  Recommendations for Outpatient Follow-up:  Follow up with PCP in 1-2 weeks Please obtain BMP/CBC in one week  Home Health: None  Equipment/Devices: None   Discharge Condition: Stable CODE STATUS full code Diet recommendation: Cardiac Brief/Interim Summary:  56 y.o. female with medical history significant of anxiety/depression disorder, bulimia nervosa, panic attacks, came to ED last night for worsening of generalized weakness, persistent nausea vomiting and dehydration.   Patient had a tick bite about 1 month ago, and she became very nervous since then.  She did receive a total of 21 days of doxycycline treatment which and completed 1 week ago.  However, for the last week, patient has experienced frequent episodes of feeling nausea, occasional vomiting stomach content nonbilious nonbloody.  No abdominal pain no fever chills no diarrhea.  Has been drinking " electrolyte water" but still feels " dehydrated".  About 1 month ago she had episode of syncope and collapse, EMS was called and EMS found the patient was hypotensive.  Fluid was given and her symptoms improved, she could not remember any of the blood work-up was done at that point.  But as her request, she did not go to hospital at that time.  She denies any decreased urine output or urine color changes no dysuria no back pain.   ED Course: Vital signs stable, no tachycardia, no hypotension.   Blood work kidney function creatinine 2.0, K7.5, bicarb 20, sodium 132.  Patient was given hyperkalemia cocktail including dextrose and insulin bicarb and Lokelma.  K level came down to 5.2 in 3 hours, kidney function improved to 1.7. Discharge Diagnoses:  Principal Problem:   Hyperkalemia  #1 hyperkalemia secondary to dehydration and excess  intake of electrolyte water.  This has been resolved with treatment with normal saline and  Insulin and glucose bicarb and Lokelma. Her K was over 7.5 on admission and on discharge her potassium was 4.0. She was able to ambulate in the hallway prior to discharge. renal ultrasound no acute findings.  #2 AKI resolved with IV fluids.  Creatinine 0.79 on discharge.  #3 hypercalcemia resolved with IV fluids calcium 8.7 on discharge.  #4 history of EtOH use advised to regarding cessation of alcohol use.  #5 anxiety continue SSRI  #6 depression continue Wellbutrin  #7 tobacco abuse on Wellbutrin  #8 leukocytosis resolved with IV fluids.   Estimated body mass index is 18.3 kg/m as calculated from the following:   Height as of this encounter: 5' 3.5" (1.613 m).   Weight as of this encounter: 47.6 kg.  Discharge Instructions  Discharge Instructions     Diet - low sodium heart healthy   Complete by: As directed    Increase activity slowly   Complete by: As directed       Allergies as of 02/20/2022       Reactions   Penicillins Anaphylaxis   Has patient had a PCN reaction causing immediate rash, facial/tongue/throat swelling, SOB or lightheadedness with hypotension: Yes Has patient had a PCN reaction causing severe rash involving mucus membranes or skin necrosis: No Has patient had a PCN reaction that required hospitalization: No Has patient had a PCN reaction occurring within the last 10 years: No If all of the above answers are "NO", then may proceed with Cephalosporin use.   Cheese Other (See Comments)  Headache   Chocolate Other (See Comments)   Headache   Lunesta [eszopiclone]    Had an episode of memory loss after taking it   Other Other (See Comments)   MSG-Headache   Prednisone Swelling   NAUSEA, STOMACH ISSUES   Epinephrine Palpitations   Heart racing        Medication List     STOP taking these medications    prazosin 1 MG capsule Commonly known as:  MINIPRESS       TAKE these medications    buPROPion 150 MG 24 hr tablet Commonly known as: WELLBUTRIN XL TAKE 1 TABLET BY MOUTH( 150 MG) EVERY MORNING   dexmethylphenidate 10 MG 24 hr capsule Commonly known as: Focalin XR Take 2 capsules (20 mg total) by mouth daily. What changed: Another medication with the same name was removed. Continue taking this medication, and follow the directions you see here.   folic acid 1 MG tablet Commonly known as: FOLVITE Take 1 tablet (1 mg total) by mouth daily.   levothyroxine 88 MCG tablet Commonly known as: SYNTHROID Take 88 mcg by mouth daily before breakfast.   multivitamin with minerals Tabs tablet Take 1 tablet by mouth daily.   norethindrone-ethinyl estradiol 1-5 MG-MCG Tabs tablet Commonly known as: FEMHRT 1/5 Take 1 tablet by mouth every evening.   pantoprazole 40 MG tablet Commonly known as: PROTONIX Take 40 mg by mouth every evening.   propranolol 10 MG tablet Commonly known as: INDERAL Take 0.5 tablets (5 mg total) by mouth 2 (two) times daily. What changed: how much to take   thiamine 100 MG tablet Commonly known as: VITAMIN B1 Take 1 tablet (100 mg total) by mouth daily.   Viibryd 40 MG Tabs Generic drug: Vilazodone HCl TAKE 1 TABLET(40 MG) BY MOUTH DAILY        Allergies  Allergen Reactions   Penicillins Anaphylaxis    Has patient had a PCN reaction causing immediate rash, facial/tongue/throat swelling, SOB or lightheadedness with hypotension: Yes Has patient had a PCN reaction causing severe rash involving mucus membranes or skin necrosis: No Has patient had a PCN reaction that required hospitalization: No Has patient had a PCN reaction occurring within the last 10 years: No If all of the above answers are "NO", then may proceed with Cephalosporin use.    Cheese Other (See Comments)    Headache   Chocolate Other (See Comments)    Headache    Lunesta [Eszopiclone]     Had an episode of memory loss  after taking it   Other Other (See Comments)    MSG-Headache   Prednisone Swelling    NAUSEA, STOMACH ISSUES   Epinephrine Palpitations    Heart racing    Consultations: none   Procedures/Studies: US RENAL  Result Date: 02/19/2022 CLINICAL DATA:  Acute renal injury EXAM: RENAL / URINARY TRACT ULTRASOUND COMPLETE COMPARISON:  None Available. FINDINGS: Right Kidney: Renal measurements: 10.2 x 4.4 x 5.5 cm. = volume: 128 mL. Echogenicity within normal limits. No mass or hydronephrosis visualized. Left Kidney: Renal measurements: 11.3 x 4.8 x 5.2 cm. = volume: 147 mL. Echogenicity within normal limits. No mass or hydronephrosis visualized. Bladder: Appears normal for degree of bladder distention. Other: None. IMPRESSION: No acute abnormality noted. Electronically Signed   By: Alcide Clever M.D.   On: 02/19/2022 19:58   DG Chest 2 View  Result Date: 02/18/2022 CLINICAL DATA:  Dyspnea EXAM: CHEST - 2 VIEW COMPARISON:  Radiographs 06/16/2021 FINDINGS: No focal consolidation,  pleural effusion, or pneumothorax. Normal cardiomediastinal silhouette. No acute osseous abnormality. IMPRESSION: No active cardiopulmonary disease. Electronically Signed   By: Minerva Festeryler  Stutzman M.D.   On: 02/18/2022 23:00   (Echo, Carotid, EGD, Colonoscopy, ERCP)    Subjective:  Resting in bed anxious to go home no new complaints no chest pain palpitations headaches Discharge Exam: Vitals:   02/20/22 0722 02/20/22 1040  BP: 126/73 117/77  Pulse: 63 62  Resp: 16 17  Temp: 98.4 F (36.9 C) 98.6 F (37 C)  SpO2: 98% 100%   Vitals:   02/19/22 2326 02/20/22 0456 02/20/22 0722 02/20/22 1040  BP: 119/81 111/75 126/73 117/77  Pulse: 60 60 63 62  Resp: 17 19 16 17   Temp: 98.3 F (36.8 C) 98.1 F (36.7 C) 98.4 F (36.9 C) 98.6 F (37 C)  TempSrc: Oral Oral Oral Oral  SpO2: 99% 97% 98% 100%  Weight:      Height:        General: Pt is alert, awake, not in acute distress Cardiovascular: RRR, S1/S2 +, no rubs, no  gallops Respiratory: CTA bilaterally, no wheezing, no rhonchi Abdominal: Soft, NT, ND, bowel sounds + Extremities: no edema, no cyanosis    The results of significant diagnostics from this hospitalization (including imaging, microbiology, ancillary and laboratory) are listed below for reference.     Microbiology: Recent Results (from the past 240 hour(s))  Resp Panel by RT-PCR (Flu A&B, Covid) Anterior Nasal Swab     Status: None   Collection Time: 02/18/22  9:50 PM   Specimen: Anterior Nasal Swab  Result Value Ref Range Status   SARS Coronavirus 2 by RT PCR NEGATIVE NEGATIVE Final    Comment: (NOTE) SARS-CoV-2 target nucleic acids are NOT DETECTED.  The SARS-CoV-2 RNA is generally detectable in upper respiratory specimens during the acute phase of infection. The lowest concentration of SARS-CoV-2 viral copies this assay can detect is 138 copies/mL. A negative result does not preclude SARS-Cov-2 infection and should not be used as the sole basis for treatment or other patient management decisions. A negative result may occur with  improper specimen collection/handling, submission of specimen other than nasopharyngeal swab, presence of viral mutation(s) within the areas targeted by this assay, and inadequate number of viral copies(<138 copies/mL). A negative result must be combined with clinical observations, patient history, and epidemiological information. The expected result is Negative.  Fact Sheet for Patients:  BloggerCourse.comhttps://www.fda.gov/media/152166/download  Fact Sheet for Healthcare Providers:  SeriousBroker.ithttps://www.fda.gov/media/152162/download  This test is no t yet approved or cleared by the [place] FDA and  has been authorized for detection and/or diagnosis of SARS-CoV-2 by FDA under an Emergency Use Authorization (EUA). This EUA will remain  in effect (meaning this test can be used) for the duration of the COVID-19 declaration under Section 564(b)(1) of the Act,  21 U.S.C.section 360bbb-3(b)(1), unless the authorization is terminated  or revoked sooner.       Influenza A by PCR NEGATIVE NEGATIVE Final   Influenza B by PCR NEGATIVE NEGATIVE Final    Comment: (NOTE) The Xpert Xpress SARS-CoV-2/FLU/RSV plus assay is intended as an aid in the diagnosis of influenza from Nasopharyngeal swab specimens and should not be used as a sole basis for treatment. Nasal washings and aspirates are unacceptable for Xpert Xpress SARS-CoV-2/FLU/RSV testing.  Fact Sheet for Patients: BloggerCourse.comhttps://www.fda.gov/media/152166/download  Fact Sheet for Healthcare Providers: SeriousBroker.ithttps://www.fda.gov/media/152162/download  This test is not yet approved or cleared by the [place] FDA and has been authorized for detection and/or diagnosis  of SARS-CoV-2 by FDA under an Emergency Use Authorization (EUA). This EUA will remain in effect (meaning this test can be used) for the duration of the COVID-19 declaration under Section 564(b)(1) of the Act, 21 U.S.C. section 360bbb-3(b)(1), unless the authorization is terminated or revoked.  Performed at [facility], [address] [place], [place] [address]      Labs: BNP (last 3 results) No results for input(s): "BNP" in the last 8760 hours. Basic Metabolic Panel: Recent Labs  Lab 02/18/22 2150 02/19/22 0036 02/19/22 1813 02/20/22 0250  NA 132* 133* 136 140  K >7.5* 5.2* 3.8 4.0  CL 94* 101 104 111  CO2 20* 21* 25 23  GLUCOSE 114* 256* 88 92  BUN 20 22* 15 10  CREATININE 2.03* 1.76* 0.97 0.79  CALCIUM 12.3* 10.2 9.5 8.7*   Liver Function Tests: No results for input(s): "AST", "ALT", "ALKPHOS", "BILITOT", "PROT", "ALBUMIN" in the last 168 hours. No results for input(s): "LIPASE", "AMYLASE" in the last 168 hours. No results for input(s): "AMMONIA" in the last 168 hours. CBC: Recent Labs  Lab 02/18/22 2150 02/20/22 0250  WBC 15.3* 5.6  HGB 16.2* 11.7*  HCT 46.2* 34.5*  MCV 88.3 92.5  PLT  467* 248   Cardiac Enzymes: Recent Labs  Lab 02/19/22 1813  CKTOTAL 82   BNP: Invalid input(s): "POCBNP" CBG: Recent Labs  Lab 02/19/22 0110 02/19/22 0200 02/19/22 0203 02/19/22 0249 02/19/22 0409  GLUCAP 40* 82 89 115* 116*   D-Dimer No results for input(s): "DDIMER" in the last 72 hours. Hgb A1c Recent Labs    02/19/22 1813  HGBA1C 4.9   Lipid Profile No results for input(s): "CHOL", "HDL", "LDLCALC", "TRIG", "CHOLHDL", "LDLDIRECT" in the last 72 hours. Thyroid function studies No results for input(s): "TSH", "T4TOTAL", "T3FREE", "THYROIDAB" in the last 72 hours.  Invalid input(s): "FREET3" Anemia work up No results for input(s): "VITAMINB12", "FOLATE", "FERRITIN", "TIBC", "IRON", "RETICCTPCT" in the last 72 hours. Urinalysis    Component Value Date/Time   COLORURINE YELLOW 02/19/2022 1809   APPEARANCEUR CLEAR 02/19/2022 1809   LABSPEC 1.009 02/19/2022 1809   PHURINE 6.0 02/19/2022 1809   GLUCOSEU NEGATIVE 02/19/2022 1809   HGBUR NEGATIVE 02/19/2022 1809   BILIRUBINUR NEGATIVE 02/19/2022 1809   KETONESUR NEGATIVE 02/19/2022 1809   PROTEINUR NEGATIVE 02/19/2022 1809   UROBILINOGEN 0.2 06/08/2012 2240   NITRITE NEGATIVE 02/19/2022 1809   LEUKOCYTESUR NEGATIVE 02/19/2022 1809   Sepsis Labs Recent Labs  Lab 02/18/22 2150 02/20/22 0250  WBC 15.3* 5.6   Microbiology Recent Results (from the past 240 hour(s))  Resp Panel by RT-PCR (Flu A&B, Covid) Anterior Nasal Swab     Status: None   Collection Time: 02/18/22  9:50 PM   Specimen: Anterior Nasal Swab  Result Value Ref Range Status   SARS Coronavirus 2 by RT PCR NEGATIVE NEGATIVE Final    Comment: (NOTE) SARS-CoV-2 target nucleic acids are NOT DETECTED.  The SARS-CoV-2 RNA is generally detectable in upper respiratory specimens during the acute phase of infection. The lowest concentration of SARS-CoV-2 viral copies this assay can detect is 138 copies/mL. A negative result does not preclude  SARS-Cov-2 infection and should not be used as the sole basis for treatment or other patient management decisions. A negative result may occur with  improper specimen collection/handling, submission of specimen other than nasopharyngeal swab, presence of viral mutation(s) within the areas targeted by this assay, and inadequate number of viral copies(<138 copies/mL). A negative result must be combined with clinical  observations, patient history, and epidemiological information. The expected result is Negative.  Fact Sheet for Patients:  BloggerCourse.com  Fact Sheet for Healthcare Providers:  SeriousBroker.it  This test is no t yet approved or cleared by the [place] FDA and  has been authorized for detection and/or diagnosis of SARS-CoV-2 by FDA under an Emergency Use Authorization (EUA). This EUA will remain  in effect (meaning this test can be used) for the duration of the COVID-19 declaration under Section 564(b)(1) of the Act, 21 U.S.C.section 360bbb-3(b)(1), unless the authorization is terminated  or revoked sooner.       Influenza A by PCR NEGATIVE NEGATIVE Final   Influenza B by PCR NEGATIVE NEGATIVE Final    Comment: (NOTE) The Xpert Xpress SARS-CoV-2/FLU/RSV plus assay is intended as an aid in the diagnosis of influenza from Nasopharyngeal swab specimens and should not be used as a sole basis for treatment. Nasal washings and aspirates are unacceptable for Xpert Xpress SARS-CoV-2/FLU/RSV testing.  Fact Sheet for Patients: BloggerCourse.com  Fact Sheet for Healthcare Providers: SeriousBroker.it  This test is not yet approved or cleared by the [place] FDA and has been authorized for detection and/or diagnosis of SARS-CoV-2 by FDA under an Emergency Use Authorization (EUA). This EUA will remain in effect (meaning this test can be used) for the duration of  the COVID-19 declaration under Section 564(b)(1) of the Act, 21 U.S.C. section 360bbb-3(b)(1), unless the authorization is terminated or revoked.  Performed at [facility], [address] [place], [place] [address]      Time coordinating discharge: 39 minutes  SIGNED:  Alwyn Ren, MD  [facility] 02/20/2022, 3:08 PM

## 2022-02-20 NOTE — Progress Notes (Signed)
[facility] night cross cover note:   I was notified by RN of patient's request that her Focalin be changed from scheduled to prn , as she taken this medication only on prn basis at home. I subsequently changed her Focalin order to prn, as requested.      Newton Pigg, DO Hospitalist

## 2022-04-21 ENCOUNTER — Telehealth (HOSPITAL_COMMUNITY): Payer: Commercial Managed Care - HMO | Admitting: Psychiatry

## 2022-05-05 ENCOUNTER — Telehealth (HOSPITAL_BASED_OUTPATIENT_CLINIC_OR_DEPARTMENT_OTHER): Payer: Commercial Managed Care - HMO | Admitting: Psychiatry

## 2022-05-05 ENCOUNTER — Other Ambulatory Visit (HOSPITAL_COMMUNITY): Payer: Self-pay

## 2022-05-05 DIAGNOSIS — F411 Generalized anxiety disorder: Secondary | ICD-10-CM | POA: Diagnosis not present

## 2022-05-05 DIAGNOSIS — F410 Panic disorder [episodic paroxysmal anxiety] without agoraphobia: Secondary | ICD-10-CM

## 2022-05-05 DIAGNOSIS — F4310 Post-traumatic stress disorder, unspecified: Secondary | ICD-10-CM

## 2022-05-05 DIAGNOSIS — F332 Major depressive disorder, recurrent severe without psychotic features: Secondary | ICD-10-CM

## 2022-05-05 DIAGNOSIS — F17200 Nicotine dependence, unspecified, uncomplicated: Secondary | ICD-10-CM

## 2022-05-05 DIAGNOSIS — F430 Acute stress reaction: Secondary | ICD-10-CM

## 2022-05-05 DIAGNOSIS — F502 Bulimia nervosa: Secondary | ICD-10-CM

## 2022-05-05 DIAGNOSIS — F17210 Nicotine dependence, cigarettes, uncomplicated: Secondary | ICD-10-CM

## 2022-05-05 DIAGNOSIS — F5105 Insomnia due to other mental disorder: Secondary | ICD-10-CM

## 2022-05-05 DIAGNOSIS — F99 Mental disorder, not otherwise specified: Secondary | ICD-10-CM

## 2022-05-05 MED ORDER — DEXMETHYLPHENIDATE HCL ER 10 MG PO CP24
20.0000 mg | ORAL_CAPSULE | Freq: Every day | ORAL | 0 refills | Status: DC
  Filled 2022-05-05: qty 60, 30d supply, fill #0

## 2022-05-05 MED ORDER — VIIBRYD 40 MG PO TABS: ORAL_TABLET | ORAL | 0 refills | Status: DC

## 2022-05-05 MED ORDER — PROPRANOLOL HCL 10 MG PO TABS
5.0000 mg | ORAL_TABLET | Freq: Two times a day (BID) | ORAL | 0 refills | Status: DC
Start: 2022-05-05 — End: 2022-06-23

## 2022-05-05 MED ORDER — BUPROPION HCL ER (XL) 150 MG PO TB24: ORAL_TABLET | ORAL | 0 refills | Status: DC
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
## 2022-05-06 ENCOUNTER — Telehealth (HOSPITAL_COMMUNITY): Payer: Self-pay

## 2022-05-06 NOTE — Telephone Encounter (Signed)
Pharmacy sent a fax stating that the drug is not covered by patients plan. The preferred alternative is Escitalopram oxalate, Fluoxetine HCL, Sertraline HCL. Please advise.    VIIBRYD 40 MG TABS 90 tablet 0 05/05/2022    Sig: TAKE 1 TABLET(40 MG) BY MOUTH DAILY   Sent to pharmacy as: VIIBRYD 40 MG Tab   E-Prescribing Status: Receipt confirmed by pharmacy (05/05/2022 10:41 AM EDT)

## 2022-05-11 ENCOUNTER — Other Ambulatory Visit (HOSPITAL_COMMUNITY): Payer: Self-pay

## 2022-05-13 NOTE — Telephone Encounter (Signed)
Spoke to the pharmacy patient was able to pick the medication up with a cost of $52.25

## 2022-06-23 ENCOUNTER — Telehealth (HOSPITAL_BASED_OUTPATIENT_CLINIC_OR_DEPARTMENT_OTHER): Payer: Commercial Managed Care - HMO | Admitting: Psychiatry

## 2022-06-23 DIAGNOSIS — F17210 Nicotine dependence, cigarettes, uncomplicated: Secondary | ICD-10-CM

## 2022-06-23 DIAGNOSIS — F410 Panic disorder [episodic paroxysmal anxiety] without agoraphobia: Secondary | ICD-10-CM

## 2022-06-23 DIAGNOSIS — F332 Major depressive disorder, recurrent severe without psychotic features: Secondary | ICD-10-CM

## 2022-06-23 DIAGNOSIS — F411 Generalized anxiety disorder: Secondary | ICD-10-CM | POA: Diagnosis not present

## 2022-06-23 DIAGNOSIS — F4310 Post-traumatic stress disorder, unspecified: Secondary | ICD-10-CM | POA: Diagnosis not present

## 2022-06-23 DIAGNOSIS — F430 Acute stress reaction: Secondary | ICD-10-CM

## 2022-06-23 DIAGNOSIS — F502 Bulimia nervosa: Secondary | ICD-10-CM | POA: Diagnosis not present

## 2022-06-23 DIAGNOSIS — F17200 Nicotine dependence, unspecified, uncomplicated: Secondary | ICD-10-CM

## 2022-06-23 MED ORDER — DEXMETHYLPHENIDATE HCL ER 20 MG PO CP24: 20.0000 mg | ORAL_CAPSULE | Freq: Every day | ORAL | 0 refills | Status: DC

## 2022-06-23 MED ORDER — VIIBRYD 40 MG PO TABS: ORAL_TABLET | ORAL | 0 refills | Status: DC

## 2022-06-23 MED ORDER — PROPRANOLOL HCL 10 MG PO TABS
5.0000 mg | ORAL_TABLET | Freq: Two times a day (BID) | ORAL | 0 refills | Status: DC
Start: 2022-06-23 — End: 2022-09-01

## 2022-06-23 MED ORDER — BUPROPION HCL ER (XL) 150 MG PO TB24: ORAL_TABLET | ORAL | 0 refills | Status: DC
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
## 2022-08-25 ENCOUNTER — Telehealth (HOSPITAL_COMMUNITY): Payer: Commercial Managed Care - HMO | Admitting: Psychiatry

## 2022-09-01 ENCOUNTER — Telehealth (HOSPITAL_BASED_OUTPATIENT_CLINIC_OR_DEPARTMENT_OTHER): Payer: Commercial Managed Care - HMO | Admitting: Psychiatry

## 2022-09-01 DIAGNOSIS — F502 Bulimia nervosa: Secondary | ICD-10-CM

## 2022-09-01 DIAGNOSIS — F332 Major depressive disorder, recurrent severe without psychotic features: Secondary | ICD-10-CM | POA: Diagnosis not present

## 2022-09-01 DIAGNOSIS — F410 Panic disorder [episodic paroxysmal anxiety] without agoraphobia: Secondary | ICD-10-CM

## 2022-09-01 DIAGNOSIS — F4310 Post-traumatic stress disorder, unspecified: Secondary | ICD-10-CM | POA: Diagnosis not present

## 2022-09-01 DIAGNOSIS — F430 Acute stress reaction: Secondary | ICD-10-CM

## 2022-09-01 DIAGNOSIS — F17200 Nicotine dependence, unspecified, uncomplicated: Secondary | ICD-10-CM

## 2022-09-01 DIAGNOSIS — F411 Generalized anxiety disorder: Secondary | ICD-10-CM

## 2022-09-01 MED ORDER — DEXMETHYLPHENIDATE HCL ER 20 MG PO CP24: 20.0000 mg | ORAL_CAPSULE | Freq: Every day | ORAL | 0 refills | Status: DC

## 2022-09-01 MED ORDER — BUPROPION HCL ER (XL) 150 MG PO TB24: ORAL_TABLET | ORAL | 0 refills | Status: DC

## 2022-09-01 MED ORDER — PROPRANOLOL HCL 10 MG PO TABS: 5.0000 mg | ORAL_TABLET | Freq: Two times a day (BID) | ORAL | 0 refills | Status: DC

## 2022-09-01 MED ORDER — VIIBRYD 40 MG PO TABS: ORAL_TABLET | ORAL | 0 refills | Status: DC

## 2022-09-01 NOTE — Progress Notes (Signed)
Virtual Visit via Video Note  I connected with Monica Jensen on 09/01/22 at  9:15 AM EST by  a video enabled telemedicine application and verified that I am speaking with the correct person using two identifiers.  Location: Patient: home Provider: office   I discussed the limitations of evaluation and management by telemedicine and the availability of in person appointments. The patient expressed understanding and agreed to proceed.  History of Present Illness: Monica Jensen shares that "things have been pretty good". She went thru some struggle in getting her initial LCSW application was denied a couple of weeks. She wrote a letter and it is now approved yesterday. It was very stressful and she was sad and anxious but is feeling better now. Monica Jensen isolated for 2 days after getting the initial denial. She was working with her therapist closely due to all the feelings that emerged from the denial.  She was upset but denies depression, isolation and anhedonia in the last 2 weeks. She denies SI/HI. Her sleep is good without any sleep aids. Monica Jensen had dealt with several triggers in the last few weeks but her PTSD symptoms were manageable. She experienced chills and intrusive memories and a few nightmares. Monica Jensen denies panic attacks. Monica Jensen shares that she has binged for 2 days more often in the last 2 weeks. The loss of control about not getting her license initially causes binging episodes. The last time she binged was Sunday and Monday. She purged by vomiting at the end of the binge episode. It frustrates her that she still struggles with binging.    Observations/Objective: Psychiatric Specialty Exam: ROS  Last menstrual period 06/03/2014.There is no height or weight on file to calculate BMI.  General Appearance: Fairly Groomed and Neat  Eye Contact:  Good  Speech:  Clear and Coherent and Normal Rate  Volume:  Normal  Mood:  Anxious  Affect:  Full Range  Thought Process:  Goal Directed, Linear, and  Descriptions of Associations: Intact  Orientation:  Full (Time, Place, and Person)  Thought Content:  Logical  Suicidal Thoughts:  No  Homicidal Thoughts:  No  Memory:  Immediate;   Good  Judgement:  Good  Insight:  Good  Psychomotor Activity:  Normal  Concentration:  Concentration: Good  Recall:  Good  Fund of Knowledge:  Good  Language:  Good  Akathisia:  No  Handed:  Right  AIMS (if indicated):     Assets:  Communication Skills Desire for Improvement Financial Resources/Insurance Housing Intimacy Resilience Social Support Talents/Skills Transportation Vocational/Educational  ADL's:  Intact  Cognition:  WNL  Sleep:        Assessment and Plan:     09/01/2022    9:28 AM 02/17/2022   10:47 AM 12/09/2021    1:17 PM 09/09/2021    9:23 AM 09/09/2021    9:18 AM  Depression screen PHQ 2/9  Decreased Interest 0 1 0 3   Down, Depressed, Hopeless 1 3 2 3 3  $ PHQ - 2 Score 1 4 2 6 3  $ Altered sleeping  3 3 3   $ Tired, decreased energy  3 3 3   $ Change in appetite  1 2 2   $ Feeling bad or failure about yourself   3 2 3   $ Trouble concentrating  3 0 3   Moving slowly or fidgety/restless  0 0 0   Suicidal thoughts  0 0 3   PHQ-9 Score  17 12 23   $ Difficult doing work/chores  Extremely dIfficult Very  difficult Extremely dIfficult     Flowsheet Row ED to Hosp-Admission (Discharged) from 02/18/2022 in [place] HF PCU Video Visit from 02/17/2022 in [facility] Video Visit from 12/09/2021 in [place] No Risk No Risk No Risk          Pt is aware that these meds carry a teratogenic risk. Pt will discuss plan of action if she does or plans to become pregnant in the future.  Status of current problems: increased binging episodes and anxiety due to stressor. Stressor resolved yesterday.    Medication management with supportive therapy. Risks and benefits, side effects and  alternative treatment options discussed with patient. Pt was given an opportunity to ask questions about medication, illness, and treatment. All current psychiatric medications have been reviewed and discussed with the patient and adjusted as clinically appropriate.  Pt verbalized understanding and verbal consent obtained for treatment.  Meds:  1. Bulimia nervosa - dexmethylphenidate (FOCALIN XR) 20 MG 24 hr capsule; Take 1 capsule (20 mg total) by mouth daily.  Dispense: 30 capsule; Refill: 0 - dexmethylphenidate (FOCALIN XR) 20 MG 24 hr capsule; Take 1 capsule (20 mg total) by mouth daily.  Dispense: 30 capsule; Refill: 0  2. Severe episode of recurrent major depressive disorder, without psychotic features ([place]) - buPROPion (WELLBUTRIN XL) 150 MG 24 hr tablet; TAKE 1 TABLET BY MOUTH( 150 MG) EVERY MORNING  Dispense: 90 tablet; Refill: 0 - VIIBRYD 40 MG TABS; TAKE 1 TABLET(40 MG) BY MOUTH DAILY  Dispense: 90 tablet; Refill: 0  3. GAD (generalized anxiety disorder) - propranolol (INDERAL) 10 MG tablet; Take 0.5 tablets (5 mg total) by mouth 2 (two) times daily.  Dispense: 90 tablet; Refill: 0 - VIIBRYD 40 MG TABS; TAKE 1 TABLET(40 MG) BY MOUTH DAILY  Dispense: 90 tablet; Refill: 0  4. PTSD (post-traumatic stress disorder) - VIIBRYD 40 MG TABS; TAKE 1 TABLET(40 MG) BY MOUTH DAILY  Dispense: 90 tablet; Refill: 0  5. Panic attack as reaction to stress - VIIBRYD 40 MG TABS; TAKE 1 TABLET(40 MG) BY MOUTH DAILY  Dispense: 90 tablet; Refill: 0  6. Nicotine use disorder - buPROPion (WELLBUTRIN XL) 150 MG 24 hr tablet; TAKE 1 TABLET BY MOUTH( 150 MG) EVERY MORNING  Dispense: 90 tablet; Refill: 0     Labs: none    Therapy: brief supportive therapy provided. Discussed psychosocial stressors in detail.      Collaboration of Care: Referral or follow-up with counselor/therapist AEB therapist biweekly  Patient/Guardian was advised Release of Information must be obtained prior to any record  release in order to collaborate their care with an outside provider. Patient/Guardian was advised if they have not already done so to contact the registration department to sign all necessary forms in order for [place] to release information regarding their care.   Consent: Patient/Guardian gives verbal consent for treatment and assignment of benefits for services provided during this visit. Patient/Guardian expressed understanding and agreed to proceed.      Follow Up Instructions: Follow up in 3 months or sooner if needed    I discussed the assessment and treatment plan with the patient. The patient was provided an opportunity to ask questions and all were answered. The patient agreed with the plan and demonstrated an understanding of the instructions.   The patient was advised to call back or seek an in-person evaluation if the symptoms worsen or if the condition fails to improve as  anticipated.  I provided 23 minutes of non-face-to-face time during this encounter.   Charlcie Cradle, MD

## 2022-09-26 ENCOUNTER — Telehealth (HOSPITAL_COMMUNITY): Payer: Self-pay | Admitting: *Deleted

## 2022-09-26 NOTE — Telephone Encounter (Signed)
Pt called with c/o not being able to fill the Vilazodone as the discount card is currently being accepted at pharmacies and out of pocket cost is $1,244.00. Pt request alternative medication as we do not have samples as medication is now generic. Pt next appointment scheduled for5/9/24. Please review and advise.

## 2022-09-29 ENCOUNTER — Telehealth (HOSPITAL_COMMUNITY): Payer: Self-pay | Admitting: *Deleted

## 2022-09-29 ENCOUNTER — Other Ambulatory Visit (HOSPITAL_COMMUNITY): Payer: Self-pay | Admitting: *Deleted

## 2022-09-29 MED ORDER — VILAZODONE HCL 40 MG PO TABS
40.0000 mg | ORAL_TABLET | Freq: Every day | ORAL | 0 refills | Status: DC
Start: 2022-09-29 — End: 2022-11-17

## 2022-09-29 NOTE — Telephone Encounter (Signed)
done

## 2022-09-29 NOTE — Telephone Encounter (Signed)
Pt called for second time stating that she cannot afford the brand name Viibryd 40 mg tabs. Seems as though last script was DAW. Her co pay will be $1.244. Manufacturer coupon no longer applies; possibly due to medication being generic? Ok to send in generic Valazodone 40 mg? Writer advised pt to call pharmacy to get her co pay price for that. Pt does not want to change medication. She has not been able to fill the last script sent on 09/01/22. Next appointment scheduled for 11/17/22. Please review and advise.

## 2022-10-03 ENCOUNTER — Telehealth (HOSPITAL_COMMUNITY): Payer: Self-pay | Admitting: *Deleted

## 2022-10-03 NOTE — Telephone Encounter (Signed)
DRUG CHANGE REQUEST-- [place], [place] - [address] AT [place] [address]   Vilazodone HCl (VIIBRYD) 40 MG TABS   MESSAGE:: DRUG NOT COVERED BY PATIENT'S PLAN-- THE PREFERRED ALTERNATIVE: ESCITALOPRAM OXALATE,, FLUOXETINE HCL,, SERTRALINE  HCL --

## 2022-10-07 ENCOUNTER — Telehealth (HOSPITAL_COMMUNITY): Payer: Self-pay | Admitting: *Deleted

## 2022-10-07 NOTE — Telephone Encounter (Signed)
Erroneous encounter

## 2022-10-14 ENCOUNTER — Emergency Department (HOSPITAL_BASED_OUTPATIENT_CLINIC_OR_DEPARTMENT_OTHER): Payer: Commercial Managed Care - HMO

## 2022-10-14 ENCOUNTER — Emergency Department (HOSPITAL_BASED_OUTPATIENT_CLINIC_OR_DEPARTMENT_OTHER)
Admission: EM | Admit: 2022-10-14 | Discharge: 2022-10-14 | Disposition: A | Discharge status: Home / Self Care | Payer: Commercial Managed Care - HMO | Attending: Emergency Medicine | Admitting: Emergency Medicine

## 2022-10-14 ENCOUNTER — Other Ambulatory Visit: Payer: Self-pay

## 2022-10-14 ENCOUNTER — Encounter (HOSPITAL_BASED_OUTPATIENT_CLINIC_OR_DEPARTMENT_OTHER): Payer: Self-pay | Admitting: Emergency Medicine

## 2022-10-14 DIAGNOSIS — K529 Noninfective gastroenteritis and colitis, unspecified: Secondary | ICD-10-CM | POA: Diagnosis not present

## 2022-10-14 DIAGNOSIS — R1032 Left lower quadrant pain: Secondary | ICD-10-CM | POA: Diagnosis present

## 2022-10-14 LAB — CBC
HCT: 37.5 % (ref 36.0–46.0)
Hemoglobin: 12.6 g/dL (ref 12.0–15.0)
MCH: 30.7 pg (ref 26.0–34.0)
MCHC: 33.6 g/dL (ref 30.0–36.0)
MCV: 91.5 fL (ref 80.0–100.0)
Platelets: 272 10*3/uL (ref 150–400)
RBC: 4.1 MIL/uL (ref 3.87–5.11)
RDW: 13 % (ref 11.5–15.5)
WBC: 7.8 10*3/uL (ref 4.0–10.5)
nRBC: 0 % (ref 0.0–0.2)

## 2022-10-14 LAB — COMPREHENSIVE METABOLIC PANEL
ALT: 9 U/L (ref 0–44)
AST: 18 U/L (ref 15–41)
Albumin: 4.2 g/dL (ref 3.5–5.0)
Alkaline Phosphatase: 57 U/L (ref 38–126)
Anion gap: 9 (ref 5–15)
BUN: 12 mg/dL (ref 6–20)
CO2: 25 mmol/L (ref 22–32)
Calcium: 9.6 mg/dL (ref 8.9–10.3)
Chloride: 102 mmol/L (ref 98–111)
Creatinine, Ser: 0.74 mg/dL (ref 0.44–1.00)
GFR, Estimated: >60 mL/min (ref >60)
Glucose, Bld: 93 mg/dL (ref 70–99)
Potassium: 3.9 mmol/L (ref 3.5–5.1)
Sodium: 136 mmol/L (ref 135–145)
Total Bilirubin: 1 mg/dL (ref 0.3–1.2)
Total Protein: 6.9 g/dL (ref 6.5–8.1)

## 2022-10-14 LAB — LIPASE, BLOOD: Lipase: 28 U/L (ref 11–51)

## 2022-10-14 MED ORDER — ONDANSETRON 4 MG PO TBDP: 4.0000 mg | ORAL_TABLET | Freq: Three times a day (TID) | ORAL | 0 refills | Status: DC | PRN

## 2022-10-14 MED ORDER — METRONIDAZOLE 500 MG PO TABS: 500.0000 mg | ORAL_TABLET | Freq: Three times a day (TID) | ORAL | 0 refills | Status: AC

## 2022-10-14 MED ORDER — CIPROFLOXACIN HCL 500 MG PO TABS: 500.0000 mg | ORAL_TABLET | Freq: Two times a day (BID) | ORAL | 0 refills | Status: DC

## 2022-10-14 MED ORDER — HYOSCYAMINE SULFATE SL 0.125 MG SL SUBL: 0.1250 mg | SUBLINGUAL_TABLET | SUBLINGUAL | 0 refills | Status: AC | PRN

## 2022-10-14 MED ORDER — FENTANYL CITRATE PF 50 MCG/ML IJ SOSY
50.0000 ug | PREFILLED_SYRINGE | Freq: Once | INTRAMUSCULAR | Status: AC
  Administered 2022-10-14: 50 ug via NASAL
  Filled 2022-10-14: qty 1

## 2022-10-14 MED ORDER — IOHEXOL 300 MG/ML  SOLN
100.0000 mL | Freq: Once | INTRAMUSCULAR | Status: AC | PRN
  Administered 2022-10-14: 70 mL via INTRAVENOUS

## 2022-10-14 MED ORDER — ONDANSETRON HCL 4 MG/2ML IJ SOLN
4.0000 mg | Freq: Once | INTRAMUSCULAR | Status: AC
  Administered 2022-10-14: 4 mg via INTRAVENOUS
  Filled 2022-10-14: qty 2

## 2022-10-14 MED ORDER — OXYCODONE HCL 5 MG PO TABS: 2.5000 mg | ORAL_TABLET | ORAL | 0 refills | Status: DC | PRN

## 2022-10-14 NOTE — ED Notes (Signed)
Pt aware of the need for a urine... Currently unable to provide.Marland KitchenMarland Kitchen

## 2022-10-14 NOTE — ED Triage Notes (Signed)
Pt arrives to ED with c/o intense LLQ abdominal pain that started yesterday. She notes that she had a colonoscopy and endoscopy yesterday with [facility] and the abdominal pain started shortly after procedure.

## 2022-10-14 NOTE — ED Notes (Signed)
Second attempt of calling [facility] -- spoke to [place], informed that PA haven't received a call back yet; was told that there will be a call back shortly.

## 2022-10-14 NOTE — Discharge Instructions (Signed)
Contact a health care provider if: Your symptoms do not go away. You develop new symptoms. Get help right away if: You have a fever that does not go away with treatment. You develop chills. You have extreme weakness, fainting, or dehydration. You vomit repeatedly. You develop severe pain in your abdomen. You pass bloody or tarry stool. 

## 2022-10-14 NOTE — ED Provider Notes (Signed)
[place] EMERGENCY DEPARTMENT AT [facility] Provider Note   CSN: [id number] Arrival date & time: 10/14/22  6314     History {Add pertinent medical, surgical, social history, OB history to HPI:1} Chief Complaint  Patient presents with   Abdominal Pain    Monica Jensen is a 57 y.o. female who presents emergency department with chief complaint of left lower quadrant abdominal pain.  Patient reports undergoing endoscopy/colonoscopy yesterday.  She is a patient of [facility].  She takes a PPI daily.  She has a history of bowel obstruction.  Patient began having progressively worsening left lower quadrant abdominal pain worse with movement coughing laughing.  She has not yet made a bowel movement.  She denies nausea, vomiting, fever.  She called the on-call physician who said she needed to come in for CT scan to rule out perforation or splenic injury.  She has no other complaints at this time.  She took Advil without relief of her symptoms.   Abdominal Pain      Home Medications Prior to Admission medications   Medication Sig Start Date End Date Taking? Authorizing Provider  buPROPion (WELLBUTRIN XL) 150 MG 24 hr tablet TAKE 1 TABLET BY MOUTH( 150 MG) EVERY MORNING 09/01/22   Oletta Darter, MD  Cholecalciferol (VITAMIN D-3) 125 MCG (5000 UT) TABS Take 1 tablet by mouth daily in the afternoon.    [provider]  dexmethylphenidate (FOCALIN XR) 20 MG 24 hr capsule Take 1 capsule (20 mg total) by mouth daily. 09/01/22   Oletta Darter, MD  dexmethylphenidate (FOCALIN XR) 20 MG 24 hr capsule Take 1 capsule (20 mg total) by mouth daily. 09/01/22   Oletta Darter, MD  folic acid (FOLVITE) 1 MG tablet Take 1 tablet (1 mg total) by mouth daily. 02/20/22   Alwyn Ren, MD  levothyroxine (SYNTHROID) 88 MCG tablet Take 88 mcg by mouth daily before breakfast.    [provider]  Multiple Vitamin (MULTIVITAMIN WITH MINERALS) TABS tablet Take 1 tablet  by mouth daily. 02/20/22   Alwyn Ren, MD  norethindrone-ethinyl estradiol (FEMHRT 1/5) 1-5 MG-MCG TABS tablet Take 1 tablet by mouth every evening.    [provider]  pantoprazole (PROTONIX) 40 MG tablet Take 40 mg by mouth every evening.     [provider]  propranolol (INDERAL) 10 MG tablet Take 0.5 tablets (5 mg total) by mouth 2 (two) times daily. 09/01/22   Oletta Darter, MD  thiamine (VITAMIN B1) 100 MG tablet Take 1 tablet (100 mg total) by mouth daily. 02/20/22   Alwyn Ren, MD  Vilazodone HCl (VIIBRYD) 40 MG TABS Take 1 tablet (40 mg total) by mouth daily. 09/29/22   Oletta Darter, MD      Allergies    Penicillins, Cheese, Chocolate, Lunesta [eszopiclone], Other, Prednisone, Venlafaxine, and Epinephrine    Review of Systems   Review of Systems  Gastrointestinal:  Positive for abdominal pain.    Physical Exam Updated Vital Signs BP 137/77 (BP Location: Right Arm)   Pulse 69   Temp 97.9 F (36.6 C) (Oral)   Resp 15   Ht 5\' 4"  (1.626 m)   Wt 52.2 kg   LMP 06/03/2014   SpO2 100%   BMI 19.74 kg/m  Physical Exam Vitals and nursing note reviewed.  Constitutional:      General: She is not in acute distress.    Appearance: She is well-developed. She is not diaphoretic.  HENT:     Head: Normocephalic  and atraumatic.     Right Ear: External ear normal.     Left Ear: External ear normal.     Nose: Nose normal.     Mouth/Throat:     Mouth: Mucous membranes are moist.  Eyes:     General: No scleral icterus.    Conjunctiva/sclera: Conjunctivae normal.  Cardiovascular:     Rate and Rhythm: Normal rate and regular rhythm.     Heart sounds: Normal heart sounds. No murmur heard.    No friction rub. No gallop.  Pulmonary:     Effort: Pulmonary effort is normal. No respiratory distress.     Breath sounds: Normal breath sounds.  Abdominal:     General: Bowel sounds are normal. There is no distension.     Palpations: Abdomen is soft.  There is no mass.     Tenderness: There is abdominal tenderness in the left lower quadrant. There is guarding.  Musculoskeletal:     Cervical back: Normal range of motion.  Skin:    General: Skin is warm and dry.  Neurological:     Mental Status: She is alert and oriented to person, place, and time.  Psychiatric:        Behavior: Behavior normal.     ED Results / Procedures / Treatments   Labs (all labs ordered are listed, but only abnormal results are displayed) Labs Reviewed  LIPASE, BLOOD  COMPREHENSIVE METABOLIC PANEL  CBC  URINALYSIS, ROUTINE W REFLEX MICROSCOPIC    EKG None  Radiology No results found.  Procedures Procedures  {Document cardiac monitor, telemetry assessment procedure when appropriate:1}  Medications Ordered in ED Medications  fentaNYL (SUBLIMAZE) injection 50 mcg (has no administration in time range)  ondansetron (ZOFRAN) injection 4 mg (has no administration in time range)    ED Course/ Medical Decision Making/ A&P Clinical Course as of 10/14/22 0927  Fri Oct 14, 2022  0926 Initial impression: 57 year old female here with left lower quadrant pain after colonoscopy/endoscopy.  Differential diagnosis includes bowel perforation, splenic injury with bleeding and localized peritonitis, diverticulitis, proctitis, colitis.  Patient is well-appearing.  I have addressed pain concerns and patient would like pain control.  She is otherwise hemodynamically stable.  Workup initiated including basic labs CT scan of the abdomen and pelvis with contrast. [AH]    Clinical Course User Index [AH] Arthor CaptainHarris, Jonus Coble, PA-C                           Medical Decision Making Amount and/or Complexity of Data Reviewed Labs: ordered.   ***  {Document critical care time when appropriate:1} {Document review of labs and clinical decision tools ie heart score, Chads2Vasc2 etc:1}  {Document your independent review of radiology images, and any outside  records:1} {Document your discussion with family members, caretakers, and with consultants:1} {Document social determinants of health affecting pt's care:1} {Document your decision making why or why not admission, treatments were needed:1} Final Clinical Impression(s) / ED Diagnoses Final diagnoses:  None    Rx / DC Orders ED Discharge Orders     None

## 2022-10-18 IMAGING — DX DG CHEST 2V
2 series · 2 of 2 positions shown · non-contrast
Comparison: July 13, 2016.

CLINICAL DATA: Tobacco use.

EXAM:
CHEST - 2 VIEW

[dg chest 2 view (1 of 2)]
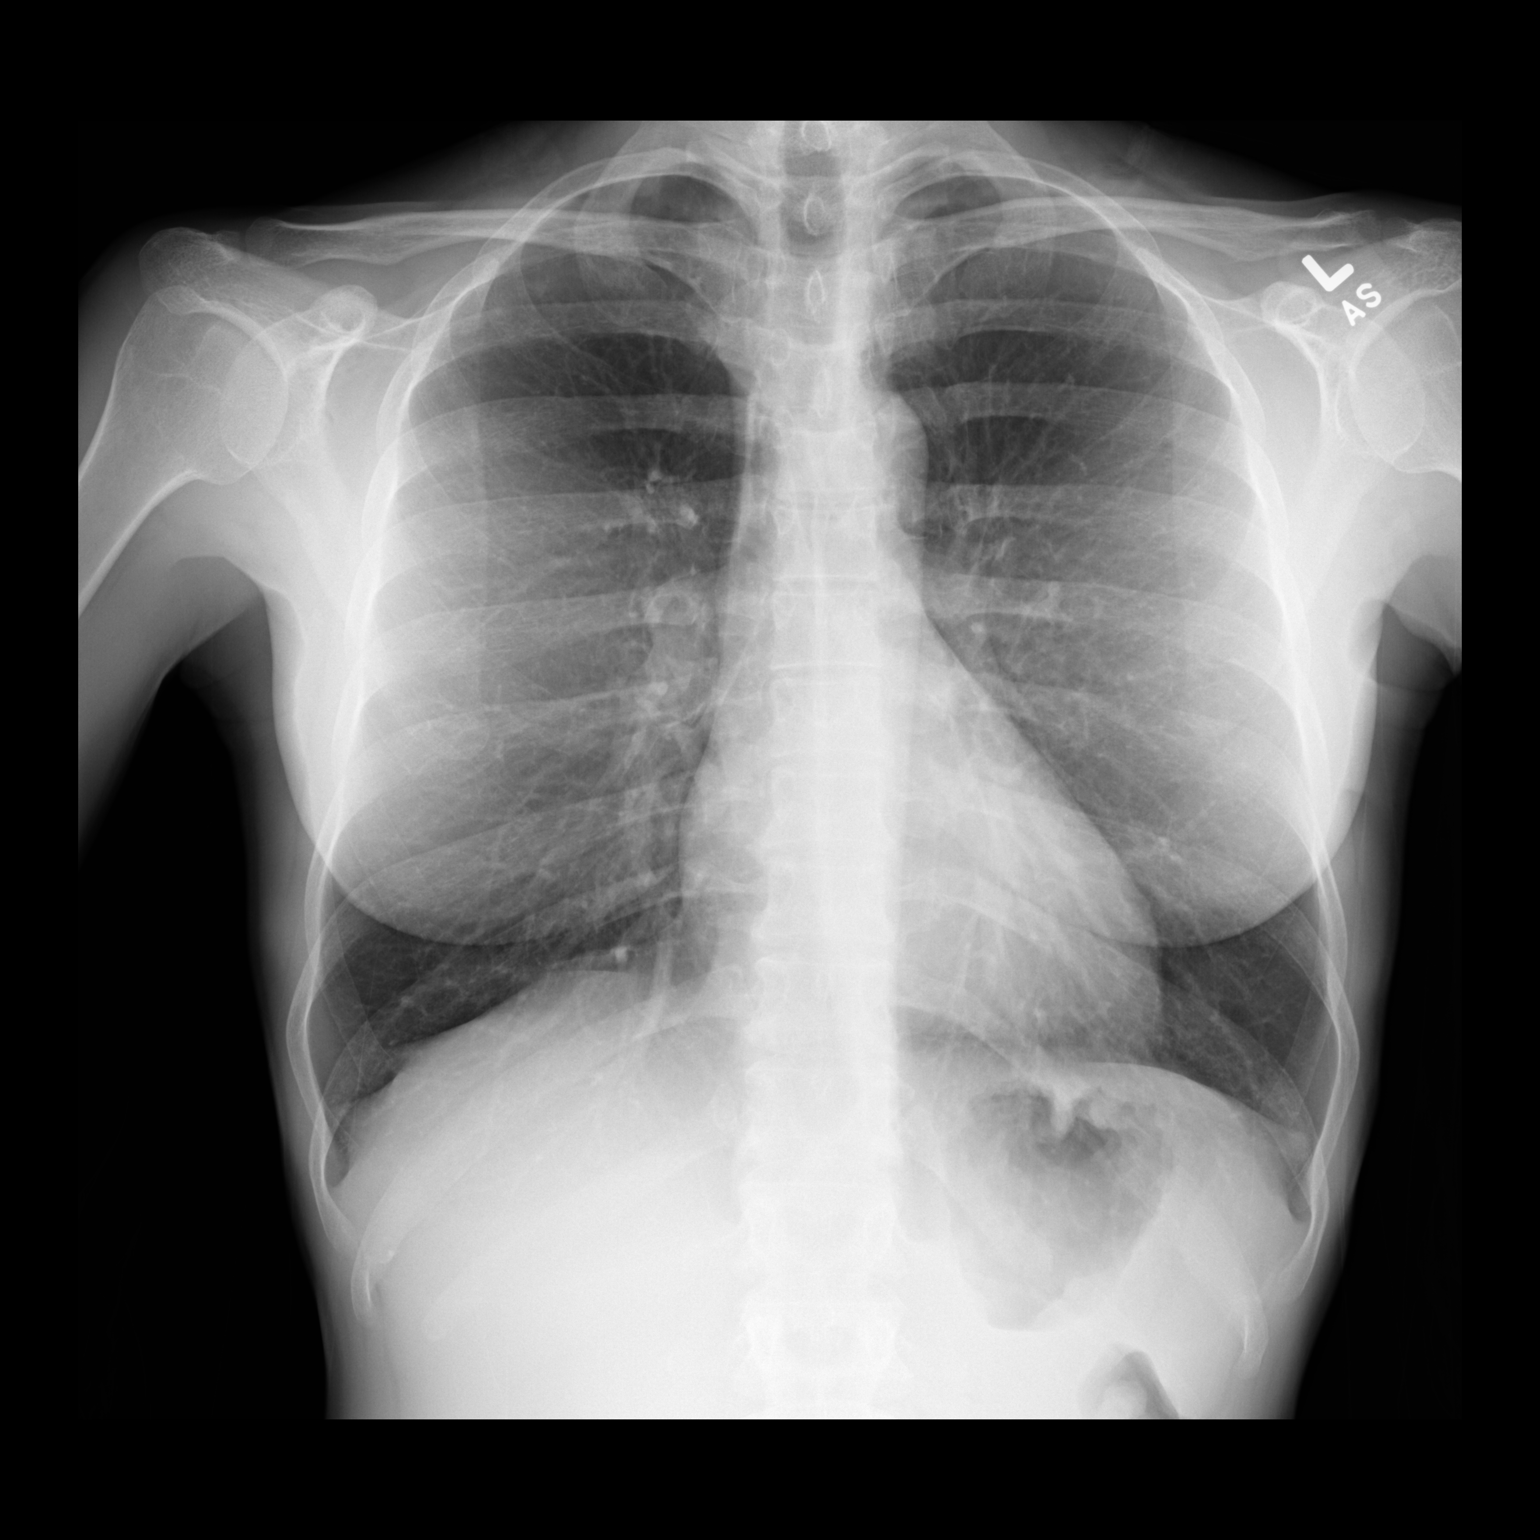

[dg chest 2 view (2 of 2)]
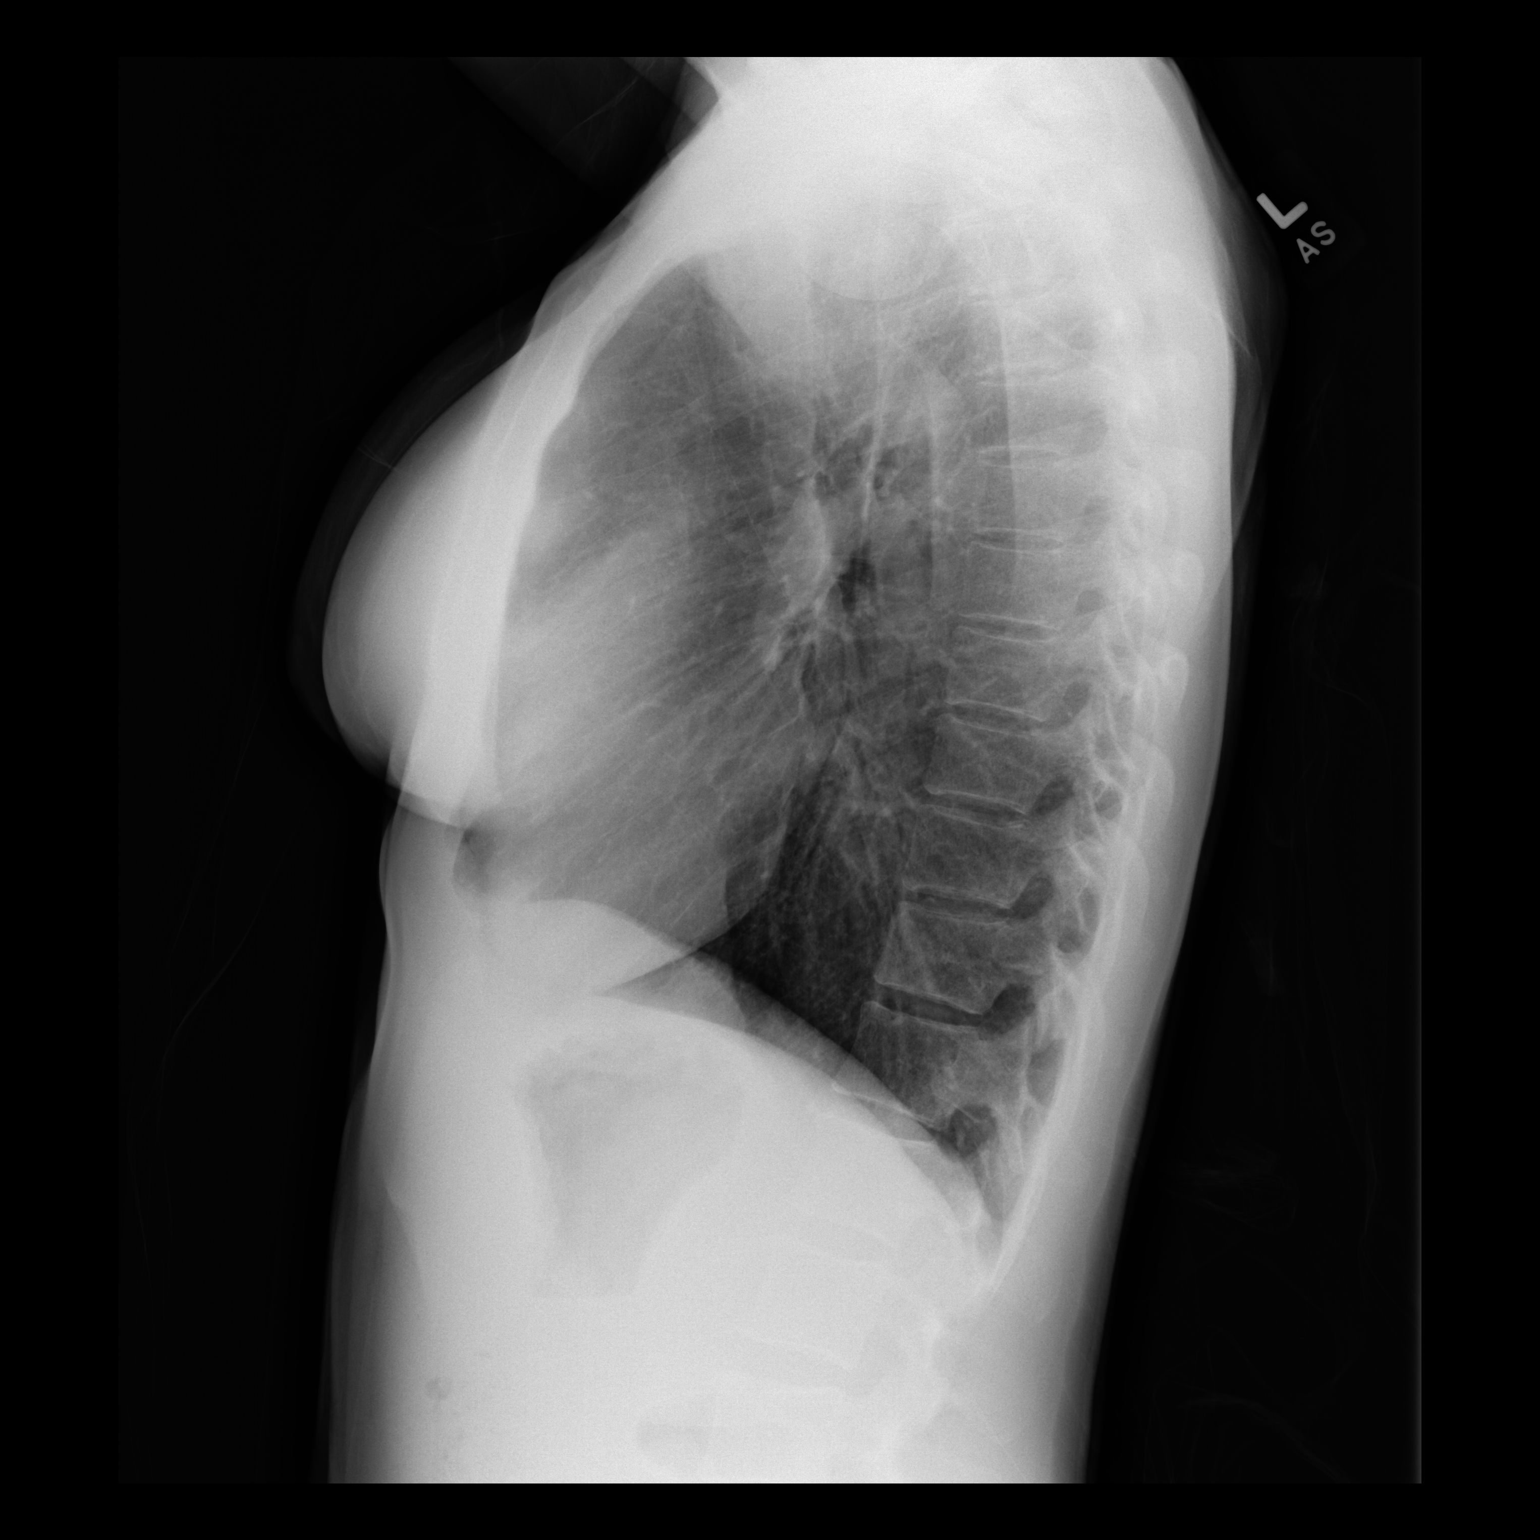

[2 of 2 positions shown; findings below may reference images not displayed]

FINDINGS: The heart size and mediastinal contours are within normal limits.
Both lungs are clear. The visualized skeletal structures are
unremarkable.
IMPRESSION: No active cardiopulmonary disease.

## 2022-11-14 ENCOUNTER — Encounter (HOSPITAL_COMMUNITY): Payer: Self-pay

## 2022-11-17 ENCOUNTER — Telehealth (HOSPITAL_BASED_OUTPATIENT_CLINIC_OR_DEPARTMENT_OTHER): Payer: Commercial Managed Care - HMO | Admitting: Psychiatry

## 2022-11-17 DIAGNOSIS — F332 Major depressive disorder, recurrent severe without psychotic features: Secondary | ICD-10-CM | POA: Diagnosis not present

## 2022-11-17 DIAGNOSIS — F4310 Post-traumatic stress disorder, unspecified: Secondary | ICD-10-CM | POA: Diagnosis not present

## 2022-11-17 DIAGNOSIS — F410 Panic disorder [episodic paroxysmal anxiety] without agoraphobia: Secondary | ICD-10-CM

## 2022-11-17 DIAGNOSIS — F411 Generalized anxiety disorder: Secondary | ICD-10-CM | POA: Diagnosis not present

## 2022-11-17 DIAGNOSIS — F502 Bulimia nervosa, unspecified: Secondary | ICD-10-CM

## 2022-11-17 DIAGNOSIS — F430 Acute stress reaction: Secondary | ICD-10-CM

## 2022-11-17 MED ORDER — DEXMETHYLPHENIDATE HCL ER 20 MG PO CP24: 20.0000 mg | ORAL_CAPSULE | Freq: Every day | ORAL | 0 refills | Status: DC

## 2022-11-17 MED ORDER — PROPRANOLOL HCL 10 MG PO TABS: 5.0000 mg | ORAL_TABLET | Freq: Two times a day (BID) | ORAL | 0 refills | Status: DC

## 2022-11-17 MED ORDER — BUPROPION HCL ER (XL) 150 MG PO TB24
ORAL_TABLET | ORAL | 0 refills | Status: DC
Start: 2022-11-17 — End: 2023-04-28

## 2022-11-17 MED ORDER — DEXMETHYLPHENIDATE HCL ER 20 MG PO CP24
20.0000 mg | ORAL_CAPSULE | Freq: Every day | ORAL | 0 refills | Status: DC
Start: 2022-11-17 — End: 2023-10-16

## 2022-11-17 MED ORDER — VILAZODONE HCL 40 MG PO TABS: 40.0000 mg | ORAL_TABLET | Freq: Every day | ORAL | 0 refills | Status: DC

## 2022-11-17 NOTE — Progress Notes (Signed)
Virtual Visit via Video Note  I connected with Monica Jensen on 11/17/22 at  9:00 AM EDT by a video enabled telemedicine application and verified that I am speaking with the correct person using two identifiers.  Location: Patient: home Provider: office   I discussed the limitations of evaluation and management by telemedicine and the availability of in person appointments. The patient expressed understanding and agreed to proceed.  History of Present Illness: Monica Jensen shares she is "doing well". 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 She is worried because she has not disclosed the DUI to them yet. Her DUI has made it hard to apply for different jobs. She is frustrated. Every time she has to deal with an issue of the DUI it triggers her PTSD- feelings and behaviors from that night, anger, consequences and anxiety. She denies flashbacks and nightmares. Her anxiety is mostly situational and lately centered on applying for jobs and her finances. She denies panic attacks. She is feeling positive about the future. The relationship with her boyfriend is going well. He is very supportive and loving. Her sleep is good without any sleep aids. Some nights she wakes up due to hot flashes. She is working with her gynecologist.  Monica Jensen states her depression her is stable and mild. She gets down about 1 day/week. She denies hopelessness, anhedonia and isolation. She denies SI/HI. At home by herself when overwhelmed she will binge and then purge. This happens about 2x/week for up to 24 hours. This is better than before. The Focalin is "iffy". Certain times it will exacerbate her anxiety and other times it is fine. When making her anxious the anxiety lasts for 2 hrs. The Focalin XR effect on her concentration lasts all day.     Observations/Objective: Psychiatric Specialty Exam: ROS  Last menstrual period 06/03/2014.There is no height or weight on file to  calculate BMI.  General Appearance: Casual  Eye Contact:  Good  Speech:  Clear and Coherent and Normal Rate  Volume:  Normal  Mood:  Euthymic  Affect:  Full Range  Thought Process:  Goal Directed, Linear, and Descriptions of Associations: Intact  Orientation:  Full (Time, Place, and Person)  Thought Content:  Logical  Suicidal Thoughts:  No  Homicidal Thoughts:  No  Memory:  Immediate;   Good  Judgement:  Good  Insight:  Good  Psychomotor Activity:  Normal  Concentration:  Concentration: Good  Recall:  Good  Fund of Knowledge:  Good  Language:  Good  Akathisia:  No  Handed:  Right  AIMS (if indicated):     Assets:  Communication Skills Desire for Improvement Financial Resources/Insurance Housing Intimacy Leisure Time Resilience Social Support Talents/Skills Transportation Vocational/Educational  ADL's:  Intact  Cognition:  WNL  Sleep:        Assessment and Plan:     11/17/2022    9:14 AM 09/01/2022    9:28 AM 02/17/2022   10:47 AM 12/09/2021    1:17 PM 09/09/2021    9:23 AM  Depression screen PHQ 2/9  Decreased Interest 0 0 1 0 3  Down, Depressed, Hopeless 1 1 3 2 3   PHQ - 2 Score 1 1 4 2 6   Altered sleeping   3 3 3   Tired, decreased energy   3 3 3   Change in appetite   1 2 2   Feeling bad or failure about yourself    3 2 3   Trouble  concentrating   3 0 3  Moving slowly or fidgety/restless   0 0 0  Suicidal thoughts   0 0 3  PHQ-9 Score   17 12 23   Difficult doing work/chores   Extremely dIfficult Very difficult Extremely dIfficult    Flowsheet Row Video Visit from 11/17/2022 in [facility] from 10/14/2022 in [facility] Department [facility] ED to Hosp-Admission (Discharged) from 02/18/2022 in [facility] 3E HF PCU  C-SSRS RISK CATEGORY No Risk No Risk No Risk          Pt is aware that these meds carry a teratogenic risk. Pt will discuss plan of action if she does or plans to become  pregnant in the future.  Status of current problems: her anxiety is mostly situational. She is struggling with binging and perjuring. Mood is stable but she still has some PTSD. She has quit smoking except for 1 every few months.    Medication management with supportive therapy. Risks and benefits, side effects and alternative treatment options discussed with patient. Pt was given an opportunity to ask questions about medication, illness, and treatment. All current psychiatric medications have been reviewed and discussed with the patient and adjusted as clinically appropriate.  Pt verbalized understanding and verbal consent obtained for treatment.  Meds:  1. Bulimia nervosa - dexmethylphenidate (FOCALIN XR) 20 MG 24 hr capsule; Take 1 capsule (20 mg total) by mouth daily.  Dispense: 30 capsule; Refill: 0 - dexmethylphenidate (FOCALIN XR) 20 MG 24 hr capsule; Take 1 capsule (20 mg total) by mouth daily.  Dispense: 30 capsule; Refill: 0  2. PTSD (post-traumatic stress disorder)  3. GAD (generalized anxiety disorder) - propranolol (INDERAL) 10 MG tablet; Take 0.5 tablets (5 mg total) by mouth 2 (two) times daily.  Dispense: 90 tablet; Refill: 0  4. Severe episode of recurrent major depressive disorder, without psychotic features (HCC) - buPROPion (WELLBUTRIN XL) 150 MG 24 hr tablet; TAKE 1 TABLET BY MOUTH( 150 MG) EVERY MORNING  Dispense: 90 tablet; Refill: 0  5. Panic attack as reaction to stress        Therapy: brief supportive therapy provided. Discussed psychosocial stressors in detail.     Collaboration of Care: Other encouraged to continue therapy  Patient/Guardian was advised Release of Information must be obtained prior to any record release in order to collaborate their care with an outside provider. Patient/Guardian was advised if they have not already done so to contact the registration department to sign all necessary forms in order for [place] to release information regarding their  care.   Consent: Patient/Guardian gives verbal consent for treatment and assignment of benefits for services provided during this visit. Patient/Guardian expressed understanding and agreed to proceed.       Follow Up Instructions: Follow up in 3 months or sooner if needed  Patient informed that I am leaving [facility] in 01/2023 and I relayed that they will be getting a new provider after that. Patient verbalized understanding and agreed with the plan.     I discussed the assessment and treatment plan with the patient. The patient was provided an opportunity to ask questions and all were answered. The patient agreed with the plan and demonstrated an understanding of the instructions.   The patient was advised to call back or seek an in-person evaluation if the symptoms worsen or if the condition fails to improve as anticipated.  I provided 27 minutes of non-face-to-face time during this encounter.  Charlcie Cradle, MD

## 2022-12-01 ENCOUNTER — Telehealth (HOSPITAL_COMMUNITY): Payer: Self-pay | Admitting: Psychiatry

## 2022-12-15 ENCOUNTER — Telehealth (HOSPITAL_COMMUNITY): Payer: Self-pay | Admitting: Psychiatry

## 2023-04-28 ENCOUNTER — Telehealth (HOSPITAL_BASED_OUTPATIENT_CLINIC_OR_DEPARTMENT_OTHER): Payer: 59 | Admitting: Psychiatry

## 2023-04-28 ENCOUNTER — Encounter (HOSPITAL_COMMUNITY): Payer: Self-pay

## 2023-04-28 ENCOUNTER — Encounter (HOSPITAL_COMMUNITY): Payer: Self-pay | Admitting: Psychiatry

## 2023-04-28 VITALS — Wt 116.0 lb

## 2023-04-28 DIAGNOSIS — F332 Major depressive disorder, recurrent severe without psychotic features: Secondary | ICD-10-CM | POA: Diagnosis not present

## 2023-04-28 DIAGNOSIS — F4310 Post-traumatic stress disorder, unspecified: Secondary | ICD-10-CM | POA: Diagnosis not present

## 2023-04-28 DIAGNOSIS — F411 Generalized anxiety disorder: Secondary | ICD-10-CM

## 2023-04-28 MED ORDER — VILAZODONE HCL 40 MG PO TABS
40.0000 mg | ORAL_TABLET | Freq: Every day | ORAL | 0 refills | Status: DC
Start: 2023-04-28 — End: 2023-07-17

## 2023-04-28 MED ORDER — BUPROPION HCL ER (XL) 150 MG PO TB24: ORAL_TABLET | ORAL | 0 refills | Status: DC

## 2023-04-28 MED ORDER — PROPRANOLOL HCL 10 MG PO TABS: 10.0000 mg | ORAL_TABLET | Freq: Every day | ORAL | 0 refills | Status: DC
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
## 2023-05-01 ENCOUNTER — Telehealth (HOSPITAL_COMMUNITY): Payer: Self-pay | Admitting: *Deleted
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
## 2023-05-03 ENCOUNTER — Encounter: Payer: Self-pay | Admitting: Physician Assistant

## 2023-05-24 NOTE — Progress Notes (Addendum)
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 MoCA today is 27/30.  She had neurocognitive testing about 12 years ago with negative results, but psychiatry had with suspicion of ADHD and probable ADD, not formally diagnosed.  There is unclear history of seizure like activity, but not workup has been performed for its diagnosis. MoCA 27/30. Workup is in progress.  Patient is able to participate on IADLs and continues to drive without significant difficulties.   Memory Impairment of unclear etiology  MRI brain without contrast to assess for underlying structural abnormality and assess vascular load  If no indication for antidementia medication at this moment. Neurocognitive testing to further evaluate cognitive concerns and determine other underlying cause of memory changes, including potential contribution from sleep, anxiety, attention, or depression.  Check B12, B1 EEG for transient alteration of awareness Continue to control mood as per psychiatry.   Recommend good control of cardiovascular risk factors [place] up in 2 months   Subjective:   The patient is here alone   How long did patient have memory difficulties? For the last 15 years "every concussion made it worse".  Reports some difficulty remembering new information, conversations and names.  When she was in graduate school she had to read a paragraph at least 15  times because she would forget quickly.  She cannot recall the names "I never did "..  Long-term memory is good. She reports issues with attention and concentration.  She had extensive neuropsych evaluation at [facility] about 12 years ago which was suspected of ADHD although not formally diagnosed.    repeats oneself?  Endorsed, a fair amount, especially over the last 10 years Disoriented when walking into a room?  Patient denies. However, "I was driving to [facility] and became disoriented and did not know where I was and then I look at my car I said "how do I unlock the car door even when looking at my key" .   Leaving objects in unusual places? Denies.   Wandering behavior?  Denies. Any personality changes?  Denies.   Any history of depression?:  Endorsed, with PTSD.  She is under the care of behavioral health.  She has a history of excessive worry and anxiety under better control lately. Hallucinations or paranoia?  Denies   Seizures?  She describes a history of seizure-like activity versus TD when she was on Reglan years ago.  In addition, "A couple of times I heard a train and then I blacked out ending on the floor, last episode 2021. Entire body contoured, I woke contracted. I then shook or 24 hours ". No blood or metallic taste. Did not get seen at the ED.     Any sleep changes?   Sleeps well.  "Until 2 years ago I had horrible insomnia but now is controlled ". Denies vivid dreams, REM behavior or sleepwalking at this time.  Sometimes she has nightmares and flashbacks from prior trauma, less often. Sleep apnea?  Denies   Any hygiene concerns?  Denies   Independent of bathing and dressing?  Endorsed  Does the patient needs help with medications? Patient is in charge   Who is in charge of the finances? Patient is in charge     Any changes  in appetite?  Denies     Patient have trouble swallowing? Denies.   Does the patient cook? Not much. Denies forgetting common recipes Any kitchen accidents such as leaving the stove on? Denies.   Any history of headaches? Endorsed. Had a history of migraines since age 57-4 until getting pregnant. Chronic pain ? Denies.   Ambulates with difficulty?  Denies. She is a runner for 40 years, runs daily. Recent falls or head injuries?  She has a history of multiple  concussions , two of them related to Ambien and the other one related to [place], and the rest were due to bad falls etc. During several of her concussions she had LOC.  Vision changes? Denies. Sometimes she sees "flashes for 5 mins and then they go away" Unilateral weakness, numbness or tingling? Denies.  Any tremors?   Denies.   Any anosmia?  Denies.   Any incontinence of urine? Stress incontinence, needs pantiliners  Any bowel dysfunction? "A lot of GI problems". Mostly diarrhea   Patient lives with her son, who has autism, recently finished college and moved History of heavy alcohol intake? Drinks 2 glasses beer or wine weekend History of heavy tobacco use? Denies. Quit 1 year ago Family history of dementia? Denies.  Does patient drive? Yes    Past Medical History:  Diagnosis Date   Anemia 15 YRS AGO, NONE RECENT   Arthritis    LOWER BACK AND HIPS   Broken foot    BOTH FEET LEFT FOOT CAST   Complication of anesthesia    Epinephrine"tachycardia"-dentist   Concussion with brief LOC    COPD (chronic obstructive pulmonary disease) (HCC)    MILD   Depression    GERD (gastroesophageal reflux disease)    Headache(784.0)    migraines   Hip pain    History of hiatal hernia    History of kidney stones    passed   Hypersomnolence disorder, subacute 09/09/2013   Sleepiness has increased over the last 6 month, she has been using Ambien for 20 years. Fears memory loss. Questions  About  Recent memory, loss related to [place].    Hypothyroidism    Memory loss HX OF WITH CONCUSSION   Menopause    Ovarian cyst    TIA (transient ischemic attack)    SUSPECTED ABOUT 11 YEARS AGO     Past Surgical History:  Procedure Laterality Date   ABDOMINAL SURGERY     x2(laparoscopic, 1 open), NO FALLOPIAN TUBES REMOVED WITH SURGERY   APPENDECTOMY     CESAREAN SECTION     X 1   COLON SURGERY  1986   "blockage"colectomy   COLONOSCOPY WITH PROPOFOL N/A 10/01/2013   Procedure: COLONOSCOPY WITH  PROPOFOL;  Surgeon: Charolett Bumpers, MD;  Location: [facility];  Service: Endoscopy;  Laterality: N/A;   ESOPHAGOGASTRODUODENOSCOPY (EGD) WITH PROPOFOL N/A 09/26/2016   Procedure: ESOPHAGOGASTRODUODENOSCOPY (EGD) WITH PROPOFOL;  Surgeon: Charolett Bumpers, MD;  Location: [facility];  Service: Endoscopy;  Laterality: N/A;   HEMORROIDECTOMY     HEMORROIDECTOMY     4x- last in Dec 2016   MOUTH SURGERY  December 19, 2013   implant   ORIF TOE FRACTURE Right 06/19/2014   Procedure: RIGHT FOOT OPEN REDUCTION INTERNAL FIXATION (ORIF) FIFTH METATARSAL ;  Surgeon: Loreta Ave, MD;  Location: [facility];  Service: Orthopedics;  Laterality: Right;     Allergies  Allergen Reactions   Penicillins Anaphylaxis    Has patient had a PCN reaction  causing immediate rash, facial/tongue/throat swelling, SOB or lightheadedness with hypotension: Yes Has patient had a PCN reaction causing severe rash involving mucus membranes or skin necrosis: No Has patient had a PCN reaction that required hospitalization: No Has patient had a PCN reaction occurring within the last 10 years: No If all of the above answers are "NO", then may proceed with Cephalosporin use.    Cheese Other (See Comments)    Headache   Chocolate Other (See Comments)    Headache    Lunesta [Eszopiclone]     Had an episode of memory loss after taking it   Other Other (See Comments)    MSG-Headache   Prednisone Swelling    NAUSEA, STOMACH ISSUES   Venlafaxine    Epinephrine Palpitations    Heart racing    Current Outpatient Medications  Medication Instructions   buPROPion (WELLBUTRIN XL) 150 MG 24 hr tablet TAKE 1 TABLET BY MOUTH( 150 MG) EVERY MORNING   Cholecalciferol (VITAMIN D-3) 125 MCG (5000 UT) TABS 1 tablet, Oral, Daily   dexmethylphenidate (FOCALIN XR) 20 mg, Oral, Daily   dexmethylphenidate (FOCALIN XR) 20 mg, Oral, Daily   dexmethylphenidate (FOCALIN XR) 20 mg, Oral, Daily   estradiol (ESTRACE) 0.5 mg,  Oral, Daily   Hyoscyamine Sulfate SL (LEVSIN/SL) 0.125 mg, Sublingual, Every 4 hours PRN, Up to 1.25 mg daily   levothyroxine (SYNTHROID) 88 mcg, Oral, Daily before breakfast   Multiple Vitamin (MULTIVITAMIN WITH MINERALS) TABS tablet 1 tablet, Oral, Daily   norethindrone-ethinyl estradiol (FEMHRT 1/5) 1-5 MG-MCG TABS tablet 1 tablet, Oral, Every evening   pantoprazole (PROTONIX) 40 mg, Oral, Every evening   propranolol (INDERAL) 10 mg, Oral, Daily   Vilazodone HCl (VIIBRYD) 40 mg, Oral, Daily     VITALS:   Vitals:   05/25/23 0747  BP: 107/74  Pulse: 85  Resp: 18  SpO2: 100%  Weight: 117 lb (53.1 kg)  Height: 5\' 3"  (1.6 m)      PHYSICAL EXAM   HEENT:  Normocephalic, atraumatic. The superficial temporal arteries are without ropiness or tenderness. Cardiovascular: Regular rate and rhythm. Lungs: Clear to auscultation bilaterally. Neck: There are no carotid bruits noted bilaterally.  NEUROLOGICAL:    05/25/2023   12:00 PM  Montreal Cognitive Assessment   Visuospatial/ Executive (0/5) 5  Naming (0/3) 3  Attention: Read list of digits (0/2) 2  Attention: Read list of letters (0/1) 1  Attention: Serial 7 subtraction starting at 100 (0/3) 3  Language: Repeat phrase (0/2) 2  Language : Fluency (0/1) 1  Abstraction (0/2) 0  Delayed Recall (0/5) 4  Orientation (0/6) 6  Total 27  Adjusted Score (based on education) 27        No data to display           Orientation:  Alert and oriented to person, place and time. No aphasia or dysarthria. Fund of knowledge is appropriate. Recent memory impaired  and remote memory intact.  Attention and concentration are normal.  Able to name objects and repeat phrases. Delayed recall 4 /5 Cranial nerves: There is good facial symmetry. Extraocular muscles are intact and visual fields are full to confrontational testing. Speech is fluent and clear. No tongue deviation. Hearing is intact to conversational tone. Tone: Tone is good  throughout. Sensation: Sensation is intact to light touch. Vibration is intact at the bilateral big toe.  Coordination: The patient has no difficulty with RAM's or FNF bilaterally. Normal finger to nose  Motor: Strength is 5/5 in the  bilateral upper and lower extremities. There is no pronator drift. There are no fasciculations noted. DTR's: Deep tendon reflexes are 2/4 bilaterally. Gait and Station: The patient is able to ambulate without difficulty The patient is able to heel toe walk . Gait is cautious and narrow. The patient is able to ambulate in a tandem fashion.       Thank you for allowing [place] the opportunity to participate in the care of this nice patient. Please do not hesitate to contact us for any questions or concerns.   Total time spent on today's visit was 64 minutes dedicated to this patient today, preparing to see patient, examining the patient, ordering tests and/or medications and counseling the patient, documenting clinical information in the EHR or other health record, independently interpreting results and communicating results to the patient/family, discussing treatment and goals, answering patient's questions and coordinating care.  Cc:  Thana Ates, MD  Marlowe Kays 05/25/2023 12:22 PM

## 2023-05-25 ENCOUNTER — Encounter: Payer: Self-pay | Admitting: Physician Assistant

## 2023-05-25 ENCOUNTER — Other Ambulatory Visit (INDEPENDENT_AMBULATORY_CARE_PROVIDER_SITE_OTHER): Payer: Managed Care, Other (non HMO)

## 2023-05-25 ENCOUNTER — Ambulatory Visit: Payer: Managed Care, Other (non HMO) | Admitting: Physician Assistant

## 2023-05-25 VITALS — BP 107/74 | HR 85 | Resp 18 | Ht 63.0 in | Wt 117.0 lb

## 2023-05-25 DIAGNOSIS — R413 Other amnesia: Secondary | ICD-10-CM | POA: Diagnosis not present

## 2023-05-25 DIAGNOSIS — R404 Transient alteration of awareness: Secondary | ICD-10-CM

## 2023-05-25 LAB — VITAMIN B12: Vitamin B-12: 440 pg/mL (ref 211–911)

## 2023-05-25 NOTE — Patient Instructions (Addendum)
It was a pleasure to see you today at our office.   Recommendations:  Neurocognitive evaluation for diagnostic clarity  MRI of the brain, the radiology office will call you to arrange you appointment  [phone] Check labs today B1, B12 [address] 1 hour EEG rule out seizure  Follow up in  2 months     For psychiatric meds, mood meds: Please have your primary care physician manage these medications.  If you have any severe symptoms of a stroke, or other severe issues such as confusion,severe chills or fever, etc call 911 or go to the ER as you may need to be evaluated further   For assessment of decision of mental capacity and competency:  Call Dr. Erick Blinks, geriatric psychiatrist at [phone]  Counseling regarding caregiver distress, including caregiver depression, anxiety and issues regarding community resources, adult day care programs, adult living facilities, or memory care questions:  please contact your  Primary Doctor's Social Worker   Whom to call: Memory  decline, memory medications: Call our office [phone]    [email or web address]   RECOMMENDATIONS FOR ALL PATIENTS WITH MEMORY PROBLEMS: 1. Continue to exercise (Recommend 30 minutes of walking everyday, or 3 hours every week) 2. Increase social interactions - continue going to [place] and enjoy social gatherings with friends and family 3. Eat healthy, avoid fried foods and eat more fruits and vegetables 4. Maintain adequate blood pressure, blood sugar, and blood cholesterol level. Reducing the risk of stroke and cardiovascular disease also helps promoting better memory. 5. Avoid stressful situations. Live a simple life and avoid aggravations. Organize your time and prepare for the next day in anticipation. 6. Sleep well, avoid any interruptions of sleep and avoid any distractions in the bedroom that may interfere with adequate sleep quality 7. Avoid sugar,  avoid sweets as there is a strong link between excessive sugar intake, diabetes, and cognitive impairment We discussed the Mediterranean diet, which has been shown to help patients reduce the risk of progressive memory disorders and reduces cardiovascular risk. This includes eating fish, eat fruits and green leafy vegetables, nuts like almonds and hazelnuts, walnuts, and also use olive oil. Avoid fast foods and fried foods as much as possible. Avoid sweets and sugar as sugar use has been linked to worsening of memory function.  There is always a concern of gradual progression of memory problems. If this is the case, then we may need to adjust level of care according to patient needs. Support, both to the patient and caregiver, should then be put into place.      You have been referred for a neuropsychological evaluation (i.e., evaluation of memory and thinking abilities). Please bring someone with you to this appointment if possible, as it is helpful for the doctor to hear from both you and another adult who knows you well. Please bring eyeglasses and hearing aids if you wear them.    The evaluation will take approximately 3 hours and has two parts:   The first part is a clinical interview with the neuropsychologist (Dr. Milbert Coulter or Dr. Roseanne Reno). During the interview, the neuropsychologist will speak with you and the individual you brought to the appointment.    The second part of the evaluation is testing with the doctor's technician Annabelle Harman or Selena Batten). During the testing, the technician will ask you to remember different types of material, solve problems, and answer some questionnaires. Your family member will not be present for this portion of the evaluation.   Please note: We  must reserve several hours of the neuropsychologist's time and the psychometrician's time for your evaluation appointment. As such, there is a No-Show fee of $100. If you are unable to attend any of your appointments, please contact  our office as soon as possible to reschedule.      DRIVING: Regarding driving, in patients with progressive memory problems, driving will be impaired. We advise to have someone else do the driving if trouble finding directions or if minor accidents are reported. Independent driving assessment is available to determine safety of driving.   If you are interested in the driving assessment, you can contact the following:  [facility] [employer] [facility] in [place] [phone]  [facility] [phone]  [facility] [phone]  [facility] [phone] or [phone]   FALL PRECAUTIONS: Be cautious when walking. Scan the area for obstacles that may increase the risk of trips and falls. When getting up in the mornings, sit up at the edge of the bed for a few minutes before getting out of bed. Consider elevating the bed at the head end to avoid drop of blood pressure when getting up. Walk always in a well-lit room (use night lights in the walls). Avoid area rugs or power cords from appliances in the middle of the walkways. Use a walker or a cane if necessary and consider physical therapy for balance exercise. Get your eyesight checked regularly.  FINANCIAL OVERSIGHT: Supervision, especially oversight when making financial decisions or transactions is also recommended.  HOME SAFETY: Consider the safety of the kitchen when operating appliances like stoves, microwave oven, and blender. Consider having supervision and share cooking responsibilities until no longer able to participate in those. Accidents with firearms and other hazards in the house should be identified and addressed as well.   ABILITY TO BE LEFT ALONE: If patient is unable to contact 911 operator, consider using LifeLine, or when the need is there, arrange for someone to stay with patients. Smoking is a fire hazard, consider supervision or cessation. Risk of wandering should be assessed by caregiver and  if detected at any point, supervision and safe proof recommendations should be instituted.  MEDICATION SUPERVISION: Inability to self-administer medication needs to be constantly addressed. Implement a mechanism to ensure safe administration of the medications.      Mediterranean Diet A Mediterranean diet refers to food and lifestyle choices that are based on the traditions of countries located on the [employer]. This way of eating has been shown to help prevent certain conditions and improve outcomes for people who have chronic diseases, like kidney disease and heart disease. What are tips for following this plan? Lifestyle  Cook and eat meals together with your family, when possible. Drink enough fluid to keep your urine clear or pale yellow. Be physically active every day. This includes: Aerobic exercise like running or swimming. Leisure activities like gardening, walking, or housework. Get 7-8 hours of sleep each night. If recommended by your health care provider, drink red wine in moderation. This means 1 glass a day for nonpregnant women and 2 glasses a day for men. A glass of wine equals 5 oz (150 mL). Reading food labels  Check the serving size of packaged foods. For foods such as rice and pasta, the serving size refers to the amount of cooked product, not dry. Check the total fat in packaged foods. Avoid foods that have saturated fat or trans fats. Check the ingredients list for added sugars, such as corn syrup. Shopping  At the  grocery store, buy most of your food from the areas near the walls of the store. This includes: Fresh fruits and vegetables (produce). Grains, beans, nuts, and seeds. Some of these may be available in unpackaged forms or large amounts (in bulk). Fresh seafood. Poultry and eggs. Low-fat dairy products. Buy whole ingredients instead of prepackaged foods. Buy fresh fruits and vegetables in-season from local farmers markets. Buy frozen fruits and  vegetables in resealable bags. If you do not have access to quality fresh seafood, buy precooked frozen shrimp or canned fish, such as tuna, salmon, or sardines. Buy small amounts of raw or cooked vegetables, salads, or olives from the deli or salad bar at your store. Stock your pantry so you always have certain foods on hand, such as olive oil, canned tuna, canned tomatoes, rice, pasta, and beans. Cooking  Cook foods with extra-virgin olive oil instead of using butter or other vegetable oils. Have meat as a side dish, and have vegetables or grains as your main dish. This means having meat in small portions or adding small amounts of meat to foods like pasta or stew. Use beans or vegetables instead of meat in common dishes like chili or lasagna. Experiment with different cooking methods. Try roasting or broiling vegetables instead of steaming or sauteing them. Add frozen vegetables to soups, stews, pasta, or rice. Add nuts or seeds for added healthy fat at each meal. You can add these to yogurt, salads, or vegetable dishes. Marinate fish or vegetables using olive oil, lemon juice, garlic, and fresh herbs. Meal planning  Plan to eat 1 vegetarian meal one day each week. Try to work up to 2 vegetarian meals, if possible. Eat seafood 2 or more times a week. Have healthy snacks readily available, such as: Vegetable sticks with hummus. Greek yogurt. Fruit and nut trail mix. Eat balanced meals throughout the week. This includes: Fruit: 2-3 servings a day Vegetables: 4-5 servings a day Low-fat dairy: 2 servings a day Fish, poultry, or lean meat: 1 serving a day Beans and legumes: 2 or more servings a week Nuts and seeds: 1-2 servings a day Whole grains: 6-8 servings a day Extra-virgin olive oil: 3-4 servings a day Limit red meat and sweets to only a few servings a month What are my food choices? Mediterranean diet Recommended Grains: Whole-grain pasta. Brown rice. Bulgar wheat. Polenta.  Couscous. Whole-wheat bread. Orpah Cobb. Vegetables: Artichokes. Beets. Broccoli. Cabbage. Carrots. Eggplant. Green beans. Chard. Kale. Spinach. Onions. Leeks. Peas. Squash. Tomatoes. Peppers. Radishes. Fruits: Apples. Apricots. Avocado. Berries. Bananas. Cherries. Dates. Figs. Grapes. Lemons. Melon. Oranges. Peaches. Plums. Pomegranate. Meats and other protein foods: Beans. Almonds. Sunflower seeds. Pine nuts. Peanuts. Cod. Salmon. Scallops. Shrimp. Tuna. Tilapia. Clams. Oysters. Eggs. Dairy: Low-fat milk. Cheese. Greek yogurt. Beverages: Water. Red wine. Herbal tea. Fats and oils: Extra virgin olive oil. Avocado oil. Grape seed oil. Sweets and desserts: [place] yogurt with honey. Baked apples. Poached pears. Trail mix. Seasoning and other foods: Basil. Cilantro. Coriander. Cumin. Mint. Parsley. Sage. Rosemary. Tarragon. Garlic. Oregano. Thyme. Pepper. Balsalmic vinegar. Tahini. Hummus. Tomato sauce. Olives. Mushrooms. Limit these Grains: Prepackaged pasta or rice dishes. Prepackaged cereal with added sugar. Vegetables: Deep fried potatoes (french fries). Fruits: Fruit canned in syrup. Meats and other protein foods: Beef. Pork. Lamb. Poultry with skin. Hot dogs. Tomasa Blase. Dairy: Ice cream. Sour cream. Whole milk. Beverages: Juice. Sugar-sweetened soft drinks. Beer. Liquor and spirits. Fats and oils: Butter. Canola oil. Vegetable oil. Beef fat (tallow). Lard. Sweets and desserts: Cookies. Cakes. Pies.  Candy. Seasoning and other foods: Mayonnaise. Premade sauces and marinades. The items listed may not be a complete list. Talk with your dietitian about what dietary choices are right for you. Summary The Mediterranean diet includes both food and lifestyle choices. Eat a variety of fresh fruits and vegetables, beans, nuts, seeds, and whole grains. Limit the amount of red meat and sweets that you eat. Talk with your health care provider about whether it is safe for you to drink red wine in  moderation. This means 1 glass a day for nonpregnant women and 2 glasses a day for men. A glass of wine equals 5 oz (150 mL). This information is not intended to replace advice given to you by your health care provider. Make sure you discuss any questions you have with your health care provider. Document Released: 02/18/2016 Document Revised: 03/22/2016 Document Reviewed: 02/18/2016 Elsevier Interactive Patient Education  2017 [employer].

## 2023-05-25 NOTE — Progress Notes (Signed)
B12 is in the low normal, at 440 (we like it between 401,000), recommend starting B12 supplements at 1000 mcg daily, and follow-up with PCP thank you

## 2023-05-29 LAB — VITAMIN B1: Vitamin B1 (Thiamine): 23 nmol/L (ref 8–30)

## 2023-05-30 NOTE — Progress Notes (Signed)
B1 is normal, thanks

## 2023-06-22 ENCOUNTER — Encounter: Payer: Self-pay | Admitting: Physician Assistant

## 2023-06-29 ENCOUNTER — Ambulatory Visit
Admission: RE | Admit: 2023-06-29 | Discharge: 2023-06-29 | Disposition: A | Discharge status: Home / Self Care | Payer: Commercial Managed Care - HMO | Source: Ambulatory Visit | Attending: Physician Assistant | Admitting: Physician Assistant

## 2023-06-29 MED ORDER — GADOPICLENOL 0.5 MMOL/ML IV SOLN: 6.0000 mL | Freq: Once | INTRAVENOUS | Status: DC | PRN

## 2023-06-29 MED ORDER — GADOPICLENOL 0.5 MMOL/ML IV SOLN
6.0000 mL | Freq: Once | INTRAVENOUS | Status: AC | PRN
  Administered 2023-06-29: 6 mL via INTRAVENOUS

## 2023-07-02 NOTE — Progress Notes (Signed)
The MRI of the brain is essentially unremarkable, just shows  showed  chronic mild age related changes. It did not show any tumors, stroke or bleed.Thanks

## 2023-07-17 ENCOUNTER — Telehealth (HOSPITAL_BASED_OUTPATIENT_CLINIC_OR_DEPARTMENT_OTHER): Payer: Medicaid Other | Admitting: Psychiatry

## 2023-07-17 ENCOUNTER — Encounter (HOSPITAL_COMMUNITY): Payer: Self-pay | Admitting: Psychiatry

## 2023-07-17 VITALS — Wt 117.0 lb

## 2023-07-17 DIAGNOSIS — F332 Major depressive disorder, recurrent severe without psychotic features: Secondary | ICD-10-CM | POA: Diagnosis not present

## 2023-07-17 DIAGNOSIS — F411 Generalized anxiety disorder: Secondary | ICD-10-CM | POA: Diagnosis not present

## 2023-07-17 DIAGNOSIS — F4310 Post-traumatic stress disorder, unspecified: Secondary | ICD-10-CM

## 2023-07-17 MED ORDER — PROPRANOLOL HCL 10 MG PO TABS: 10.0000 mg | ORAL_TABLET | Freq: Two times a day (BID) | ORAL | 0 refills | Status: DC

## 2023-07-17 MED ORDER — BUPROPION HCL ER (XL) 150 MG PO TB24: ORAL_TABLET | ORAL | 0 refills | Status: DC

## 2023-07-17 MED ORDER — VILAZODONE HCL 40 MG PO TABS
40.0000 mg | ORAL_TABLET | Freq: Every day | ORAL | 0 refills | Status: DC
Start: 2023-07-17 — End: 2023-08-24

## 2023-07-17 NOTE — Progress Notes (Signed)
 Monica Jensen Virtual Progress Note   Patient Location: Home Provider Location: Home Office  I connect with patient by telephone and verified that I am speaking with correct person by using two identifiers. I discussed the limitations of evaluation and management by telemedicine and the availability of in person appointments. I also discussed with the patient that there may be a patient responsible charge related to this service. The patient expressed understanding and agreed to proceed.  Monica Jensen [id number] 58 y.o.  07/17/2023 8:49 AM  History of Present Illness:  Patient is a 58 year old Caucasian, employed divorced female who was seen first time 3 months ago.  Patient was seeing Dr. Brutus who had left the practice.  Patient wake up late today and could not do video session.  Patient reported taking Wellbutrin , Viibryd  and Inderal  every day.  She reported last month was very difficult.  She had a lot of anxiety, nightmares and flashbacks.  She is in the process of changing her job.  She is going to start work at a new place starting next Monday.  She was not happy in her present job.  She also reported it has been 3 years since her sister died and Christmas is difficult for the family.  She admitted had a difficult time during the holidays because she is trying to be for her 67 year old son who has birthday 3 days after Christmas.  Patient's son diagnosed with autism spectrum disorder.  She admitted not too much time to see her boyfriend but it has been very supportive.  She struggled with attention, forgetfulness and we have recommended for neuropsych testing and to see neurology.  She had an MRI and she is going to have appointment with a neurologist in few days to discuss about the results.  She had not had neuro cognitive testing yet but her plan is to do it.  She is in therapy with [name].  She is sleeping 6 to 7 hours but sometimes she struggled with nightmares  and flashback.  She admitted more anxious, nervous, isolated lately but hoping it is situational.  Her appetite is okay.  Her weight is stable.  Neurologist had started her on B12 and she feel it had helped some of her symptoms with forgetfulness.  Patient is working as a paramedic.  She has a 74 year old son who live close by.  She started a new relationship with a regular God and that has been very helpful.  Patient denies drinking or using any illegal substances.  Past Psychiatric History: H/O at least 7-8 inpatient mostly for trauma treatment. No history of suicidal attempt. Saw a psychologist to help the trauma symptoms. Saw Dr. Carole and then multiple provider at [facility] behavioral health outpatient. Had neurocognitive testing 2 times but negative for ADHD. History of concussion, DUI in December 2022. History of inpatient in 2011 at [facility]. Had tried Celexa , lithium , Ambien, Lunesta , hydroxyzine , Lamictal , Vyvanse , Ativan , Latuda , mirtazapine , Seroquel , Xanax . History of TMS treatment in 2021 at [facility]. She finished 36 treatment.    Outpatient Encounter Medications as of 07/17/2023  Medication Sig   buPROPion  (WELLBUTRIN  XL) 150 MG 24 hr tablet TAKE 1 TABLET BY MOUTH( 150 MG) EVERY MORNING   Cholecalciferol (VITAMIN D-3) 125 MCG (5000 UT) TABS Take 1 tablet by mouth daily in the afternoon.   dexmethylphenidate  (FOCALIN  XR) 20 MG 24 hr capsule Take 1 capsule (20 mg total) by mouth daily.   dexmethylphenidate  (FOCALIN  XR) 20 MG 24  hr capsule Take 1 capsule (20 mg total) by mouth daily.   dexmethylphenidate  (FOCALIN  XR) 20 MG 24 hr capsule Take 1 capsule (20 mg total) by mouth daily.   estradiol  (ESTRACE ) 0.5 MG tablet Take 0.5 mg by mouth daily.   Hyoscyamine  Sulfate SL (LEVSIN /SL) 0.125 MG SUBL Place 1 tablet (0.125 mg total) under the tongue every 4 (four) hours as needed (pain). Up to 1.25 mg daily (Patient not taking: Reported on 11/17/2022)   levothyroxine  (SYNTHROID ) 88 MCG  tablet Take 88 mcg by mouth daily before breakfast.   Multiple Vitamin (MULTIVITAMIN WITH MINERALS) TABS tablet Take 1 tablet by mouth daily.   norethindrone -ethinyl estradiol  (FEMHRT 1/5) 1-5 MG-MCG TABS tablet Take 1 tablet by mouth every evening.   pantoprazole  (PROTONIX ) 40 MG tablet Take 40 mg by mouth every evening.    propranolol  (INDERAL ) 10 MG tablet Take 1 tablet (10 mg total) by mouth daily.   Vilazodone  HCl (VIIBRYD ) 40 MG TABS Take 1 tablet (40 mg total) by mouth daily.   No facility-administered encounter medications on file as of 07/17/2023.    Recent Results (from the past 2160 hours)  Vitamin B1     Status: None   Collection Time: 05/25/23  9:16 AM  Result Value Ref Range   Vitamin B1 (Thiamine ) 23 8 - 30 nmol/L    Comment: (Note) Vitamin supplementation within 24 hours prior to blood  draw may affect the accuracy of the results. . This test was developed and its analytical performance  characteristics have been determined by [employer]. It has not been cleared or approved by FDA.  This assay has been validated pursuant to the CLIA  regulations and is used for clinical purposes. . [facility] [address] [place] [address] [phone] Johanna Agent L. Gino, MD, PhD   Vitamin B12     Status: None   Collection Time: 05/25/23  9:27 AM  Result Value Ref Range   Vitamin B-12 440 211 - 911 pg/mL     Psychiatric Specialty Exam: Physical Exam  Review of Systems  Weight 117 lb (53.1 kg), last menstrual period 06/03/2014.There is no height or weight on file to calculate BMI.  General Appearance: NA  Eye Contact:  NA  Speech:  Slow  Volume:  Decreased  Mood:  Anxious and Depressed  Affect:  NA  Thought Process:  Descriptions of Associations: Intact  Orientation:  Full (Time, Place, and Person)  Thought Content:  Rumination  Suicidal Thoughts:  No  Homicidal Thoughts:  No  Memory:  Immediate;   Good Recent;    Good Remote;   Fair  Judgement:  Intact  Insight:  Present  Psychomotor Activity:  Decreased  Concentration:  Concentration: Fair and Attention Span: Fair  Recall:  Good  Fund of Knowledge:  Good  Language:  Good  Akathisia:  No  Handed:  Right  AIMS (if indicated):     Assets:  Communication Skills Desire for Improvement Housing Social Support Talents/Skills  ADL's:  Intact  Cognition:  WNL  Sleep:  6-7 hours     Assessment/Plan: Severe episode of recurrent major depressive disorder, without psychotic features (HCC) - Plan: buPROPion  (WELLBUTRIN  XL) 150 MG 24 hr tablet, Vilazodone  HCl (VIIBRYD ) 40 MG TABS  GAD (generalized anxiety disorder) - Plan: propranolol  (INDERAL ) 10 MG tablet, Vilazodone  HCl (VIIBRYD ) 40 MG TABS  PTSD (post-traumatic stress disorder) - Plan: Vilazodone  HCl (VIIBRYD ) 40 MG TABS  Discussed recent anxiety, nervousness and worsening  of PTSD symptoms.  Reviewed notes from neurology.  Patient has appointment coming up to discuss our MRI results in few days.  She also in the process of getting neurocognitive testing to help her attention, forgetfulness.  I recommend considering increasing the propranolol  10 mg twice a day.  Currently she is taking 10 mg daily.  I explained it is a blood pressure medicine and she need to check her blood pressure every time she takes the propranolol .  She agreed with the plan.  Continue Viibryd  40 mg daily and Wellbutrin  XL 150 mg daily.  Encouraged to keep the appointment with her therapist.  Patient is hoping once she start the new job her symptoms will get better.  Recommend to call us  back if she is any question or any concern.  Encouraged to keep the appointment with neurology, taking B12 helping.  We will follow-up in 3 months.   Follow Up Instructions:     I discussed the assessment and treatment plan with the patient. The patient was provided an opportunity to ask questions and all were answered. The patient agreed with the  plan and demonstrated an understanding of the instructions.   The patient was advised to call back or seek an in-person evaluation if the symptoms worsen or if the condition fails to improve as anticipated.    Collaboration of Care: Other provider involved in patient's care AEB notes are available in epic to review  Patient/Guardian was advised Release of Information must be obtained prior to any record release in order to collaborate their care with an outside provider. Patient/Guardian was advised if they have not already done so to contact the registration department to sign all necessary forms in order for us  to release information regarding their care.   Consent: Patient/Guardian gives verbal consent for treatment and assignment of benefits for services provided during this visit. Patient/Guardian expressed understanding and agreed to proceed.     I provided 32 minutes of non face to face time during this encounter.  Note: This document was prepared by [employer] voice dictation technology and any errors that results from this process are unintentional.    Leni ONEIDA Client, Jensen 07/17/2023

## 2023-07-18 NOTE — Progress Notes (Signed)
 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 These were personally reviewed, remarkable for very mild chronic small vessel ischemia, with mild age related cerebral and cerebellar atrophy. EEG was normal, no seizure activity was noted. She was last seen on 06/04/2017 for with a MoCA of 27/30.  Patient is here alone. Previous records as well as any outside records available were reviewed prior to todays visit. Etiology of the memory concerns is unclear. Discussed the role of neuropsych testing for diagnostic clarity. Pending on the results, if neurodegenerative disease is suspected, will proceed with further testing.  Patient is able to participate on his ADLs and to drive without difficulties. She is about to start a new job soon. Mood is followed by psych and counseling.        Follow-up  if neuropsych evaluation yields a diagnosis of neurodegenerative disease requiring treatment Will schedule neuropsychological evaluation for clarity of the diagnosis to determine other causes of memory concerns at an outside facility given her age (under 29), concern for ADD Will hold plans for antidementia meds for now unless indicated after neuropsych testing. Continue B12 supplements Follow-up with psychiatry Continue to control mood as per psych and counseling Recommend good control of cardiovascular risk factors   Initial visit 06/05/2023   How long did patient have memory difficulties? For the last 15 years every concussion made it worse.  Reports some difficulty remembering new information, conversations and names.  When she was in graduate school she had to  read a paragraph at least 15  times because she would forget quickly.  She cannot recall the names I never did .[name]  Long-term memory is good. She reports issues with attention and concentration.  She had extensive neuropsych evaluation at [facility] about 12 years ago which was suspected of ADHD although not formally diagnosed.   repeats oneself?  Endorsed, a fair amount, especially over the last 10 years Disoriented when walking into a room?  Patient denies. However, I was driving to [facility] and became disoriented and did not know where I was and then I look at my car I said how do I unlock the car door even when looking at my key .   Leaving objects in unusual places? Denies.   Wandering behavior?  Denies. Any personality changes?  Denies.   Any history of depression?:  Endorsed, with PTSD.  She is under the care of behavioral health.  She has a history of excessive worry and anxiety under better control lately. Hallucinations or paranoia?  Denies   Seizures?  She describes a history of seizure-like activity versus TD when she was on Reglan years ago.  In addition, A couple of times I heard a train and then I blacked out ending on the floor, last episode 2021. Entire body contoured, I woke contracted. I then shook or 24 hours . No blood or metallic taste. Did not get seen at the ED.     Any sleep changes?   Sleeps well.  Until 2 years ago I had horrible insomnia but now is controlled . Denies vivid dreams, REM behavior or sleepwalking at this time.  Sometimes she has nightmares and  flashbacks from prior trauma, less often. Sleep apnea?  Denies   Any hygiene concerns?  Denies   Independent of bathing and dressing?  Endorsed  Does the patient needs help with medications? Patient is in charge   Who is in charge of the finances? Patient is in charge     Any changes in appetite?  Denies     Patient have trouble swallowing? Denies.   Does the patient cook? Not much. Denies forgetting  common recipes Any kitchen accidents such as leaving the stove on? Denies.   Any history of headaches? Endorsed. Had a history of migraines since age 58-4 until getting pregnant. Chronic pain ? Denies.   Ambulates with difficulty?  Denies. She is a runner for 40 years, runs daily. Recent falls or head injuries?  She has a history of multiple concussions , two of them related to Ambien and the other one related to Lunesta , and the rest were due to bad falls etc. During several of her concussions she had LOC.  Vision changes? Denies. Sometimes she sees flashes for 5 mins and then they go away Unilateral weakness, numbness or tingling? Denies.  Any tremors?   Denies.   Any anosmia?  Denies.   Any incontinence of urine? Stress incontinence, needs pantiliners  Any bowel dysfunction? A lot of GI problems. Mostly diarrhea   Patient lives with her son, who has autism, recently finished college and moved History of heavy alcohol intake? Drinks 2 glasses beer or wine weekend History of heavy tobacco use? Denies. Quit 1 year ago Family history of dementia? Denies.  Does patient drive? Yes    CURRENT MEDICATIONS:  Outpatient Encounter Medications as of 07/19/2023  Medication Sig   buPROPion  (WELLBUTRIN  XL) 150 MG 24 hr tablet TAKE 1 TABLET BY MOUTH( 150 MG) EVERY MORNING   Cholecalciferol (VITAMIN D-3) 125 MCG (5000 UT) TABS Take 1 tablet by mouth daily in the afternoon.   dexmethylphenidate  (FOCALIN  XR) 20 MG 24 hr capsule Take 1 capsule (20 mg total) by mouth daily.   dexmethylphenidate  (FOCALIN  XR) 20 MG 24 hr capsule Take 1 capsule (20 mg total) by mouth daily.   dexmethylphenidate  (FOCALIN  XR) 20 MG 24 hr capsule Take 1 capsule (20 mg total) by mouth daily.   estradiol  (ESTRACE ) 0.5 MG tablet Take 0.5 mg by mouth daily.   Hyoscyamine  Sulfate SL (LEVSIN /SL) 0.125 MG SUBL Place 1 tablet (0.125 mg total) under the tongue every 4 (four) hours as needed (pain). Up to 1.25 mg daily (Patient not  taking: Reported on 11/17/2022)   levothyroxine  (SYNTHROID ) 88 MCG tablet Take 88 mcg by mouth daily before breakfast.   Multiple Vitamin (MULTIVITAMIN WITH MINERALS) TABS tablet Take 1 tablet by mouth daily.   norethindrone -ethinyl estradiol  (FEMHRT 1/5) 1-5 MG-MCG TABS tablet Take 1 tablet by mouth every evening.   pantoprazole  (PROTONIX ) 40 MG tablet Take 40 mg by mouth every evening.    propranolol  (INDERAL ) 10 MG tablet Take 1 tablet (10 mg total) by mouth 2 (two) times daily.   Vilazodone  HCl (VIIBRYD ) 40 MG TABS Take 1 tablet (40 mg total) by mouth daily.   No facility-administered encounter medications on file as of 07/19/2023.        No data to display            06/21/2023   11:00 AM 05/25/2023   12:00 PM  Montreal Cognitive Assessment   Visuospatial/ Executive (0/5) 4 5  Naming (0/3) 3 3  Attention: Read list of digits (  0/2) 2 2  Attention: Read list of letters (0/1) 1 1  Attention: Serial 7 subtraction starting at 100 (0/3) 3 3  Language: Repeat phrase (0/2) 2 2  Language : Fluency (0/1) 1 1  Abstraction (0/2) 0 0  Delayed Recall (0/5) 4 4  Orientation (0/6) 6 6  Total 26 27  Adjusted Score (based on education) 26 27   Thank you for allowing us  the opportunity to participate in the care of this nice patient. Please do not hesitate to contact us  for any questions or concerns.   Total time spent on today's visit was 40 minutes dedicated to this patient today, preparing to see patient, examining the patient, ordering tests and/or medications and counseling the patient, documenting clinical information in the EHR or other health record, independently interpreting results and communicating results to the patient/family, discussing treatment and goals, answering patient's questions and coordinating care.  Cc:  Dwight Trula SQUIBB, MD  Camie Sevin 07/18/2023 7:07 AM

## 2023-07-19 ENCOUNTER — Ambulatory Visit (INDEPENDENT_AMBULATORY_CARE_PROVIDER_SITE_OTHER): Payer: Medicaid Other | Admitting: Physician Assistant

## 2023-07-19 ENCOUNTER — Encounter: Payer: Self-pay | Admitting: Physician Assistant

## 2023-07-19 VITALS — BP 122/87 | HR 75 | Resp 20 | Ht 63.0 in

## 2023-07-19 DIAGNOSIS — R413 Other amnesia: Secondary | ICD-10-CM | POA: Insufficient documentation

## 2023-07-19 NOTE — Patient Instructions (Addendum)
 It was a pleasure to see you today at our office.   Recommendations:  Neurocognitive evaluation for diagnostic clarity  Follow up  pending on the Neuropsych evaluation [facility] [phone]      [facility] of medicine from the [employer] of health) has a materials engineer and public funded clinical studies conducted around the world.  Here is the link:  rankchecks.se   For psychiatric meds, mood meds: Please have your primary care physician manage these medications.  If you have any severe symptoms of a stroke, or other severe issues such as confusion,severe chills or fever, etc call 911 or go to the ER as you may need to be evaluated further   For assessment of decision of mental capacity and competency:  Call Dr. Rosaline Nine, geriatric psychiatrist at [phone]  Counseling regarding caregiver distress, including caregiver depression, anxiety and issues regarding community resources, adult day care programs, adult living facilities, or memory care questions:  please contact your  Primary Doctor's Social Worker   Whom to call: Memory  decline, memory medications: Call our office [phone]    [email or web address]   RECOMMENDATIONS FOR ALL PATIENTS WITH MEMORY PROBLEMS: 1. Continue to exercise (Recommend 30 minutes of walking everyday, or 3 hours every week) 2. Increase social interactions - continue going to [place] and enjoy social gatherings with friends and family 3. Eat healthy, avoid fried foods and eat more fruits and vegetables 4. Maintain adequate blood pressure, blood sugar, and blood cholesterol level. Reducing the risk of stroke and cardiovascular disease also helps promoting better memory. 5. Avoid stressful situations. Live a simple life and avoid aggravations. Organize your time and prepare for the next day in anticipation. 6. Sleep well, avoid any  interruptions of sleep and avoid any distractions in the bedroom that may interfere with adequate sleep quality 7. Avoid sugar, avoid sweets as there is a strong link between excessive sugar intake, diabetes, and cognitive impairment We discussed the Mediterranean diet, which has been shown to help patients reduce the risk of progressive memory disorders and reduces cardiovascular risk. This includes eating fish, eat fruits and green leafy vegetables, nuts like almonds and hazelnuts, walnuts, and also use olive oil. Avoid fast foods and fried foods as much as possible. Avoid sweets and sugar as sugar use has been linked to worsening of memory function.  There is always a concern of gradual progression of memory problems. If this is the case, then we may need to adjust level of care according to patient needs. Support, both to the patient and caregiver, should then be put into place.      You have been referred for a neuropsychological evaluation (i.e., evaluation of memory and thinking abilities). Please bring someone with you to this appointment if possible, as it is helpful for the doctor to hear from both you and another adult who knows you well. Please bring eyeglasses and hearing aids if you wear them.    The evaluation will take approximately 3 hours and has two parts:   The first part is a clinical interview with the neuropsychologist (Dr. Richie or Dr. Jackquline). During the interview, the neuropsychologist will speak with you and the individual you brought to the appointment.    The second part of the evaluation is testing with the doctor's technician Neal or Luke). During the testing, the technician will ask you to remember different types of material, solve problems, and answer some questionnaires. Your family member will not be present  for this portion of the evaluation.   Please note: We must reserve several hours of the neuropsychologist's time and the psychometrician's time for your  evaluation appointment. As such, there is a No-Show fee of $100. If you are unable to attend any of your appointments, please contact our office as soon as possible to reschedule.      DRIVING: Regarding driving, in patients with progressive memory problems, driving will be impaired. We advise to have someone else do the driving if trouble finding directions or if minor accidents are reported. Independent driving assessment is available to determine safety of driving.   If you are interested in the driving assessment, you can contact the following:  [employer] in [place] [phone]  [facility] [phone]  [facility] [phone]  [facility] [phone] or [phone]   FALL PRECAUTIONS: Be cautious when walking. Scan the area for obstacles that may increase the risk of trips and falls. When getting up in the mornings, sit up at the edge of the bed for a few minutes before getting out of bed. Consider elevating the bed at the head end to avoid drop of blood pressure when getting up. Walk always in a well-lit room (use night lights in the walls). Avoid area rugs or power cords from appliances in the middle of the walkways. Use a walker or a cane if necessary and consider physical therapy for balance exercise. Get your eyesight checked regularly.  FINANCIAL OVERSIGHT: Supervision, especially oversight when making financial decisions or transactions is also recommended.  HOME SAFETY: Consider the safety of the kitchen when operating appliances like stoves, microwave oven, and blender. Consider having supervision and share cooking responsibilities until no longer able to participate in those. Accidents with firearms and other hazards in the house should be identified and addressed as well.   ABILITY TO BE LEFT ALONE: If patient is unable to contact 911 operator, consider using LifeLine, or when the need is there, arrange for someone to  stay with patients. Smoking is a fire hazard, consider supervision or cessation. Risk of wandering should be assessed by caregiver and if detected at any point, supervision and safe proof recommendations should be instituted.  MEDICATION SUPERVISION: Inability to self-administer medication needs to be constantly addressed. Implement a mechanism to ensure safe administration of the medications.      Mediterranean Diet A Mediterranean diet refers to food and lifestyle choices that are based on the traditions of countries located on the [employer]. This way of eating has been shown to help prevent certain conditions and improve outcomes for people who have chronic diseases, like kidney disease and heart disease. What are tips for following this plan? Lifestyle  Cook and eat meals together with your family, when possible. Drink enough fluid to keep your urine clear or pale yellow. Be physically active every day. This includes: Aerobic exercise like running or swimming. Leisure activities like gardening, walking, or housework. Get 7-8 hours of sleep each night. If recommended by your health care provider, drink red wine in moderation. This means 1 glass a day for nonpregnant women and 2 glasses a day for men. A glass of wine equals 5 oz (150 mL). Reading food labels  Check the serving size of packaged foods. For foods such as rice and pasta, the serving size refers to the amount of cooked product, not dry. Check the total fat in packaged foods. Avoid foods that have saturated fat or trans fats. Check the ingredients list  for added sugars, such as corn syrup. Shopping  At the grocery store, buy most of your food from the areas near the walls of the store. This includes: Fresh fruits and vegetables (produce). Grains, beans, nuts, and seeds. Some of these may be available in unpackaged forms or large amounts (in bulk). Fresh seafood. Poultry and eggs. Low-fat dairy products. Buy whole  ingredients instead of prepackaged foods. Buy fresh fruits and vegetables in-season from local farmers markets. Buy frozen fruits and vegetables in resealable bags. If you do not have access to quality fresh seafood, buy precooked frozen shrimp or canned fish, such as tuna, salmon, or sardines. Buy small amounts of raw or cooked vegetables, salads, or olives from the deli or salad bar at your store. Stock your pantry so you always have certain foods on hand, such as olive oil, canned tuna, canned tomatoes, rice, pasta, and beans. Cooking  Cook foods with extra-virgin olive oil instead of using butter or other vegetable oils. Have meat as a side dish, and have vegetables or grains as your main dish. This means having meat in small portions or adding small amounts of meat to foods like pasta or stew. Use beans or vegetables instead of meat in common dishes like chili or lasagna. Experiment with different cooking methods. Try roasting or broiling vegetables instead of steaming or sauteing them. Add frozen vegetables to soups, stews, pasta, or rice. Add nuts or seeds for added healthy fat at each meal. You can add these to yogurt, salads, or vegetable dishes. Marinate fish or vegetables using olive oil, lemon juice, garlic, and fresh herbs. Meal planning  Plan to eat 1 vegetarian meal one day each week. Try to work up to 2 vegetarian meals, if possible. Eat seafood 2 or more times a week. Have healthy snacks readily available, such as: Vegetable sticks with hummus. Greek yogurt. Fruit and nut trail mix. Eat balanced meals throughout the week. This includes: Fruit: 2-3 servings a day Vegetables: 4-5 servings a day Low-fat dairy: 2 servings a day Fish, poultry, or lean meat: 1 serving a day Beans and legumes: 2 or more servings a week Nuts and seeds: 1-2 servings a day Whole grains: 6-8 servings a day Extra-virgin olive oil: 3-4 servings a day Limit red meat and sweets to only a few servings  a month What are my food choices? Mediterranean diet Recommended Grains: Whole-grain pasta. Brown rice. Bulgar wheat. Polenta. Couscous. Whole-wheat bread. [place] Made[place]. Vegetables: Artichokes. Beets. Broccoli. Cabbage. Carrots. Eggplant. Green beans. Chard. Kale. Spinach. Onions. Leeks. Peas. Squash. Tomatoes. Peppers. Radishes. Fruits: Apples. Apricots. Avocado. Berries. Bananas. Cherries. Dates. Figs. Grapes. Lemons. Melon. Oranges. Peaches. Plums. Pomegranate. Meats and other protein foods: Beans. Almonds. Sunflower seeds. Pine nuts. Peanuts. Cod. Salmon. Scallops. Shrimp. Tuna. Tilapia. Clams. Oysters. Eggs. Dairy: Low-fat milk. Cheese. Greek yogurt. Beverages: Water. Red wine. Herbal tea. Fats and oils: Extra virgin olive oil. Avocado oil. Grape seed oil. Sweets and desserts: Greek yogurt with honey. Baked apples. Poached pears. Trail mix. Seasoning and other foods: Basil. Cilantro. Coriander. Cumin. Mint. Parsley. Sage. Rosemary. Tarragon. Garlic. Oregano. Thyme. Pepper. Balsalmic vinegar. Tahini. Hummus. Tomato sauce. Olives. Mushrooms. Limit these Grains: Prepackaged pasta or rice dishes. Prepackaged cereal with added sugar. Vegetables: Deep fried potatoes (french fries). Fruits: Fruit canned in syrup. Meats and other protein foods: Beef. Pork. Lamb. Poultry with skin. Hot dogs. [place]. Dairy: Ice cream. Sour cream. Whole milk. Beverages: Juice. Sugar-sweetened soft drinks. Beer. Liquor and spirits. Fats and oils: Butter. Canola oil. Vegetable  oil. Beef fat (tallow). Lard. Sweets and desserts: Cookies. Cakes. Pies. Candy. Seasoning and other foods: Mayonnaise. Premade sauces and marinades. The items listed may not be a complete list. Talk with your dietitian about what dietary choices are right for you. Summary The Mediterranean diet includes both food and lifestyle choices. Eat a variety of fresh fruits and vegetables, beans, nuts, seeds, and whole grains. Limit the amount of  red meat and sweets that you eat. Talk with your health care provider about whether it is safe for you to drink red wine in moderation. This means 1 glass a day for nonpregnant women and 2 glasses a day for men. A glass of wine equals 5 oz (150 mL). This information is not intended to replace advice given to you by your health care provider. Make sure you discuss any questions you have with your health care provider. Document Released: 02/18/2016 Document Revised: 03/22/2016 Document Reviewed: 02/18/2016 Elsevier Interactive Patient Education  2017 [employer].

## 2023-07-24 ENCOUNTER — Telehealth: Payer: Self-pay

## 2023-07-24 NOTE — Telephone Encounter (Signed)
 I contacted [facility].[phone]. Fax [phone]. They did receive the referral two attempts have been made by there office to schedule.FYI

## 2023-08-24 ENCOUNTER — Other Ambulatory Visit (HOSPITAL_COMMUNITY): Payer: Self-pay | Admitting: *Deleted

## 2023-08-24 DIAGNOSIS — F411 Generalized anxiety disorder: Secondary | ICD-10-CM

## 2023-08-24 DIAGNOSIS — F4310 Post-traumatic stress disorder, unspecified: Secondary | ICD-10-CM

## 2023-08-24 DIAGNOSIS — F332 Major depressive disorder, recurrent severe without psychotic features: Secondary | ICD-10-CM

## 2023-08-24 MED ORDER — VILAZODONE HCL 40 MG PO TABS
40.0000 mg | ORAL_TABLET | Freq: Every day | ORAL | 0 refills | Status: DC
Start: 2023-08-24 — End: 2023-10-16

## 2023-09-01 ENCOUNTER — Other Ambulatory Visit: Payer: Self-pay | Admitting: Gastroenterology

## 2023-09-01 DIAGNOSIS — K862 Cyst of pancreas: Secondary | ICD-10-CM

## 2023-09-22 ENCOUNTER — Encounter (HOSPITAL_COMMUNITY): Payer: Self-pay

## 2023-10-16 ENCOUNTER — Encounter (HOSPITAL_COMMUNITY): Payer: Self-pay | Admitting: Psychiatry

## 2023-10-16 ENCOUNTER — Telehealth (HOSPITAL_BASED_OUTPATIENT_CLINIC_OR_DEPARTMENT_OTHER): Admitting: Psychiatry

## 2023-10-16 ENCOUNTER — Telehealth (HOSPITAL_COMMUNITY): Payer: Self-pay | Admitting: *Deleted

## 2023-10-16 DIAGNOSIS — F4310 Post-traumatic stress disorder, unspecified: Secondary | ICD-10-CM | POA: Diagnosis not present

## 2023-10-16 DIAGNOSIS — F411 Generalized anxiety disorder: Secondary | ICD-10-CM | POA: Diagnosis not present

## 2023-10-16 DIAGNOSIS — F332 Major depressive disorder, recurrent severe without psychotic features: Secondary | ICD-10-CM | POA: Diagnosis not present

## 2023-10-16 MED ORDER — VILAZODONE HCL 40 MG PO TABS
40.0000 mg | ORAL_TABLET | Freq: Every day | ORAL | 0 refills | Status: DC
Start: 2023-10-16 — End: 2024-01-31

## 2023-10-16 MED ORDER — PROPRANOLOL HCL 10 MG PO TABS
10.0000 mg | ORAL_TABLET | Freq: Two times a day (BID) | ORAL | 0 refills | Status: DC
Start: 2023-10-16 — End: 2024-01-31

## 2023-10-16 MED ORDER — BUPROPION HCL ER (XL) 150 MG PO TB24: ORAL_TABLET | ORAL | 0 refills | Status: DC
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
## 2023-10-16 NOTE — Telephone Encounter (Signed)
 Referrals for ADHD testing sent to [facility] and [facility].

## 2023-10-20 ENCOUNTER — Telehealth: Payer: Self-pay | Admitting: Physician Assistant

## 2023-10-20 NOTE — Telephone Encounter (Signed)
 Agree with the referral if they take her, will be ok with me.

## 2023-10-20 NOTE — Telephone Encounter (Signed)
 Pt called in stating [facility] does not take her insurance. She found a place that does. It is [facility]. They will need a referral sent to them. She wants to make sure ADHD testing is on the referral. Their phone number is [phone].

## 2023-10-22 ENCOUNTER — Ambulatory Visit
Admission: RE | Admit: 2023-10-22 | Discharge: 2023-10-22 | Disposition: A | Discharge status: Home / Self Care | Source: Ambulatory Visit | Attending: Gastroenterology | Admitting: Gastroenterology

## 2023-10-22 DIAGNOSIS — K862 Cyst of pancreas: Secondary | ICD-10-CM

## 2023-10-22 MED ORDER — GADOPICLENOL 0.5 MMOL/ML IV SOLN
6.0000 mL | Freq: Once | INTRAVENOUS | Status: AC | PRN
  Administered 2023-10-22: 6 mL via INTRAVENOUS

## 2023-11-28 ENCOUNTER — Telehealth: Payer: Self-pay | Admitting: Physician Assistant

## 2023-11-28 NOTE — Telephone Encounter (Signed)
 Pt called back in and left a message. [facility] has not received a referral yet for cognitive and ADHD testing.

## 2023-11-29 ENCOUNTER — Other Ambulatory Visit: Payer: Self-pay

## 2023-11-29 DIAGNOSIS — R413 Other amnesia: Secondary | ICD-10-CM

## 2023-11-29 NOTE — Telephone Encounter (Signed)
 I placed referral  [facility] called patient and I let her know I was sending it.[facility] would not accept her insurance

## 2024-01-15 ENCOUNTER — Telehealth (HOSPITAL_COMMUNITY): Admitting: Psychiatry

## 2024-01-31 ENCOUNTER — Encounter (HOSPITAL_COMMUNITY): Payer: Self-pay | Admitting: Psychiatry

## 2024-01-31 ENCOUNTER — Telehealth (HOSPITAL_BASED_OUTPATIENT_CLINIC_OR_DEPARTMENT_OTHER): Admitting: Psychiatry

## 2024-01-31 VITALS — Wt 117.0 lb

## 2024-01-31 DIAGNOSIS — F4310 Post-traumatic stress disorder, unspecified: Secondary | ICD-10-CM

## 2024-01-31 DIAGNOSIS — F332 Major depressive disorder, recurrent severe without psychotic features: Secondary | ICD-10-CM | POA: Diagnosis not present

## 2024-01-31 DIAGNOSIS — F900 Attention-deficit hyperactivity disorder, predominantly inattentive type: Secondary | ICD-10-CM

## 2024-01-31 DIAGNOSIS — F411 Generalized anxiety disorder: Secondary | ICD-10-CM | POA: Diagnosis not present

## 2024-01-31 MED ORDER — BUPROPION HCL ER (XL) 150 MG PO TB24: ORAL_TABLET | ORAL | 0 refills | Status: DC

## 2024-01-31 MED ORDER — ATOMOXETINE HCL 18 MG PO CAPS: 18.0000 mg | ORAL_CAPSULE | Freq: Two times a day (BID) | ORAL | 0 refills | Status: DC

## 2024-01-31 MED ORDER — VILAZODONE HCL 40 MG PO TABS: 40.0000 mg | ORAL_TABLET | Freq: Every day | ORAL | 0 refills | Status: DC

## 2024-01-31 MED ORDER — PROPRANOLOL HCL 10 MG PO TABS
10.0000 mg | ORAL_TABLET | Freq: Two times a day (BID) | ORAL | 0 refills | Status: DC
Start: 2024-01-31 — End: 2024-04-09
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
## 2024-02-01 ENCOUNTER — Other Ambulatory Visit: Payer: Self-pay | Admitting: Obstetrics and Gynecology

## 2024-02-01 DIAGNOSIS — Z1231 Encounter for screening mammogram for malignant neoplasm of breast: Secondary | ICD-10-CM

## 2024-02-22 ENCOUNTER — Ambulatory Visit

## 2024-02-28 ENCOUNTER — Encounter (HOSPITAL_COMMUNITY): Payer: Self-pay | Admitting: Psychiatry

## 2024-02-28 ENCOUNTER — Telehealth (HOSPITAL_BASED_OUTPATIENT_CLINIC_OR_DEPARTMENT_OTHER): Admitting: Psychiatry

## 2024-02-28 VITALS — Wt 117.0 lb

## 2024-02-28 DIAGNOSIS — F900 Attention-deficit hyperactivity disorder, predominantly inattentive type: Secondary | ICD-10-CM

## 2024-02-28 DIAGNOSIS — F332 Major depressive disorder, recurrent severe without psychotic features: Secondary | ICD-10-CM

## 2024-02-28 DIAGNOSIS — F411 Generalized anxiety disorder: Secondary | ICD-10-CM | POA: Diagnosis not present

## 2024-02-28 DIAGNOSIS — F4310 Post-traumatic stress disorder, unspecified: Secondary | ICD-10-CM | POA: Diagnosis not present

## 2024-02-28 MED ORDER — ATOMOXETINE HCL 25 MG PO CAPS: 25.0000 mg | ORAL_CAPSULE | Freq: Two times a day (BID) | ORAL | 1 refills | Status: DC
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
## 2024-03-08 ENCOUNTER — Ambulatory Visit
Admission: RE | Admit: 2024-03-08 | Discharge: 2024-03-08 | Disposition: A | Discharge status: Home / Self Care | Source: Ambulatory Visit | Attending: Obstetrics and Gynecology | Admitting: Obstetrics and Gynecology

## 2024-03-08 DIAGNOSIS — Z1231 Encounter for screening mammogram for malignant neoplasm of breast: Secondary | ICD-10-CM

## 2024-04-09 ENCOUNTER — Telehealth (HOSPITAL_BASED_OUTPATIENT_CLINIC_OR_DEPARTMENT_OTHER): Admitting: Psychiatry

## 2024-04-09 ENCOUNTER — Encounter (HOSPITAL_COMMUNITY): Payer: Self-pay | Admitting: Psychiatry

## 2024-04-09 VITALS — Wt 117.0 lb

## 2024-04-09 DIAGNOSIS — F332 Major depressive disorder, recurrent severe without psychotic features: Secondary | ICD-10-CM

## 2024-04-09 DIAGNOSIS — F4310 Post-traumatic stress disorder, unspecified: Secondary | ICD-10-CM

## 2024-04-09 DIAGNOSIS — F900 Attention-deficit hyperactivity disorder, predominantly inattentive type: Secondary | ICD-10-CM | POA: Diagnosis not present

## 2024-04-09 DIAGNOSIS — F411 Generalized anxiety disorder: Secondary | ICD-10-CM

## 2024-04-09 MED ORDER — VILAZODONE HCL 40 MG PO TABS: 40.0000 mg | ORAL_TABLET | Freq: Every day | ORAL | 0 refills | Status: DC

## 2024-04-09 MED ORDER — BUPROPION HCL ER (XL) 150 MG PO TB24: ORAL_TABLET | ORAL | 0 refills | Status: DC

## 2024-04-09 MED ORDER — PROPRANOLOL HCL 10 MG PO TABS: 10.0000 mg | ORAL_TABLET | Freq: Two times a day (BID) | ORAL | 0 refills | Status: DC

## 2024-04-09 MED ORDER — ATOMOXETINE HCL 25 MG PO CAPS: 25.0000 mg | ORAL_CAPSULE | Freq: Two times a day (BID) | ORAL | 0 refills | Status: DC
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
## 2024-06-25 ENCOUNTER — Encounter (HOSPITAL_COMMUNITY): Payer: Self-pay | Admitting: Psychiatry

## 2024-06-25 ENCOUNTER — Telehealth (HOSPITAL_COMMUNITY): Admitting: Psychiatry

## 2024-06-25 VITALS — Wt 117.0 lb

## 2024-06-25 DIAGNOSIS — F900 Attention-deficit hyperactivity disorder, predominantly inattentive type: Secondary | ICD-10-CM

## 2024-06-25 DIAGNOSIS — F332 Major depressive disorder, recurrent severe without psychotic features: Secondary | ICD-10-CM

## 2024-06-25 DIAGNOSIS — F338 Other recurrent depressive disorders: Secondary | ICD-10-CM

## 2024-06-25 DIAGNOSIS — F4310 Post-traumatic stress disorder, unspecified: Secondary | ICD-10-CM

## 2024-06-25 DIAGNOSIS — F411 Generalized anxiety disorder: Secondary | ICD-10-CM

## 2024-06-25 MED ORDER — PROPRANOLOL HCL 10 MG PO TABS: 10.0000 mg | ORAL_TABLET | Freq: Two times a day (BID) | ORAL | 0 refills | Status: AC

## 2024-06-25 MED ORDER — VILAZODONE HCL 40 MG PO TABS: 40.0000 mg | ORAL_TABLET | Freq: Every day | ORAL | 0 refills | Status: AC

## 2024-06-25 MED ORDER — ATOMOXETINE HCL 25 MG PO CAPS: 25.0000 mg | ORAL_CAPSULE | Freq: Two times a day (BID) | ORAL | 0 refills | Status: AC

## 2024-06-25 MED ORDER — BUPROPION HCL ER (XL) 150 MG PO TB24: ORAL_TABLET | ORAL | 0 refills | Status: AC
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
## 2024-09-24 ENCOUNTER — Telehealth (HOSPITAL_COMMUNITY): Admitting: Psychiatry
